# Patient Record
Sex: Male | Born: 1955
Health system: Southern US, Community
[De-identification: ages and names within clinical notes are randomized; demographics above are authoritative.]

## PROBLEM LIST (undated history)

## (undated) DIAGNOSIS — F419 Anxiety disorder, unspecified: Secondary | ICD-10-CM

## (undated) DIAGNOSIS — K635 Polyp of colon: Secondary | ICD-10-CM

## (undated) DIAGNOSIS — G8929 Other chronic pain: Secondary | ICD-10-CM

## (undated) DIAGNOSIS — G9332 Myalgic encephalomyelitis/chronic fatigue syndrome: Secondary | ICD-10-CM

## (undated) DIAGNOSIS — K8020 Calculus of gallbladder without cholecystitis without obstruction: Secondary | ICD-10-CM

## (undated) DIAGNOSIS — J302 Other seasonal allergic rhinitis: Secondary | ICD-10-CM

## (undated) DIAGNOSIS — K219 Gastro-esophageal reflux disease without esophagitis: Secondary | ICD-10-CM

## (undated) DIAGNOSIS — F329 Major depressive disorder, single episode, unspecified: Secondary | ICD-10-CM

## (undated) DIAGNOSIS — E291 Testicular hypofunction: Secondary | ICD-10-CM

## (undated) DIAGNOSIS — K5790 Diverticulosis of intestine, part unspecified, without perforation or abscess without bleeding: Secondary | ICD-10-CM

## (undated) DIAGNOSIS — F32A Depression, unspecified: Secondary | ICD-10-CM

## (undated) DIAGNOSIS — F988 Other specified behavioral and emotional disorders with onset usually occurring in childhood and adolescence: Secondary | ICD-10-CM

## (undated) DIAGNOSIS — K589 Irritable bowel syndrome without diarrhea: Secondary | ICD-10-CM

## (undated) DIAGNOSIS — J449 Chronic obstructive pulmonary disease, unspecified: Secondary | ICD-10-CM

## (undated) DIAGNOSIS — R079 Chest pain, unspecified: Secondary | ICD-10-CM

## (undated) DIAGNOSIS — E785 Hyperlipidemia, unspecified: Secondary | ICD-10-CM

## (undated) DIAGNOSIS — I2510 Atherosclerotic heart disease of native coronary artery without angina pectoris: Secondary | ICD-10-CM

## (undated) DIAGNOSIS — K2980 Duodenitis without bleeding: Secondary | ICD-10-CM

## (undated) DIAGNOSIS — E039 Hypothyroidism, unspecified: Secondary | ICD-10-CM

## (undated) DIAGNOSIS — K648 Other hemorrhoids: Secondary | ICD-10-CM

## (undated) DIAGNOSIS — R5382 Chronic fatigue, unspecified: Secondary | ICD-10-CM

## (undated) DIAGNOSIS — S129XXA Fracture of neck, unspecified, initial encounter: Secondary | ICD-10-CM

## (undated) HISTORY — PX: FOOT SURGERY: SHX648

## (undated) HISTORY — DX: Duodenitis without bleeding: K29.80

## (undated) HISTORY — DX: Other specified behavioral and emotional disorders with onset usually occurring in childhood and adolescence: F98.8

## (undated) HISTORY — DX: Atherosclerotic heart disease of native coronary artery without angina pectoris: I25.10

## (undated) HISTORY — DX: Polyp of colon: K63.5

## (undated) HISTORY — PX: CARPAL TUNNEL RELEASE: SHX101

## (undated) HISTORY — DX: Fracture of neck, unspecified, initial encounter: S12.9XXA

## (undated) HISTORY — PX: OTHER SURGICAL HISTORY: SHX169

## (undated) HISTORY — DX: Testicular hypofunction: E29.1

## (undated) HISTORY — DX: Chronic obstructive pulmonary disease, unspecified: J44.9

## (undated) HISTORY — DX: Gastro-esophageal reflux disease without esophagitis: K21.9

## (undated) HISTORY — DX: Diverticulosis of intestine, part unspecified, without perforation or abscess without bleeding: K57.90

## (undated) HISTORY — DX: Chest pain, unspecified: R07.9

## (undated) HISTORY — DX: Other hemorrhoids: K64.8

## (undated) HISTORY — DX: Calculus of gallbladder without cholecystitis without obstruction: K80.20

## (undated) HISTORY — DX: Other seasonal allergic rhinitis: J30.2

## (undated) HISTORY — PX: ANAL FISSURE REPAIR: SHX2312

## (undated) HISTORY — DX: Other chronic pain: G89.29

## (undated) HISTORY — DX: Myalgic encephalomyelitis/chronic fatigue syndrome: G93.32

## (undated) HISTORY — DX: Hypothyroidism, unspecified: E03.9

## (undated) HISTORY — DX: Chronic fatigue, unspecified: R53.82

## (undated) HISTORY — DX: Depression, unspecified: F32.A

## (undated) HISTORY — PX: CERVICAL DISC SURGERY: SHX588

## (undated) HISTORY — DX: Hyperlipidemia, unspecified: E78.5

## (undated) HISTORY — DX: Irritable bowel syndrome, unspecified: K58.9

## (undated) HISTORY — DX: Major depressive disorder, single episode, unspecified: F32.9

---

## 1998-05-02 ENCOUNTER — Ambulatory Visit (HOSPITAL_COMMUNITY): Admission: RE | Admit: 1998-05-02 | Discharge: 1998-05-02 | Payer: Self-pay | Admitting: Neurosurgery

## 1998-05-02 ENCOUNTER — Encounter: Payer: Self-pay | Admitting: Neurosurgery

## 1999-09-02 ENCOUNTER — Emergency Department (HOSPITAL_COMMUNITY): Admission: EM | Admit: 1999-09-02 | Discharge: 1999-09-02 | Payer: Self-pay | Admitting: Emergency Medicine

## 1999-09-02 ENCOUNTER — Encounter: Payer: Self-pay | Admitting: Emergency Medicine

## 1999-11-15 ENCOUNTER — Emergency Department (HOSPITAL_COMMUNITY): Admission: EM | Admit: 1999-11-15 | Discharge: 1999-11-16 | Payer: Self-pay | Admitting: Emergency Medicine

## 1999-11-16 ENCOUNTER — Encounter: Payer: Self-pay | Admitting: *Deleted

## 2000-05-24 ENCOUNTER — Emergency Department (HOSPITAL_COMMUNITY): Admission: EM | Admit: 2000-05-24 | Discharge: 2000-05-24 | Payer: Self-pay | Admitting: Emergency Medicine

## 2000-05-24 ENCOUNTER — Encounter: Payer: Self-pay | Admitting: Emergency Medicine

## 2002-04-23 ENCOUNTER — Inpatient Hospital Stay (HOSPITAL_COMMUNITY): Admission: EM | Admit: 2002-04-23 | Discharge: 2002-04-23 | Payer: Self-pay

## 2002-05-30 ENCOUNTER — Encounter: Payer: Self-pay | Admitting: Endocrinology

## 2002-05-30 ENCOUNTER — Encounter: Admission: RE | Admit: 2002-05-30 | Discharge: 2002-05-30 | Payer: Self-pay | Admitting: Endocrinology

## 2004-02-03 ENCOUNTER — Ambulatory Visit (HOSPITAL_COMMUNITY): Admission: RE | Admit: 2004-02-03 | Discharge: 2004-02-03 | Payer: Self-pay | Admitting: Neurosurgery

## 2004-03-25 ENCOUNTER — Ambulatory Visit (HOSPITAL_COMMUNITY): Admission: RE | Admit: 2004-03-25 | Discharge: 2004-03-25 | Payer: Self-pay | Admitting: Surgery

## 2004-03-25 ENCOUNTER — Encounter (INDEPENDENT_AMBULATORY_CARE_PROVIDER_SITE_OTHER): Payer: Self-pay | Admitting: Specialist

## 2004-03-25 ENCOUNTER — Ambulatory Visit (HOSPITAL_BASED_OUTPATIENT_CLINIC_OR_DEPARTMENT_OTHER): Admission: RE | Admit: 2004-03-25 | Discharge: 2004-03-25 | Payer: Self-pay | Admitting: Surgery

## 2004-05-11 ENCOUNTER — Encounter (INDEPENDENT_AMBULATORY_CARE_PROVIDER_SITE_OTHER): Payer: Self-pay | Admitting: *Deleted

## 2004-05-11 ENCOUNTER — Ambulatory Visit (HOSPITAL_COMMUNITY): Admission: RE | Admit: 2004-05-11 | Discharge: 2004-05-11 | Payer: Self-pay | Admitting: *Deleted

## 2005-03-06 ENCOUNTER — Ambulatory Visit: Payer: Self-pay | Admitting: Cardiology

## 2005-03-06 ENCOUNTER — Inpatient Hospital Stay (HOSPITAL_COMMUNITY): Admission: EM | Admit: 2005-03-06 | Discharge: 2005-03-08 | Payer: Self-pay | Admitting: Obstetrics & Gynecology

## 2005-03-07 ENCOUNTER — Encounter: Payer: Self-pay | Admitting: Cardiology

## 2005-03-11 ENCOUNTER — Encounter: Admission: RE | Admit: 2005-03-11 | Discharge: 2005-03-11 | Payer: Self-pay | Admitting: Endocrinology

## 2005-07-12 ENCOUNTER — Emergency Department (HOSPITAL_COMMUNITY): Admission: EM | Admit: 2005-07-12 | Discharge: 2005-07-12 | Payer: Self-pay | Admitting: Family Medicine

## 2005-09-12 ENCOUNTER — Ambulatory Visit (HOSPITAL_COMMUNITY): Admission: RE | Admit: 2005-09-12 | Discharge: 2005-09-12 | Payer: Self-pay | Admitting: Anesthesiology

## 2006-02-21 HISTORY — PX: LUMBAR DISC SURGERY: SHX700

## 2006-08-01 ENCOUNTER — Emergency Department (HOSPITAL_COMMUNITY): Admission: EM | Admit: 2006-08-01 | Discharge: 2006-08-01 | Payer: Self-pay | Admitting: Emergency Medicine

## 2006-09-02 ENCOUNTER — Encounter: Admission: RE | Admit: 2006-09-02 | Discharge: 2006-09-02 | Payer: Self-pay | Admitting: Neurosurgery

## 2006-11-17 ENCOUNTER — Inpatient Hospital Stay (HOSPITAL_COMMUNITY): Admission: RE | Admit: 2006-11-17 | Discharge: 2006-11-18 | Payer: Self-pay | Admitting: Neurosurgery

## 2008-02-22 LAB — HM COLONOSCOPY

## 2008-12-10 ENCOUNTER — Encounter: Admission: RE | Admit: 2008-12-10 | Discharge: 2008-12-10 | Payer: Self-pay | Admitting: Endocrinology

## 2008-12-18 ENCOUNTER — Encounter: Admission: RE | Admit: 2008-12-18 | Discharge: 2008-12-18 | Payer: Self-pay | Admitting: Endocrinology

## 2009-10-22 ENCOUNTER — Encounter: Admission: RE | Admit: 2009-10-22 | Discharge: 2009-10-22 | Payer: Self-pay | Admitting: Neurosurgery

## 2010-06-03 ENCOUNTER — Encounter: Payer: Self-pay | Admitting: Internal Medicine

## 2010-06-03 ENCOUNTER — Other Ambulatory Visit (INDEPENDENT_AMBULATORY_CARE_PROVIDER_SITE_OTHER): Payer: Medicare Other | Admitting: Internal Medicine

## 2010-06-03 ENCOUNTER — Ambulatory Visit (INDEPENDENT_AMBULATORY_CARE_PROVIDER_SITE_OTHER): Payer: Medicare Other | Admitting: Internal Medicine

## 2010-06-03 ENCOUNTER — Other Ambulatory Visit (INDEPENDENT_AMBULATORY_CARE_PROVIDER_SITE_OTHER): Payer: Medicare Other

## 2010-06-03 VITALS — BP 126/68 | HR 98 | Temp 98.3°F | Ht 67.0 in | Wt 224.2 lb

## 2010-06-03 DIAGNOSIS — R339 Retention of urine, unspecified: Secondary | ICD-10-CM

## 2010-06-03 DIAGNOSIS — Z0000 Encounter for general adult medical examination without abnormal findings: Secondary | ICD-10-CM

## 2010-06-03 DIAGNOSIS — M109 Gout, unspecified: Secondary | ICD-10-CM | POA: Insufficient documentation

## 2010-06-03 DIAGNOSIS — E119 Type 2 diabetes mellitus without complications: Secondary | ICD-10-CM

## 2010-06-03 DIAGNOSIS — R5382 Chronic fatigue, unspecified: Secondary | ICD-10-CM

## 2010-06-03 DIAGNOSIS — E291 Testicular hypofunction: Secondary | ICD-10-CM

## 2010-06-03 DIAGNOSIS — G8929 Other chronic pain: Secondary | ICD-10-CM | POA: Insufficient documentation

## 2010-06-03 DIAGNOSIS — F988 Other specified behavioral and emotional disorders with onset usually occurring in childhood and adolescence: Secondary | ICD-10-CM | POA: Insufficient documentation

## 2010-06-03 DIAGNOSIS — I2510 Atherosclerotic heart disease of native coronary artery without angina pectoris: Secondary | ICD-10-CM

## 2010-06-03 DIAGNOSIS — E039 Hypothyroidism, unspecified: Secondary | ICD-10-CM

## 2010-06-03 DIAGNOSIS — E785 Hyperlipidemia, unspecified: Secondary | ICD-10-CM

## 2010-06-03 DIAGNOSIS — J449 Chronic obstructive pulmonary disease, unspecified: Secondary | ICD-10-CM

## 2010-06-03 DIAGNOSIS — K589 Irritable bowel syndrome without diarrhea: Secondary | ICD-10-CM

## 2010-06-03 DIAGNOSIS — G4710 Hypersomnia, unspecified: Secondary | ICD-10-CM | POA: Insufficient documentation

## 2010-06-03 DIAGNOSIS — J302 Other seasonal allergic rhinitis: Secondary | ICD-10-CM | POA: Insufficient documentation

## 2010-06-03 DIAGNOSIS — Z125 Encounter for screening for malignant neoplasm of prostate: Secondary | ICD-10-CM

## 2010-06-03 DIAGNOSIS — Z86010 Personal history of colonic polyps: Secondary | ICD-10-CM | POA: Insufficient documentation

## 2010-06-03 DIAGNOSIS — R079 Chest pain, unspecified: Secondary | ICD-10-CM | POA: Insufficient documentation

## 2010-06-03 LAB — URINALYSIS, ROUTINE W REFLEX MICROSCOPIC
Ketones, ur: NEGATIVE
Leukocytes, UA: NEGATIVE
Nitrite: NEGATIVE
Specific Gravity, Urine: 1.025 (ref 1.000–1.030)
pH: 5.5 (ref 5.0–8.0)

## 2010-06-03 LAB — MICROALBUMIN / CREATININE URINE RATIO
Creatinine,U: 173.8 mg/dL
Microalb Creat Ratio: 1.7 mg/g (ref 0.0–30.0)
Microalb, Ur: 3 mg/dL — ABNORMAL HIGH (ref 0.0–1.9)

## 2010-06-03 LAB — LIPID PANEL
HDL: 40.8 mg/dL (ref >39.00)
Total CHOL/HDL Ratio: 6
Triglycerides: 418 mg/dL — ABNORMAL HIGH (ref 0.0–149.0)

## 2010-06-03 LAB — CBC WITH DIFFERENTIAL/PLATELET
Eosinophils Relative: 2.7 % (ref 0.0–5.0)
HCT: 40.3 % (ref 39.0–52.0)
Hemoglobin: 14.2 g/dL (ref 13.0–17.0)
Lymphocytes Relative: 33.5 % (ref 12.0–46.0)
Lymphs Abs: 2.3 10*3/uL (ref 0.7–4.0)
Monocytes Relative: 7.6 % (ref 3.0–12.0)
Platelets: 242 10*3/uL (ref 150.0–400.0)
WBC: 7 10*3/uL (ref 4.5–10.5)

## 2010-06-03 LAB — HEPATIC FUNCTION PANEL
AST: 91 U/L — ABNORMAL HIGH (ref 0–37)
Albumin: 4 g/dL (ref 3.5–5.2)

## 2010-06-03 LAB — HEMOGLOBIN A1C: Hgb A1c MFr Bld: 8.4 % — ABNORMAL HIGH (ref 4.6–6.5)

## 2010-06-03 LAB — BASIC METABOLIC PANEL
CO2: 25 mEq/L (ref 19–32)
Potassium: 4 mEq/L (ref 3.5–5.1)
Sodium: 136 mEq/L (ref 135–145)

## 2010-06-03 LAB — TSH: TSH: 2.53 u[IU]/mL (ref 0.35–5.50)

## 2010-06-03 LAB — PSA: PSA: 0.33 ng/mL (ref 0.10–4.00)

## 2010-06-03 MED ORDER — TAMSULOSIN HCL 0.4 MG PO CAPS: 0.4000 mg | ORAL_CAPSULE | Freq: Every day | ORAL | Status: DC

## 2010-06-03 MED ORDER — TETANUS-DIPHTH-ACELL PERTUSSIS 5-2.5-18.5 LF-MCG/0.5 IM SUSP
0.5000 mL | Freq: Once | INTRAMUSCULAR | Status: AC
  Administered 2010-06-03: 0.5 mL via INTRAMUSCULAR
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
## 2010-06-03 NOTE — Assessment & Plan Note (Signed)
?   OSA - for pulm eval

## 2010-06-03 NOTE — Assessment & Plan Note (Signed)
Last testosterone level low per pt - for re-check today

## 2010-06-03 NOTE — Patient Instructions (Addendum)
Please call for referral for GI evaluation prior to colonoscopy if you are not contacted by Dr Loletta Specter of [facility] Take all new medications as prescribed - the generic flomax for the prostate  Continue all other medications as before You had the tetanus shot today Please go to LAB in the Basement for the blood and/or urine tests to be done today Please call the number on the [facility] (the [employer] [facility]) for results of testing in 2-3 days You will be contacted regarding the referral for: pulmonary for possible sleep apnea Please return in 6 mo with Lab testing done 3-5 days before

## 2010-06-03 NOTE — Assessment & Plan Note (Signed)
Last a1c per endo borderline elev - stable overall by hx and exam, most recent lab reviewed with pt, and pt to continue medical treatment as before  , for lab f/u today

## 2010-06-03 NOTE — Assessment & Plan Note (Signed)
Overall doing well, age appropriate education and counseling updated, referrals for preventative services and immunizations addressed, dietary and smoking counseling addressed, most recent labs and ECG reviewed.  I have personally reviewed and have noted: 1) the patient's medical and social history 2) The pt's use of alcohol, tobacco, and illicit drugs 3) The patient's current medications and supplements 4) Functional ability including ADL's, fall risk, home safety risk, hearing and visual impairment 5) Diet and physical activities 6) Evidence for depression or mood disorder 7) The patient's height, weight, and BMI have been recorded in the chart I have made referrals, and provided counseling and education based on review of the above For tetanus update today

## 2010-06-03 NOTE — Assessment & Plan Note (Signed)
prob bph most likely, for ua and psa above, also for trial flomax

## 2010-06-04 ENCOUNTER — Other Ambulatory Visit: Payer: Self-pay

## 2010-06-04 LAB — TESTOSTERONE, FREE, TOTAL, SHBG: Testosterone: 112.5 ng/dL — ABNORMAL LOW (ref 250–890)

## 2010-06-04 MED ORDER — PRAVASTATIN SODIUM 40 MG PO TABS: 40.0000 mg | ORAL_TABLET | Freq: Every evening | ORAL | Status: DC

## 2010-06-04 MED ORDER — PIOGLITAZONE HCL 30 MG PO TABS: 30.0000 mg | ORAL_TABLET | Freq: Every day | ORAL | Status: DC

## 2010-06-22 ENCOUNTER — Encounter: Payer: Self-pay | Admitting: Internal Medicine

## 2010-06-24 ENCOUNTER — Encounter: Payer: Self-pay | Admitting: Pulmonary Disease

## 2010-06-24 ENCOUNTER — Ambulatory Visit (INDEPENDENT_AMBULATORY_CARE_PROVIDER_SITE_OTHER): Payer: Medicare Other | Admitting: Pulmonary Disease

## 2010-06-24 VITALS — BP 118/66 | HR 78 | Temp 98.4°F | Ht 67.0 in | Wt 226.0 lb

## 2010-06-24 DIAGNOSIS — G4710 Hypersomnia, unspecified: Secondary | ICD-10-CM

## 2010-06-24 NOTE — Patient Instructions (Signed)
Will set up for a sleep study, and arrange followup once results available

## 2010-06-24 NOTE — Progress Notes (Signed)
  Subjective:    Patient ID: Stephen Herrera, male    DOB: 1955/04/15, 55 y.o.   MRN: [id number]  HPI The pt is a 55y/o male who I have been asked to see for possible osa.  His history is significant for: -snoring, but no one has mentioned an abnormal breathing pattern during sleep. Denies gasping arousals -very fragmented and restless sleep, nonrestorative sleep. -sleep pressure noted during day with periods of inactivity -takes catnap in am's of 1-2 hours -dozes with tv and inactivity in the evening hours.  -weight up 10 pounds past 2 yrs, epworth 15  Sleep Questionnaire:   What time do you typically go to bed?( Between what hours)  11 pm to 2 am         How long does it take you to fall asleep?  10 mins         How many times during the night do you wake up?           What time do you get out of bed to start your day?  0800         Do you drive or operate heavy machinery in your occupation?  No         How much has your weight changed (up or down) over the past two years? (In pounds)  10 lb (4.536 kg)         Have you ever had a sleep study before?   No         Do you currently use CPAP?  No         Do you wear oxygen at any time?  No              Review of Systems  Constitutional: Negative for fever and unexpected weight change.  HENT: Negative for ear pain, nosebleeds, congestion, sore throat, rhinorrhea, sneezing, trouble swallowing, dental problem, postnasal drip and sinus pressure.   Eyes: Negative for redness and itching.  Respiratory: Positive for shortness of breath. Negative for cough, chest tightness and wheezing.   Cardiovascular: Negative for palpitations and leg swelling.  Gastrointestinal: Negative for nausea and vomiting.  Genitourinary: Negative for dysuria.  Musculoskeletal: Positive for joint swelling.  Skin: Negative for rash.  Neurological: Negative for headaches.  Hematological: Does not bruise/bleed easily.  Psychiatric/Behavioral: Negative for dysphoric  mood. The patient is not nervous/anxious.        Objective:   Physical Exam Constitutional:  Well developed, no acute distress  HENT:  Nares with deviated septum to left with near obstruction.  Right narrowed also  Oropharynx without exudate, palate and uvula are elongated  Eyes:  Perrla, eomi, no scleral icterus  Neck:  No JVD, no TMG  Cardiovascular:  Normal rate, regular rhythm, no rubs or gallops.  No murmurs        Intact distal pulses  Pulmonary :  Normal breath sounds, no stridor or respiratory distress   No rales, rhonchi, or wheezing  Abdominal:  Soft, nondistended, bowel sounds present.  No tenderness noted.   Musculoskeletal:  No lower extremity edema noted.  Lymph Nodes:  No cervical lymphadenopathy noted  Skin:  No cyanosis noted  Neurologic:  Alert, appropriate, moves all 4 extremities without obvious deficit.        Assessment & Plan:

## 2010-06-28 ENCOUNTER — Encounter: Payer: Self-pay | Admitting: Pulmonary Disease

## 2010-06-28 NOTE — Assessment & Plan Note (Signed)
The pt has significant daytime hypersomnia by his history, and describes very restless and nonrestorative sleep.  He does snore, is overweight, and has an abnormal upper airway anatomy.  This is most suggestive of sleep disordered breathing, although there are many other things which could do the same thing.  At this point, the pt feels it interferes with his QOL, and will need a sleep study for work-up.
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
## 2010-07-06 NOTE — Op Note (Signed)
NAMEANOOP, HEMMER                ACCOUNT NO.:  [id number]   MEDICAL RECORD NO.:  [id number]          PATIENT TYPE:  INP   LOCATION:  3020                         FACILITY:  [facility]   PHYSICIAN:  Payton Doughty, M.D.      DATE OF BIRTH:  1955-12-12   DATE OF PROCEDURE:  11/17/2006  DATE OF DISCHARGE:                               OPERATIVE REPORT   PREOPERATIVE DIAGNOSIS:  Herniated disk, L4-5, on the left.   POSTOPERATIVE DIAGNOSIS:  Herniated disk, L4-5, on the left.   OPERATIVE PROCEDURE:  Left L4-5 laminectomy and diskectomy.   SURGEON:  Payton Doughty, M.D.   ANESTHESIA:  General endotracheal.   PREP:  Sterile Betadine prep and scrub with alcohol wipe.   COMPLICATIONS:  None.   NURSE ASSISTANCE:  [place].   DOCTOR ASSISTANCE:  Hilda Lias, M.D.   BODY OF TEXT:  A 55 year old gentleman with a left L5 radiculopathy  related to disk.  He was taken to operative room and smoothly  anesthetized and intubated, placed prone on the operating table.  Following shave, prep, and drape in usual sterile fashion, skin was  infiltrated with 1% lidocaine with 1:400,000 epinephrine.  Incision was  made over the L4 lamina.  It was dissected in subperiosteal plane.  Intraoperative x-rays showed marker under L3.  Laminectomy was carried  out at the next level below that.  McCullough retractor was placed, and  the high-speed drill was used to create a hemi-semi-laminectomy up the  top of ligamentum flavum.  It was removed in retrograde fashion.  The L5  root was identified and retracted medially, exposing the disk space.  The annular fibers were divided and the disk removed.  This resulted in  decompression of 5 root as it traversed this area.  All graspable  fragments were removed and the end plates gently curetted to remove any  marginally clinging fragments.  Following complete decompression and  exploration of the neural foramina, wound was irrigated and hemostasis  assured.  The  laminectomy defect was filled with Depo-Medrol soaked fat.  Successive layers of 0 Vicryl, 2-0 Vicryl, and 4-0 Vicryl were used to  close.  Benzoin and Steri-Strips were placed and made occlusive with  Telfa and OpSite.  The patient returned to the recovery room in good  condition.    .           ______________________________  Payton Doughty, M.D.     MWR/MEDQ  D:  11/17/2006  T:  11/17/2006  Job:  [id number]

## 2010-07-09 NOTE — Op Note (Signed)
Stephen Herrera, Stephen Herrera                ACCOUNT NO.:  [id number]   MEDICAL RECORD NO.:  [id number]          PATIENT TYPE:  AMB   LOCATION:  DAY                          [facility]   PHYSICIAN:  Vikki Ports, MDDATE OF BIRTH:  1955/08/22   DATE OF PROCEDURE:  05/11/2004  DATE OF DISCHARGE:                                 OPERATIVE REPORT   PREOPERATIVE DIAGNOSES:  1.  Fistula in ano.  2.  Internal hemorrhoids with prolapse.   POSTOPERATIVE DIAGNOSES:  1.  Fistula in ano.  2.  Internal hemorrhoids with prolapse.   PROCEDURE:  Fistulotomy and procedure for prolapsing hemorrhoids, rectopexy.   SURGEON:  Vikki Ports, M.D.   ANESTHESIA:  General anesthesia.   DESCRIPTION OF PROCEDURE:  Patient was taken to the operating room and  placed in the supine position.  After adequate general anesthesia was  induced using endotracheal tube, patient was placed in the prone jackknife  position.  Rectal and perianal prep were undertaken.  The fistula tract  which was in the anterior midline was identified, was very superficial and  not under the sphincter muscle.  It was probed and sphincterotomy was  performed using Bovie electrocautery.  Adequate hemostasis was insured.  Area of the chronic inflammation was incised of the anoderm.   I then turned my attention to the internal rectal mucosa and a 2-0 Prolene  pursestring stitch was placed approximately 5 cm proximal to the dentate  line.  PPH stapler was placed in the rectum.  Pursestring was tied down. The  stapler was closed, held in place for one minute and fired.  I had a good  rim of 2 cm of hemorrhoidal tissue.  No evidence of muscularis.  Staple line  was inspected.  There was no bleeding.  Gelfoam packing was placed.  Internal and external sphincters were injected with 0.5 Marcaine as was the  fistulotomy site.  The patient tolerated the procedure well and went to PACU  in good condition.      KRH/MEDQ  D:   05/11/2004  T:  05/11/2004  Job:  [id number]

## 2010-07-09 NOTE — H&P (Signed)
NAME:  Stephen Herrera, Stephen Herrera NO.:  [id number]   MEDICAL RECORD NO.:  [id number]          PATIENT TYPE:  INP   LOCATION:  1404                         FACILITY:  [facility]   PHYSICIAN:  Lorain Childes, M.D. LHCDATE OF BIRTH:  06-25-1955   DATE OF ADMISSION:  03/06/2005  DATE OF DISCHARGE:                                HISTORY & PHYSICAL   PRIMARY CARE PHYSICIAN:  __________   CARDIOLOGIST:  Rollene Rotunda, M.D.   CHIEF COMPLAINT:  Chest pain.   HISTORY OF PRESENT ILLNESS:  The patient is a 55 year old gentleman with a  history of nonobstructing coronary disease who presents to the ER for  evaluation of chest pain. The patient reports the onset of chest pain at 6  p.m. today.  He describes it as sharp and __________ on the right side of  his chest radiating __________ his chest. He reports associated shortness of  breath; denies any nausea, vomiting or diaphoresis, palpitations, or  syncope. He reports that the pain was 5/10 and persistent so he came to the  emergency room for further evaluation. He received three sublingual  nitroglycerins with no relief of his pain and was started on a nitroglycerin  drip. His pain is now a 2/10 to 3/10 currently. He denies any shortness of  breath. He denies any recent chest discomfort prior to today. His last  episode of chest pain was a couple of years ago for which he underwent a  cardiac catheterization and was found to have nonobstructing coronary  disease. He denies any recent upper respiratory symptoms, no coughing. No  recent travel and no lower extremity edema. No worsening of his  gastroesophageal reflux symptoms. He reports that his pain is similar to  prior episodes for which he underwent a cardiac catheterization, however,  this time it did not radiate to his arm.   PAST MEDICAL HISTORY:  1.  Chest pain, status post cardiac catheterization on April 23, 2002. He was      found to have left main was normal, LAD had 25%  stenosis, his circumflex      was normal, his RCA had minor irregularities proximally. His EF was      noted to be 65%.  2.  Dyslipidemia with hypertriglyceridemia.  3.  Diabetes.  4.  Tobacco abuse, ongoing.  5.  Spinal stenosis with chronic back pain.  6.  External hemorrhoids, status post surgery.  7.  Status post right carpal tunnel release surgery.  8.  Status post anal fistulotomy and repair of prolapsed hemorrhoids.   MEDICATIONS:  1.  Vicodin 10/650 mg 1 tablet p.o. q.8 h. p.r.n.  2.  Nexium.  3.  TriCor unknown dose.  4.  Crestor 5 mg daily.  5.  Actos unknown dose.  6.  Aspirin 81 mg daily.   ALLERGIES:  HE HAS NO KNOWN DRUG ALLERGIES.   SOCIAL HISTORY:  He lives in [place]. He is divorced. He has a 30-pack-  year history of smoking which is ongoing. He denies any alcohol. No drugs.  No herbal medications. He follows a very low-cholesterol diet.  He does do  some exercise with walking.   FAMILY HISTORY:  His mother had a brain tumor which was found to be cancer.  Father died from complications related to diabetes, he had a CABG in his  52s. His siblings are healthy without any coronary disease.   REVIEW OF SYSTEMS:  He denies any fevers, chills. No sweats, no weight  changes. He has occasional headaches which are relieved with Tylenol. No  visual changes. No hearing loss. No focal weakness. No numbness. No urinary  symptoms. No nausea, vomiting, diarrhea. No bright-red blood per rectum, no  melena. No change in his bowel habits. All other systems are negative.   PHYSICAL EXAMINATION:  VITAL SIGNS:  Temperature 97.5, pulse 69, respiration  20, blood pressure 115/61, he is saturating 96% on room air.  GENERAL:  He is a pleasant man, comfortable, in no acute distress.  HEENT:  Normocephalic, atraumatic. Oropharynx is clear.  NECK:  His JVP is approximately 6 cm. He has 2+ carotid upstroke. There are  no bruits.  CARDIOVASCULAR:  He has a normal S1, split S2, regular  rate and rhythm. He  has no murmurs appreciated. His PMI is non-displaced. His pulses are 2+  throughout and equal. There are no bruits.  LUNGS:  Clear to auscultation bilaterally; no wheezing, rales or rhonchi.  ABDOMEN:  Soft, positive bowel sounds, nontender, no organomegaly.  EXTREMITIES:  Have no edema. He has 2+ distal pulses.  NEUROLOGIC:  Nonfocal.   Chest x-ray shows no acute airspace disease. EKG shows a rate of 83, sinus  rhythm, normal axis, normal intervals, he has a Q wave noted in lead III and  a very slight one noted in aVF. He has no ST changes, no ischemic changes,  no hypertrophy. He has no change from prior EKG dated February 02, 2004.   LABORATORY:  His white count is 7.5, hematocrit 42.7, platelets 242.  Potassium 3.9, creatinine 1.0, glucose 93. His CK-MB is 1.4, troponin less  than 0.05, myoglobin is 51.9.   ASSESSMENT AND PLAN:  The patient is a 55 year old gentleman with  nonobstructive coronary disease, who presents for evaluation of chest pain.   Problem 1.  Chest pain. It is atypical in nature for cardiac etiology,  however, he does have risk factors and could have had progression of his  disease since his last catheterization about 3 years ago. We will monitor  him on telemetry, cycle his cardiac enzymes and follow his EKG. Will plan  for a stress test tomorrow. For medical management we will treat him with  Lovenox, continue his nitroglycerin drip for now, will start a beta-blocker  and treat his lipids. I have strongly encouraged smoking cessation and will  place a consult for this. Lastly, we will check a D-dimer to assess for  possible pulmonary embolus as a cause of his atypical chest pain.   Problem 2.  Gastroesophageal reflux disease. We will continue him on a  proton pump inhibitor. We will start Protonix during the hospitalization.  Problem 3.  Diabetes. We will continue him on his Actos and cover him with  sliding scale insulin.   Problem 4.   Dyslipidemia. I have written him for a statin and TriCor. His  dose does need to be clarified as he does not know these currently.           ______________________________  Lorain Childes, M.D. [facility]     CGF/MEDQ  D:  03/06/2005  T:  03/07/2005  Job:  [id number]

## 2010-07-09 NOTE — Discharge Summary (Signed)
NAME:  Stephen Herrera, Stephen Herrera                ACCOUNT NO.:  [id number]   MEDICAL RECORD NO.:  [id number]          PATIENT TYPE:  INP   LOCATION:  1404                         FACILITY:  [facility]   PHYSICIAN:  Thomas C. Wall, M.D.   DATE OF BIRTH:  1955/10/10   DATE OF ADMISSION:  03/06/2005  DATE OF DISCHARGE:  03/08/2005                                 DISCHARGE SUMMARY   PRIMARY CARDIOLOGIST:  Rollene Rotunda, M.D.   PRINCIPAL DIAGNOSIS:  1.  Noncardiac chest pain.      1.  Normal exercise stress Myoview, ejection fraction 58%.   SECONDARY DIAGNOSES:  1.  Nonobstructive coronary artery disease.      1.  Cardiac catheterization, March, 2004.      2.  Normal left ventricular function.  2.  Mixed dyslipidemia.  3.  Type 2 diabetes mellitus.  4.  Ongoing tobacco.  5.  Spinal stenosis/chronic back pain.   PROCEDURES:  Exercise stress Myoview, March 15th.   REASON FOR ADMISSION:  Patient is a 54 year old male with a history of  nonobstructive CAD who presented to the emergency room with a complaint of  sharp right-sided chest pain.   HOSPITAL COURSE:  The patient ruled out for myocardial infarction with all  serial cardiac markers within normal limits.  Additionally, D-dimer was  normal.  The patient was referred for an exercise stress Myoview, which  revealed no exercise-induced ischemia, EF 58%.   The patient was treated with analgesics during his brief stay and discharged  home on Advil.  He was to otherwise resume all previous home medications.   Of note, at the time of discharge, Dr. Daleen Squibb suggested consideration of a CT  scan of the chest if the patient were to have persistent chest pain.   LABORATORY DATA:  Normal serial cardiac markers, D-dimer less than 0.22.  BNP less than 30.  Lipid profile:  Total cholesterol 226, triglycerides 438,  HDL 31, LDL not calculated, TSH 2.41.  CBC normal.  Electrolytes, renal  function, and liver enzymes normal.  Serum glucose 101, hemoglobin A1C  5.9.   ADMISSION CHEST X-RAY:  NAD.   DISCHARGE MEDICATIONS:  1.  Aspirin 81 daily.  2.  Crestor 5 daily.  3.  Actos as previously directed.  4.  Tricor as previously directed.  5.  Nexium 1 tablet daily.  6.  Vicodin 10/650 p.r.n.  7.  Advil 400-600 mg q.i.d. p.r.n.   FOLLOW UP:  Patient is instructed to arrange followup with Dr. Corrin Parker.   DISCHARGE DURATION TIME:  Forty minutes.      Gene Serpe, P.A. [facility]      Thomas C. Wall, M.D.  Electronically Signed    GS/MEDQ  D:  05/12/2005  T:  05/13/2005  Job:  [id number]   cc:   Alfonse Alpers. Dagoberto Ligas, M.D.  Fax: [phone]

## 2010-07-09 NOTE — Op Note (Signed)
NAME:  Stephen Herrera, Stephen Herrera                ACCOUNT NO.:  [id number]   MEDICAL RECORD NO.:  [id number]          PATIENT TYPE:  AMB   LOCATION:  NESC                         FACILITY:  [facility]   PHYSICIAN:  Currie Paris, M.D.DATE OF BIRTH:  1955-07-14   DATE OF PROCEDURE:  DATE OF DISCHARGE:                                 OPERATIVE REPORT   OFFICE MEDICAL RECORD NUMBER:  [phone]   PREOPERATIVE DIAGNOSES:  1.  Residual thrombosed external hemorrhoid and skin tag.  2.  Hemorrhoids.   POSTOPERATIVE DIAGNOSES:  1.  Residual thrombosed external hemorrhoid and skin tag.  2.  Hemorrhoids.   OPERATION:  Examination under anesthesia with excision of residual  thrombosed hemorrhoids/skin tag.   SURGEON:  Currie Paris, M.D.   ASSISTANT:  Kizzie Furnish, P.A. student   ANESTHESIA:  General.   CLINICAL HISTORY:  This patient had a thrombosed hemorrhoid drained in the  office several weeks ago and has a residual area that has continued to  drain.  On exam, there was still what appeared to be a residual portion of  old clot and skin tag.  The patient was having significant problems keeping  this area clean, and we decided to excise that.   DESCRIPTION OF PROCEDURE:  The patient was taken to the holding area.  He  had no further questions.  He was taken to the operating room.  After  satisfactory general anesthesia was obtained, he was placed in the lithotomy  position.  The perineal area was prepped and draped.  A timeout was done.   The anoscope was introduced, and he did have fairly significant internal  hemorrhoid disease.  However, this so far has not been symptomatic.  He had  an external skin tag with, I think, a little bit of necrotic tissue within  it from where the thrombosed hemorrhoid was anteriorly.  I injected about 10  mL of 0.25% plain Marcaine in the area where the skin tag was and into the  sphincter muscle.  We waited about 10 minutes, and then I did an  elliptical  incision to excise the skin tag and a little bit of the anal mucosa.  Bleeding was controlled with cautery, and this  incision was closed with running 4-0 Vicryl.  Everything appeared to be dry.  A little packing was placed in the rectum with Gelfoam, and sterile dressing  was applied.  The patient tolerated the procedure well.  There were no  operative complications.  All counts were correct.      CJS/MEDQ  D:  03/25/2004  T:  03/25/2004  Job:  [id number]   cc:   Alfonse Alpers. Dagoberto Ligas, M.D.  [address]  [place]  [place] [address]  Fax: [phone]

## 2010-07-09 NOTE — Cardiovascular Report (Signed)
   NAME:  Stephen Herrera, Stephen Herrera                          ACCOUNT NO.:  [id number]   MEDICAL RECORD NO.:  [id number]                   PATIENT TYPE:  INP   LOCATION:  6533                                 FACILITY:  [facility]   PHYSICIAN:  Rollene Rotunda, M.D. [facility]            DATE OF BIRTH:  1955/03/24   DATE OF PROCEDURE:  04/23/2002  DATE OF DISCHARGE:                              CARDIAC CATHETERIZATION   PRIMARY CARE PHYSICIAN:  Alfonse Alpers. Dagoberto Ligas, M.D.   PROCEDURE:  Left heart catheterization/coronary arteriography.   INDICATIONS:  Evaluate patient with chest pain consistent with unstable  angina and significant cardiovascular risk factors.   DESCRIPTION OF PROCEDURE:  Left heart catheterization was performed via the  right femoral artery. The artery was cannulated using the anterior wall  puncture. A #6 French arterial sheath was inserted via the modified  Seldinger technique. Preformed Judkins and a pigtail catheter were utilized.  The patient tolerated the procedure well and left the lab in stable  condition.   HEMODYNAMICS:  LV 118/14, AO 116/85.   CORONARY ARTERIES:  1. The left main was normal.  2. The LAD had mid 25% stenosis.  3. The circumflex included a very large mid obtuse marginal.  It was normal.  4. The right coronary artery had proximal luminal irregularities.  The right     coronary was dominant.   LEFT VENTRICULOGRAM:  A left ventriculogram was obtained in the RAO  projection.  The EF was 65%.   CONCLUSIONS:  Minimal coronary artery plaque.  Normal left ventricular  function.   PLAN:  No further cardiac work-up is suggested.  The patient will follow  with Dr. Dagoberto Ligas for primary risk reduction.   Of note, the patient had vascular hemostasis achieved via the Matrix  protocol.  Polymer gel was used for hemostasis and was done without  complications after the patient was signed appropriately informed consent.         Rollene Rotunda, M.D. [facility]    JH/MEDQ  D:  04/23/2002  T:  04/24/2002  Job:  [id number]   cc:   Alfonse Alpers. Dagoberto Ligas, M.D.  [address], [address]  [place]  [place] [address]  Fax: [phone]

## 2010-07-09 NOTE — H&P (Signed)
NAME:  [name], [name]                          ACCOUNT NO.:  [id number]   MEDICAL RECORD NO.:  [id number]                   PATIENT TYPE:  INP   LOCATION:  6533                                 FACILITY:  [facility]   PHYSICIAN:  Jesse Sans. Wall, M.D. [facility]            DATE OF BIRTH:  1956/01/25   DATE OF ADMISSION:  04/22/2002  DATE OF DISCHARGE:                                HISTORY & PHYSICAL   CHIEF COMPLAINT:  Aching in my shoulder and arm while watching television  with pressure and tightness in my chest and breaking out in a sweat.   HISTORY OF PRESENT ILLNESS:  Mr. Stephen Herrera is a 55 year old white male who  presents with the above complaints.  When he got to the emergency room,  after about an hour and a half from the onset of pain, nitroglycerin  relieved it.  He has had some reflux symptoms in the past.  He takes p.r.n.  Zantac.  He said this was not his reflux.   His cardiac risk factors include male sex, age, hyperlipidemia with  triglyceride counts in the 700s, question of borderline diabetes and tobacco  use for 22 years, which he quit New Year's Day.  He does not exercise.  He  is overweight.   PAST MEDICAL HISTORY:  He has history of spinal stenosis and takes p.r.n.  Vicodin for this.   ALLERGIES:  No known drug allergies.   PAST SURGICAL HISTORY:  He has had no previous surgeries.   FAMILY HISTORY:  Remarkable for his father having coronary bypass surgery in  his 8s.   REVIEW OF SYSTEMS:  Other than the HPI, negative.  He has no GI complaints,  no melena, hematochezia, and hematemesis.   PHYSICAL EXAMINATION:  VITAL SIGNS:  His exam this morning shows a blood  pressure of 110/65, pulse 82 and regular, respirations 20 and unlabored, O2  saturation is 96% on room air, he is afebrile.  GENERAL:  He is a very pleasant gentleman currently in no acute distress.  He is on IV nitroglycerin and heparin.  HEENT:  Unremarkable.  NECK:  Carotid upstrokes are equal bilaterally  without bruits.  No JVD.  Thyroid is not enlarged.  Trachea midline.  HEART:  Nondisplaced PMI.  Normal S1 and S2 without murmur, rub, or gallop.  LUNGS:  Clear to auscultation and percussion.  ABDOMEN:  Soft, good bowel sounds.  No midline bruit.  No tenderness.  EXTREMITIES:  Good distal pulses, 3+/4+.  There is no cyanosis, clubbing or  edema.  NEUROLOGIC:  Intact.   LABORATORY DATA:  His baseline laboratory data show glucose 140 which is  nonfasting, BUN 25, creatinine 1.2, sodium 136, potassium 3.8.  CBC is  normal.  His first troponin and CPK-MB are negative.  I do not see a protime  or PTT which were ordered.   EKG is essentially normal.   Chest x-ray was  done but no report in the chart.  He is now up on 6500 and I  will have the report generated.   ASSESSMENT:  1. Chest discomfort consistent with unstable angina or coronary ischemia.     He has multiple risk factors.  Cardiac catheterization has been     recommended and explained.  He agrees to proceed today with left     __________ possible.  2. Check lipid panel in the morning.  3. Check PT/PTT before catheterization.  4. Obtain chest x-ray.  5. Fasting blood sugar, hemoglobin A1c in the morning.  6. Notify Dr. Dagoberto Ligas.                                               Thomas C. Daleen Squibb, M.D. [facility]    TCW/MEDQ  D:  04/23/2002  T:  04/23/2002  Job:  [id number]   cc:   Alfonse Alpers. Dagoberto Ligas, M.D.  [address], [address]  [place]  [place] [address]  Fax: [phone]

## 2010-07-09 NOTE — Discharge Summary (Signed)
NAME:  DREDEN, RIVERE                          ACCOUNT NO.:  [id number]   MEDICAL RECORD NO.:  [id number]                   PATIENT TYPE:  INP   LOCATION:  2854                                 FACILITY:  [facility]   PHYSICIAN:  Rollene Rotunda, M.D. [facility]            DATE OF BIRTH:  1955-06-06   DATE OF ADMISSION:  04/22/2002  DATE OF DISCHARGE:  04/23/2002                           DISCHARGE SUMMARY - REFERRING   BRIEF HISTORY:  The patient is a 55 year old male who presented with aching  in his shoulder and arm, developing chest pressure and tightness, associated  with diaphoresis while watching TV.  He decided to come to the emergency  room after about an hour and one-half, and he received nitroglycerin which  relieved the discomfort.  He also describes some reflux symptoms he has had  in the past and feels that these symptoms are not similar to his reflux.  His history is notable for hyperlipidemia with triglycerides count in the  700s, questionable borderline diabetes, obesity, and tobacco use which he  quit New Year's day.   LABORATORY DATA:  In the ER, hemoglobin 15, hematocrit 45.  Sodium 136,  potassium 3.8, BUN 25, creatinine 1.2.  CK 103, MB 1.7, troponin 0.02.   EKG shows normal sinus rhythm, nonspecific ST-T wave changes.   HOSPITAL COURSE:  The patient was admitted to 5500 by Dr. Daleen Squibb.  It was felt  the patient should undergo cardiac catheterization.  On the morning of  04/23/2002, Dr. Antoine Poche performed cardiac catheterization which revealed  normal left main, 25% LAD, large OM, proximal luminal irregularities, and  RCA which was dominant.  His EF was 65%.  It was felt he had minimal  coronary artery plaque with normal LV function and did not need any further  cardiac workup.  Dr. Antoine Poche suggested he follow up with Dr. Dagoberto Ligas in  regard to the lipids and hemoglobin A1C that are pending at the time of this  dictation.   DISCHARGE DIAGNOSES:  1. Noncardiac chest  discomfort.  2. Multiple cardiac risk factors as previous described.   DISPOSITION:  The patient is discharged home.  He will follow up on  Thursday, April 1, at 2 p.m. at [facility] because he  is entered into the [employer] study.  He as asked to call to make one  to two-week followup appointment with Dr. Dagoberto Ligas in regard to his lipids and  hemoglobin A1C that are still pending at the time of discharge.  He was  advised no lifting, driving, sexual activity, or heavy exertion for two  days.  Maintain low-fat, low-salt, low-cholesterol diet.  If he had any  problems with his catheterization site, he was asked to call us immediately.    ADDENDUM:  Prior to discharge, fasting lipids were completed.  They were  noted to be total cholesterol 179, triglycerides 457, HDL 38, LDL not  calculated.  Hemoglobin A1C  was also 5.8.     Joellyn Rued, P.A. [facility]                    Rollene Rotunda, M.D. [facility]    EW/MEDQ  D:  04/23/2002  T:  04/23/2002  Job:  [id number]   cc:   Alfonse Alpers. Dagoberto Ligas, M.D.  [address], [address]  [place]  [place] [address]  Fax: [phone]

## 2010-07-09 NOTE — Op Note (Signed)
NAMEEUSTACE, HUR NO.:  [id number]   MEDICAL RECORD NO.:  [id number]          PATIENT TYPE:  OIB   LOCATION:  2899                         FACILITY:  [facility]   PHYSICIAN:  Payton Doughty, M.D.      DATE OF BIRTH:  19-Feb-1956   DATE OF PROCEDURE:  02/03/2004  DATE OF DISCHARGE:                                 OPERATIVE REPORT   PREOPERATIVE DIAGNOSIS:  Right carpal tunnel syndrome.   POSTOPERATIVE DIAGNOSIS:  Right carpal tunnel syndrome.   OPERATION PERFORMED:  Right carpal tunnel release.   SURGEON:  Payton Doughty, M.D.   ANESTHESIA:  1% local lidocaine.   COMPLICATIONS:  None.   ASSISTANT:  None.   INDICATIONS FOR PROCEDURE:  The patient is a 55 year old gentleman with  right carpal tunnel syndrome.   DESCRIPTION OF PROCEDURE:  The patient was taken to the operating room.  After shave, prep and drape in the usual sterile fashion had 1% local  lidocaine injection paralleling between the second and third rays of the  right hand 1 cm distal to the distal wrist crease.  Incision was made over  this area.  Palmar fat was dissected and the transverse carpal ligament  identified.  It was divided sharply down to the level of the nerve.  Working  just proximally along the wrist and distally on the hand, the transverse  carpal ligament was divided to allow complete decompression of the right  medial nerve.  The tunnel was quite tight, thickened, particularly right at  the wrist crease.  Following complete release the wound was irrigated and  hemostasis assured and it was closed in a single layer with 3-0 nylon in  interrupted vertical mattress fashion.  Patient was placed in standard hand  wrap and returned to recovery room.       MWR/MEDQ  D:  02/03/2004  T:  02/03/2004  Job:  [id number]

## 2010-07-15 ENCOUNTER — Other Ambulatory Visit: Payer: Self-pay | Admitting: Internal Medicine

## 2010-07-15 DIAGNOSIS — E291 Testicular hypofunction: Secondary | ICD-10-CM

## 2010-07-16 ENCOUNTER — Ambulatory Visit
Admission: RE | Admit: 2010-07-16 | Discharge: 2010-07-16 | Disposition: A | Discharge status: Home / Self Care | Payer: Medicare Other | Source: Ambulatory Visit | Attending: Internal Medicine | Admitting: Internal Medicine

## 2010-07-16 ENCOUNTER — Other Ambulatory Visit (INDEPENDENT_AMBULATORY_CARE_PROVIDER_SITE_OTHER): Payer: Medicare Other

## 2010-07-16 ENCOUNTER — Other Ambulatory Visit (INDEPENDENT_AMBULATORY_CARE_PROVIDER_SITE_OTHER): Payer: Medicare Other | Admitting: Internal Medicine

## 2010-07-16 ENCOUNTER — Other Ambulatory Visit: Payer: Self-pay | Admitting: Internal Medicine

## 2010-07-16 DIAGNOSIS — Z79899 Other long term (current) drug therapy: Secondary | ICD-10-CM

## 2010-07-16 DIAGNOSIS — E119 Type 2 diabetes mellitus without complications: Secondary | ICD-10-CM

## 2010-07-16 DIAGNOSIS — E7800 Pure hypercholesterolemia, unspecified: Secondary | ICD-10-CM

## 2010-07-16 DIAGNOSIS — E291 Testicular hypofunction: Secondary | ICD-10-CM

## 2010-07-16 DIAGNOSIS — IMO0001 Reserved for inherently not codable concepts without codable children: Secondary | ICD-10-CM

## 2010-07-16 DIAGNOSIS — Z13220 Encounter for screening for lipoid disorders: Secondary | ICD-10-CM

## 2010-07-16 LAB — LIPID PANEL
HDL: 45.7 mg/dL (ref >39.00)
Total CHOL/HDL Ratio: 5
Triglycerides: 278 mg/dL — ABNORMAL HIGH (ref 0.0–149.0)
VLDL: 55.6 mg/dL — ABNORMAL HIGH (ref 0.0–40.0)

## 2010-07-16 LAB — HEPATIC FUNCTION PANEL
Albumin: 4 g/dL (ref 3.5–5.2)
Alkaline Phosphatase: 57 U/L (ref 39–117)
Total Protein: 7.4 g/dL (ref 6.0–8.3)

## 2010-07-16 LAB — BASIC METABOLIC PANEL
Calcium: 9 mg/dL (ref 8.4–10.5)
GFR: 93.85 mL/min (ref >60.00)
Glucose, Bld: 147 mg/dL — ABNORMAL HIGH (ref 70–99)
Sodium: 136 mEq/L (ref 135–145)

## 2010-07-16 MED ORDER — GADOBENATE DIMEGLUMINE 529 MG/ML IV SOLN
10.0000 mL | Freq: Once | INTRAVENOUS | Status: AC | PRN
  Administered 2010-07-16: 10 mL via INTRAVENOUS

## 2010-07-20 ENCOUNTER — Other Ambulatory Visit: Payer: Self-pay | Admitting: Internal Medicine

## 2010-07-20 LAB — TESTOSTERONE, FREE, TOTAL, SHBG
Sex Hormone Binding: 22 nmol/L (ref 13–71)
Testosterone, Free: 53 pg/mL (ref 47.0–244.0)
Testosterone: 222.83 ng/dL — ABNORMAL LOW (ref 250–890)

## 2010-07-20 MED ORDER — PIOGLITAZONE HCL 45 MG PO TABS: 45.0000 mg | ORAL_TABLET | Freq: Every day | ORAL | Status: DC

## 2010-07-20 MED ORDER — ATORVASTATIN CALCIUM 20 MG PO TABS: 20.0000 mg | ORAL_TABLET | Freq: Every day | ORAL | Status: DC

## 2010-07-21 ENCOUNTER — Ambulatory Visit (HOSPITAL_BASED_OUTPATIENT_CLINIC_OR_DEPARTMENT_OTHER): Payer: Medicare Other | Attending: Pulmonary Disease

## 2010-07-21 DIAGNOSIS — G4733 Obstructive sleep apnea (adult) (pediatric): Secondary | ICD-10-CM | POA: Insufficient documentation

## 2010-07-23 ENCOUNTER — Other Ambulatory Visit: Payer: Self-pay | Admitting: Internal Medicine

## 2010-07-23 MED ORDER — PIOGLITAZONE HCL 45 MG PO TABS: 45.0000 mg | ORAL_TABLET | Freq: Every day | ORAL | Status: DC

## 2010-07-28 DIAGNOSIS — G4733 Obstructive sleep apnea (adult) (pediatric): Secondary | ICD-10-CM

## 2010-07-29 NOTE — Procedures (Signed)
NAME:  Stephen Herrera, Stephen Herrera                ACCOUNT NO.:  [id number]  MEDICAL RECORD NO.:  [id number]          PATIENT TYPE:  OUT  LOCATION:  SLEEP CENTER                 FACILITY:  [facility]  PHYSICIAN:  Barbaraann Share, MD,FCCPDATE OF BIRTH:  1955/09/02  DATE OF STUDY:  07/21/2010                           NOCTURNAL POLYSOMNOGRAM  REFERRING PHYSICIAN:  Adreona Brand M Aristea Posada  INDICATION FOR STUDY:  Hypersomnia with sleep apnea.  EPWORTH SLEEPINESS SCORE:  9.  MEDICATIONS:  SLEEP ARCHITECTURE:  The patient had a total sleep time of 323 minutes with no slow wave sleep and only 52 minutes of REM.  Sleep onset latency was normal at 24 minutes, and REM onset was normal at 62 minutes.  Sleep efficiency was moderately reduced at 83%.  RESPIRATORY DATA:  The patient was found to have 7 apneas and 17 obstructive hypopneas, giving him an apnea-hypopnea index of only 5 events per hour.  The events occurred primarily in the supine position, and there was mild to loud snoring noted throughout.  OXYGEN DATA:  The patient had O2 desaturation as low as 90% with his obstructive events.  CARDIAC DATA:  Rare PAC noted, but no clinically significant arrhythmias were seen.  MOVEMENT-PARASOMNIA:  The patient had very small numbers of leg jerks with no significant arousals or awakening.  There were no abnormal behaviors seen.  IMPRESSIONS-RECOMMENDATIONS: 1. Very mild obstructive sleep apnea/hypopnea syndrome with an AHI of     5 events per hour and O2 desaturation as low as 90%.  Treatment for     this degree of sleep apnea should focus primarily on weight loss     and possibly positional therapy, however, other modalities such as     surgery, dental appliance, and CPAP to be considered if     it is significantly impacting his quality of life.  Clinical     correlation is suggested. 2. Rare PAC noted, but no clinically significant arrhythmias were     seen.     Barbaraann Share, MD,FCCP Diplomate, American  Board of Sleep Medicine Electronically Signed    KMC/MEDQ  D:  07/28/2010 21:06:10  T:  07/29/2010 11:33:29  Job:  [id number]

## 2010-07-30 ENCOUNTER — Telehealth: Payer: Self-pay | Admitting: Pulmonary Disease

## 2010-07-30 NOTE — Telephone Encounter (Signed)
Pt returning call can be reached at [phone].Stephen Herrera

## 2010-07-30 NOTE — Telephone Encounter (Signed)
LMOMTCB.  Pt just needs an appt with KC to discuss sleep study results.  I have held some slots on Thursday 6/21 in the AM.  Otherwise, ok to book pt the week of 6/18 to 6/22 in the 4:30 slot.

## 2010-07-30 NOTE — Telephone Encounter (Signed)
Pt scheduled to see KC on Monday 6/18 at 4:30 pm

## 2010-08-09 ENCOUNTER — Encounter: Payer: Self-pay | Admitting: Pulmonary Disease

## 2010-08-09 ENCOUNTER — Ambulatory Visit (INDEPENDENT_AMBULATORY_CARE_PROVIDER_SITE_OTHER): Payer: Medicare Other | Admitting: Pulmonary Disease

## 2010-08-09 VITALS — BP 114/62 | HR 94 | Temp 98.0°F | Ht 67.0 in | Wt 222.6 lb

## 2010-08-09 DIAGNOSIS — G4733 Obstructive sleep apnea (adult) (pediatric): Secondary | ICD-10-CM

## 2010-08-09 NOTE — Progress Notes (Signed)
  Subjective:    Patient ID: Stephen Herrera, male    DOB: 1955/07/18, 55 y.o.   MRN: [id number]  HPI The pt comes in today for f/u of his recent sleep study.  He was found to have very mild osa, with AHI 5/hr and mild desaturation.  I have reviewed the study with him in detail, and answered all of his questions.    Review of Systems  Constitutional: Negative for fever and unexpected weight change.  HENT: Negative for ear pain, nosebleeds, congestion, sore throat, rhinorrhea, sneezing, trouble swallowing, dental problem, postnasal drip and sinus pressure.   Eyes: Negative for redness and itching.  Respiratory: Negative for cough, chest tightness, shortness of breath and wheezing.   Cardiovascular: Negative for palpitations and leg swelling.  Gastrointestinal: Negative for nausea and vomiting.  Genitourinary: Negative for dysuria.  Musculoskeletal: Negative for joint swelling.  Skin: Negative for rash.  Neurological: Negative for headaches.  Hematological: Does not bruise/bleed easily.  Psychiatric/Behavioral: Negative for dysphoric mood. The patient is not nervous/anxious.        Objective:   Physical Exam Obese male in nad Deviated septum LE without edema, no cyanosis  Alert, oriented, moves all 4        Assessment & Plan:

## 2010-08-09 NOTE — Assessment & Plan Note (Signed)
The pt has very mild osa by his recent sleep study, but is symptomatic at night and during the day.  I have discussed with him conservative treatments (weight loss and positional therapy) as well as more aggressive treatments (surgery, dental appliance, cpap).  He has nasal obstruction with daytime congestion, and would like to consider surgical options.  Will refer to ENT for evaluation.

## 2010-08-09 NOTE — Patient Instructions (Signed)
Will refer to ENT for surgical evaluation Work on weight loss

## 2010-08-12 ENCOUNTER — Encounter: Payer: Self-pay | Admitting: Pulmonary Disease

## 2010-08-27 ENCOUNTER — Encounter: Payer: Self-pay | Admitting: Pulmonary Disease

## 2010-10-18 ENCOUNTER — Other Ambulatory Visit: Payer: Self-pay | Admitting: Internal Medicine

## 2010-10-18 MED ORDER — ATORVASTATIN CALCIUM 20 MG PO TABS: 20.0000 mg | ORAL_TABLET | Freq: Every day | ORAL | Status: DC

## 2010-12-02 LAB — CBC
HCT: 45
Hemoglobin: 15.3
MCHC: 34.1
MCV: 91.5
Platelets: 284
RBC: 4.91
RDW: 13.2
WBC: 8.1

## 2010-12-02 LAB — COMPREHENSIVE METABOLIC PANEL
ALT: 32
AST: 24
CO2: 27
Chloride: 109
Creatinine, Ser: 1.02
GFR calc Af Amer: >60
GFR calc non Af Amer: >60
Glucose, Bld: 131 — ABNORMAL HIGH
Total Bilirubin: 0.6

## 2010-12-02 LAB — URINALYSIS, ROUTINE W REFLEX MICROSCOPIC
Glucose, UA: NEGATIVE
Hgb urine dipstick: NEGATIVE
Ketones, ur: NEGATIVE
Protein, ur: NEGATIVE

## 2010-12-02 LAB — DIFFERENTIAL
Basophils Absolute: 0
Eosinophils Absolute: 0.1
Eosinophils Relative: 2
Lymphocytes Relative: 27
Neutrophils Relative %: 65

## 2010-12-02 LAB — PROTIME-INR: Prothrombin Time: 12.9

## 2010-12-03 ENCOUNTER — Encounter: Payer: Self-pay | Admitting: Internal Medicine

## 2010-12-03 ENCOUNTER — Ambulatory Visit (INDEPENDENT_AMBULATORY_CARE_PROVIDER_SITE_OTHER): Payer: Medicare Other | Admitting: Internal Medicine

## 2010-12-03 VITALS — BP 112/70 | HR 88 | Temp 98.4°F | Ht 67.0 in | Wt 224.0 lb

## 2010-12-03 DIAGNOSIS — E785 Hyperlipidemia, unspecified: Secondary | ICD-10-CM

## 2010-12-03 DIAGNOSIS — E119 Type 2 diabetes mellitus without complications: Secondary | ICD-10-CM

## 2010-12-03 DIAGNOSIS — E039 Hypothyroidism, unspecified: Secondary | ICD-10-CM

## 2010-12-03 DIAGNOSIS — Z0000 Encounter for general adult medical examination without abnormal findings: Secondary | ICD-10-CM

## 2010-12-03 MED ORDER — OXYCODONE-ACETAMINOPHEN 10-325 MG PO TABS: 1.0000 | ORAL_TABLET | ORAL | Status: DC | PRN

## 2010-12-03 NOTE — Patient Instructions (Signed)
Continue all other medications as before Please return in 6 mo with Lab testing done 3-5 days before  

## 2010-12-03 NOTE — Assessment & Plan Note (Signed)
stable overall by hx and exam, most recent data reviewed with pt, and pt to continue medical treatment as before  Lab Results  Component Value Date   TSH 2.53 06/03/2010
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
## 2010-12-03 NOTE — Assessment & Plan Note (Signed)
stable overall by hx and exam, most recent data reviewed with pt, and pt to continue medical treatment as before, to cont to f.u with endo, aug 2012 results reviewed with pt

## 2010-12-03 NOTE — Assessment & Plan Note (Signed)
stable overall by hx and exam, most recent data reviewed with pt, and pt to continue medical treatment as before  Lab Results  Component Value Date   HGBA1C 7.7* 07/16/2010

## 2010-12-22 ENCOUNTER — Other Ambulatory Visit: Payer: Self-pay

## 2010-12-22 NOTE — Telephone Encounter (Signed)
Patient called to request to request to reorder his Lipitor through [facility]. I called [phone] and ordered his Lipitor. Patient number is [id number] and order #[id number], ordered number #[id number] and will take 7-10 working days to come into office. Called the patient informed have ordered medication requested.

## 2011-01-26 ENCOUNTER — Encounter: Payer: Self-pay | Admitting: Internal Medicine

## 2011-01-26 ENCOUNTER — Ambulatory Visit (INDEPENDENT_AMBULATORY_CARE_PROVIDER_SITE_OTHER): Payer: Medicare Other | Admitting: Internal Medicine

## 2011-01-26 DIAGNOSIS — R197 Diarrhea, unspecified: Secondary | ICD-10-CM

## 2011-01-26 DIAGNOSIS — F411 Generalized anxiety disorder: Secondary | ICD-10-CM

## 2011-01-26 DIAGNOSIS — F419 Anxiety disorder, unspecified: Secondary | ICD-10-CM | POA: Insufficient documentation

## 2011-01-26 DIAGNOSIS — R079 Chest pain, unspecified: Secondary | ICD-10-CM | POA: Insufficient documentation

## 2011-01-26 MED ORDER — ESOMEPRAZOLE MAGNESIUM 40 MG PO CPDR: 40.0000 mg | DELAYED_RELEASE_CAPSULE | Freq: Every day | ORAL | Status: DC

## 2011-01-26 MED ORDER — METFORMIN HCL 500 MG PO TABS: 1000.0000 mg | ORAL_TABLET | Freq: Two times a day (BID) | ORAL | Status: DC
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
## 2011-01-26 NOTE — Assessment & Plan Note (Addendum)
I suspect related to his IBS, but with hx of sweats/flushing will check for carcinoid ,  to f/u any worsening symptoms or concerns

## 2011-01-26 NOTE — Assessment & Plan Note (Addendum)
I agree with pt most likely GI related such as reflux, to re-start the nexium asd,refer GI per pt request, declines cxr, stress test or card referral  Noted:  Total time for pt history, examination, review of labs presented per pt per endo with pt in room, determination of diagnosis and plan for further eval and tx is > 40 min, total with documentation > 50 min

## 2011-01-26 NOTE — Assessment & Plan Note (Signed)
Rather severe, tense today, frustrated over symptoms, declines need for cousneling or other medication at this time

## 2011-01-26 NOTE — Patient Instructions (Signed)
Take all new medications as prescribed - the nexium Please go to LAB in the Basement for the urine tests to be done today You will be contacted regarding the referral for: GI Continue all other medications as before Please keep your appointments with your specialists as you have planned - Dr Sharl Ma Please call if you feel you need further evaluation per Urology for the bladder issue

## 2011-01-28 ENCOUNTER — Other Ambulatory Visit: Payer: Medicare Other

## 2011-01-28 DIAGNOSIS — R197 Diarrhea, unspecified: Secondary | ICD-10-CM

## 2011-01-30 ENCOUNTER — Encounter: Payer: Self-pay | Admitting: Internal Medicine

## 2011-02-08 ENCOUNTER — Encounter: Payer: Self-pay | Admitting: Internal Medicine

## 2011-02-17 ENCOUNTER — Telehealth: Payer: Self-pay | Admitting: Internal Medicine

## 2011-02-17 ENCOUNTER — Ambulatory Visit (INDEPENDENT_AMBULATORY_CARE_PROVIDER_SITE_OTHER): Payer: Medicare Other | Admitting: Internal Medicine

## 2011-02-17 ENCOUNTER — Encounter: Payer: Self-pay | Admitting: Internal Medicine

## 2011-02-17 ENCOUNTER — Other Ambulatory Visit: Payer: Medicare Other

## 2011-02-17 VITALS — BP 110/60 | HR 80 | Ht 67.0 in | Wt 231.0 lb

## 2011-02-17 DIAGNOSIS — R197 Diarrhea, unspecified: Secondary | ICD-10-CM

## 2011-02-17 DIAGNOSIS — K219 Gastro-esophageal reflux disease without esophagitis: Secondary | ICD-10-CM

## 2011-02-17 DIAGNOSIS — R1013 Epigastric pain: Secondary | ICD-10-CM

## 2011-02-17 MED ORDER — PEG-KCL-NACL-NASULF-NA ASC-C 100 G PO SOLR: 1.0000 | Freq: Once | ORAL | Status: DC

## 2011-02-17 NOTE — Progress Notes (Addendum)
Subjective:    Patient ID: Stephen Herrera, male    DOB: [birth year], 55 y.o.   MRN: [id number]  HPI Stephen Herrera is a 55 yo male with PMH of CAD, diabetes, OSA, hypothyroidism, low testosterone, hyperlipidemia who is seen in consultation at the request of Dr. Jonny Ruiz for evaluation of epigastric burning as well as diarrhea.  The patient reports developing diarrhea about 2 and half months ago. This is a new problem for him. He reports loose and watery stools, reporting occasionally he sees some dark almost black stool. He denies red blood in his stool. He is having 3-6 bowel movements per day. He denies nocturnal stooling. He reports feeling an "irritation" or discomfort in his epigastrium as well as lower abdomen. This is not frankly of pain but bothers him. He denies recent antibiotic use. He is reporting some nausea without vomiting. He reports overall a decreased appetite but an increasing weight. He also reports a tightness and burning in his chest which he associates with acid reflux. He reports this does not feel like cardiac pain to him. He was restarted on Nexium, which she is still taking. He reports this has helped some but not fully. He is still having the chest tightness as well as acid reflux symptoms. He did use Nexium several years ago with excellent relief of acid/heartburn, but this medicine was discontinued and this issue seemed to self resolve at that time. Again, regarding his diarrhea he feels that this started around the time that he started liraglutide for his diabetes.  He reports excellent control of his diabetes in the past, but over the last 3 months or so he's had erratic and higher sugars.  He denies fevers and chills. He had previously reported night sweats to his primary care provider, but this is not a major issue now.  He reports previously having colonoscopy in [place] and [place]. He feels his last colonoscopy was in 2010 and he reports a history of  adenomatous colon polyps. He feels that he is due a repeat in 2013  Review of Systems Constitutional: See history of present illness HEENT: Negative for sore throat, mouth sores and trouble swallowing. Eyes: Positive for vision changes Respiratory: Negative for cough, chest tightness and shortness of breath Cardiovascular: Negative for chest pain, palpitations and positive for lower extremity swelling Gastrointestinal: See history of present illness Genitourinary: Negative for dysuria and hematuria. Musculoskeletal: Positive for back pain, negative arthralgias and myalgias Skin: Negative for rash or color change Neurological: Negative for headaches, weakness, numbness Hematological: Negative for adenopathy, negative for easy bruising/bleeding Psychiatric/behavioral: Negative for depressed mood, negative for anxiety   Patient Active Problem List  Diagnoses  . COPD (chronic obstructive pulmonary disease)  . IBS (irritable bowel syndrome)  . Chronic fatigue syndrome  . Chronic pain  . ADD (attention deficit disorder)  . Gout  . Hyperlipidemia  . CAD (coronary atherosclerotic disease)  . Hypogonadism male  . Hypothyroidism  . Chest pain syndrome  . Allergic rhinitis, seasonal  . History of colonoscopy with polypectomy  . DM (diabetes mellitus)  . Preventative health care  . Urinary retention with incomplete bladder emptying  . OSA (obstructive sleep apnea)  . Anxiety  . Chest pain  . Diarrhea   Past Surgical History  Procedure Date  . Anal fissure repair   . Carpal tunnel release     Right  . External hemorroid   . Lumbar disc surgery 2008  . Cervical disc surgery 1995 and  2011   Current Outpatient Prescriptions  Medication Sig Dispense Refill  . aspirin 81 MG EC tablet Take 81 mg by mouth daily.        [name] atorvastatin (LIPITOR) 20 MG tablet Take 1 tablet (20 mg total) by mouth daily.  90 tablet  3  . esomeprazole (NEXIUM) 40 MG capsule Take 1 capsule (40 mg total) by  mouth daily.  90 capsule  3  . Liraglutide (VICTOZA) 18 MG/3ML SOLN Inject 0.6 mLs into the skin daily.        . metFORMIN (GLUCOPHAGE) 500 MG tablet Take 2 tablets (1,000 mg total) by mouth 2 (two) times daily with a meal.  120 tablet  11  . oxyCODONE-acetaminophen (PERCOCET) 10-325 MG per tablet Take 1 tablet by mouth as needed.  30 tablet  0  . pioglitazone (ACTOS) 45 MG tablet Take 45 mg by mouth daily.        [name] testosterone cypionate (DEPOTESTOTERONE CYPIONATE) 100 MG/ML injection Inject 200 mg into the muscle every 14 (fourteen) days. For IM use only       . peg 3350 powder (MOVIPREP) 100 G SOLR Take 1 kit (100 g total) by mouth once.  1 kit  0   No Known Allergies  Family History  Problem Relation Age of Onset  . Other Mother     brain tumor  . Hyperlipidemia Father   . Diabetes Father   . Prostate cancer Maternal Uncle   . Colon polyps Father   . Colon polyps Brother     x 2   Social History  . Marital Status: Divorced    Number of Children: 3   Occupational History  . disabled     neck injury  . retired     Psychiatrist   Social History Main Topics  . Smoking status: Former Smoker -- 2.0 packs/day for 25 years    Quit date: 02/22/2004  . Smokeless tobacco: Never Used  . Alcohol Use: Yes     socially  . Drug Use: No   Social History Narrative   Lives with Johnn Hai      Objective:   Physical Exam BP 110/60  Pulse 80  Ht 5\' 7"  (1.702 m)  Wt 231 lb (104.781 kg)  BMI 36.18 kg/m2 Constitutional: Well-developed and well-nourished. No distress. HEENT: Normocephalic and atraumatic. Oropharynx is clear and moist. No oropharyngeal exudate. Conjunctivae are normal. Pupils are equal round and reactive to light. No scleral icterus. Neck: Neck supple. Trachea midline. Cardiovascular: Normal rate, regular rhythm and intact distal pulses. No M/R/G Pulmonary/chest: Effort normal and breath sounds normal. No wheezing, rales or rhonchi. Abdominal: Soft, mild  tenderness in the left lower quadrant without rebound or guarding, obese, nondistended. Bowel sounds active throughout. There are no masses palpable. No hepatosplenomegaly. Extremities: no clubbing, cyanosis, or edema Lymphadenopathy: No cervical adenopathy noted. Neurological: Alert and oriented to person place and time. Skin: Skin is warm and dry. No rashes noted. Psychiatric: Normal mood and affect. Behavior is normal.  Urine 5-HIAA - within normal limits    Assessment & Plan:   55 yo male with PMH of CAD, diabetes, OSA, hypothyroidism, low testosterone, hyperlipidemia who is seen in consultation at the request of Dr. Jonny Ruiz for evaluation of epigastric burning as well as diarrhea.  1. Diarrhea -- the patient's diarrhea is approaching 3 months duration. I would like to send stool studies for completeness and to rule out possible infection. Stool be sent for culture, C. difficile, O.  and P., and fecal leukocytes. If negative, then we will consider loperamide in an attempt to control his diarrhea. We'll also proceed with repeat colonoscopy for further evaluation, assuming his stool studies are negative. If endoscopically normal, we'll plan for random biopsies to rule out microscopic colitis.  It is possible that his change in bowel habits are related to the initiation of liraglutide (diarrhea is a known side effect).  If nothing is found on testing endoscopy, then I will ask his endocrinologist if this medicine could be changed.  2. Dyspepsia/GERD/epigastric pain -- given his incomplete response to PPI, we will proceed with upper endoscopy. This will be done on the same day as his colonoscopy. He will continue Nexium 40 mg daily. Epigastric pain and nausea are also listed side effects of liraglutide, and again this could be contributing.  Return to be determined after procedures   Addendum: --Labs obtained from [facility] endocrinology and have been reviewed. Jan 20, 2011 --SPEP normal, hepatitis B  surface antigen and antibody, core antibody negative, celiac panel normal, A1c 8.4% Hepatic panel: Total protein 7.1, albumin 4.2, total bili 0.5, alkaline phosphatase 68, AST 49, ALT 81 TSH 3.43 Hepatitis C antibody negative 30 BC 6.3, hemoglobin 14.0, hematocrit 41.6, MCV 91.3, platelet 209   We briefly discussed mild elevation in AST/ALT. This may in fact be related to nonalcoholic fatty liver disease. Apparently this is a persistent elevation, and we will address this at followup  Addendum: Colonoscopy record received from [facility] endoscopy department Date of procedure 12/25/2007 Excellent prep exam to the cecum 4 polyps removed from the right and left colon. Mild sigmoid diverticulosis Pathology: Fragment of tubular adenoma x1, fragments of hyperplastic polyp x4

## 2011-02-17 NOTE — Patient Instructions (Signed)
Go directly to the basement to do your stool studies.  You have been scheduled for a Upper Endoscopy/ Colonoscopy with propofol. See separate instructions. Pick up your prep kit from your pharmacy.  cc: Oliver Barre, MD

## 2011-02-17 NOTE — Telephone Encounter (Signed)
Received copies from Dr. Vincent Peyer at [facility],on 02/17/11. Forwarded 3pages to Dr. Margaretmary Lombard review.

## 2011-02-18 ENCOUNTER — Other Ambulatory Visit: Payer: Medicare Other

## 2011-02-18 DIAGNOSIS — R197 Diarrhea, unspecified: Secondary | ICD-10-CM

## 2011-02-19 LAB — FECAL LACTOFERRIN, QUANT: Lactoferrin: NEGATIVE

## 2011-02-21 LAB — CLOSTRIDIUM DIFFICILE BY PCR: Toxigenic C. Difficile by PCR: NOT DETECTED

## 2011-02-22 HISTORY — PX: COLONOSCOPY: SHX174

## 2011-02-23 ENCOUNTER — Telehealth: Payer: Self-pay | Admitting: *Deleted

## 2011-02-23 NOTE — Telephone Encounter (Signed)
Message copied by Daphine Deutscher on Wed Feb 23, 2011  4:32 PM ------      Message from: Beverley Fiedler      Created: Wed Feb 23, 2011  1:33 PM       Neg stool studies

## 2011-02-23 NOTE — Telephone Encounter (Signed)
Left a message with family for patient to call me. 

## 2011-02-24 NOTE — Telephone Encounter (Signed)
Spoke with patient and gave him the results. 

## 2011-03-02 ENCOUNTER — Other Ambulatory Visit: Payer: Self-pay | Admitting: Internal Medicine

## 2011-03-02 MED ORDER — ATORVASTATIN CALCIUM 20 MG PO TABS: 20.0000 mg | ORAL_TABLET | Freq: Every day | ORAL | Status: DC

## 2011-03-03 ENCOUNTER — Other Ambulatory Visit: Payer: Self-pay

## 2011-03-03 ENCOUNTER — Encounter: Payer: Self-pay | Admitting: Internal Medicine

## 2011-03-03 ENCOUNTER — Ambulatory Visit (AMBULATORY_SURGERY_CENTER): Payer: Medicare Other | Admitting: Internal Medicine

## 2011-03-03 VITALS — BP 123/82 | HR 79 | Temp 95.5°F | Resp 12 | Ht 67.0 in | Wt 231.0 lb

## 2011-03-03 DIAGNOSIS — R197 Diarrhea, unspecified: Secondary | ICD-10-CM

## 2011-03-03 DIAGNOSIS — D126 Benign neoplasm of colon, unspecified: Secondary | ICD-10-CM

## 2011-03-03 DIAGNOSIS — Z1211 Encounter for screening for malignant neoplasm of colon: Secondary | ICD-10-CM

## 2011-03-03 DIAGNOSIS — R1013 Epigastric pain: Secondary | ICD-10-CM

## 2011-03-03 DIAGNOSIS — K2900 Acute gastritis without bleeding: Secondary | ICD-10-CM

## 2011-03-03 DIAGNOSIS — K2980 Duodenitis without bleeding: Secondary | ICD-10-CM

## 2011-03-03 LAB — GLUCOSE, CAPILLARY
Glucose-Capillary: 173 mg/dL — ABNORMAL HIGH (ref 70–99)
Glucose-Capillary: 158 mg/dL — ABNORMAL HIGH (ref 70–99)

## 2011-03-03 MED ORDER — SODIUM CHLORIDE 0.9 % IV SOLN: 500.0000 mL | INTRAVENOUS | Status: DC

## 2011-03-03 NOTE — Progress Notes (Signed)
Patient did not experience any of the following events: a burn prior to discharge; a fall within the facility; wrong site/side/patient/procedure/implant event; or a hospital transfer or hospital admission upon discharge from the facility. (G8907) Patient did not have preoperative order for IV antibiotic SSI prophylaxis. (G8918)  

## 2011-03-03 NOTE — Patient Instructions (Signed)
Handouts given on high fiber diet, hemorrhoids, diverticulosis, gastritis  We will be mailing you a letter in 1-2 weeks with the pathology results from both procedures and dr pyrtles recommendations and when he will have you do your next procedure.  Discharge instructions per blue and green sheets  Avoid all NSAIDS like motrin,aleve,advil,aspirin or any products containing these until further notice. Use tylenol only for pain  Continue youe reflux medicine daily as directed. Please take this medicine 20-30 minutes before your first meal of the day

## 2011-03-03 NOTE — Op Note (Signed)
[facility] [address] [place], [place]  [address]  COLONOSCOPY PROCEDURE REPORT  PATIENT:  Stephen, Herrera  MR#:  [id number] BIRTHDATE:  Apr 05, 1955, 56 yrs. old  GENDER:  male ENDOSCOPIST:  Carie Caddy. Pyrtle, MD REF. BY:  Oliver Barre, M.D. PROCEDURE DATE:  03/03/2011 PROCEDURE:  Colon with cold biopsy polypectomy, Colonoscopy with biopsy ASA CLASS:  Class II INDICATIONS:  unexplained diarrhea, surveillance and high-risk screening MEDICATIONS:   propofol (Diprivan) 350 mg IV, MAC sedation, administered by CRNA  DESCRIPTION OF PROCEDURE:   After the risks benefits and alternatives of the procedure were thoroughly explained, informed consent was obtained.  Digital rectal exam was performed and revealed no rectal masses.   The LB CF-[id number] endoscope was introduced through the anus and advanced to the terminal ileum which was intubated for a short distance, without limitations. The quality of the prep was good, using MoviPrep.  The instrument was then slowly withdrawn as the colon was fully examined. <<PROCEDUREIMAGES>>  FINDINGS:  The terminal ileum appeared normal.  Mild diverticulosis was found ascending colon and sigmoid colon.  Two < 5 mm polyps were found in the sigmoid colon. The polyps were removed using cold biopsy forceps.  This was otherwise a normal examination of the colon. Random biopsies were obtained and sent to pathology.  Small Internal Hemorrhoids were found. Retroflexed views in the rectum revealed no other findings other than those already described.  The scope was then withdrawn from the cecum and the procedure completed.  COMPLICATIONS:  None ENDOSCOPIC IMPRESSION: 1) Normal terminal ileum 2) Mild diverticulosis ascending colon and sigmoid colon 3) Two polyps in the sigmoid colon. Removed and sent to pathology. 4) Otherwise normal examination, random biopsies performed given history of diarrhea. 5) Internal  hemorrhoids  RECOMMENDATIONS: 1) Await pathology results 2) High fiber diet. 3) If the polyps removed today are proven to be adenomatous (pre-cancerous) polyps, you will need a repeat colonoscopy in 5 years. Otherwise you should continue to follow colorectal cancer screening guidelines for "routine risk" patients with colonoscopy in 10 years. 4) You will receive a letter within 1-2 weeks with the results of your biopsy as well as final recommendations. Please call my office if you have not received a letter after 3 weeks.  Carie Caddy. Rhea Belton, MD  CC:  The Patient Corwin Levins, MD  n. Rosalie DoctorCarie Caddy. Pyrtle at 03/03/2011 10:17 AM  Vincent Peyer, [phone]

## 2011-03-03 NOTE — Op Note (Signed)
[facility] [address] [place], [place]  [address]  ENDOSCOPY PROCEDURE REPORT  PATIENT:  Ragnar, Waas  MR#:  [id number] BIRTHDATE:  January 22, 1956, 56 yrs. old  GENDER:  male ENDOSCOPIST:  Carie Caddy. Maison Kestenbaum, MD Referred by:  Oliver Barre, M.D. PROCEDURE DATE:  03/03/2011 PROCEDURE:  EGD with biopsy, [address] ASA CLASS:  Class II INDICATIONS:  epigastric pain, GERD MEDICATIONS:   MAC sedation, administered by CRNA, propofol (Diprivan) 200 mg IV TOPICAL ANESTHETIC:  none  DESCRIPTION OF PROCEDURE:   After the risks benefits and alternatives of the procedure were thoroughly explained, informed consent was obtained.  The [facility] GIF-[id number] [id number] endoscope was introduced through the mouth and advanced to the second portion of the duodenum, without limitations.  The instrument was slowly withdrawn as the mucosa was fully examined. <<PROCEDUREIMAGES>>  The esophagus and gastroesophageal junction were completely normal in appearance.  Moderate gastritis was found in the body and the antrum of the stomach. Multiple biopsies were obtained and sent to pathology.  Mild duodenitis was found in the bulb and duodenal sweep. Multiple biopsies were obtained and sent to pathology. Retroflexed views revealed no abnormalities.    The scope was then withdrawn from the patient and the procedure completed.  COMPLICATIONS:  None  ENDOSCOPIC IMPRESSION: 1) Normal esophagus 2) Moderate gastritis in the body and the antrum of the stomach. Biopsies obtained and sent to pathology. 3) Mild duodenitis.  Biopsies obtained and sent to pathology.  RECOMMENDATIONS: 1) Await pathology results 2) avoid NSAIDS 3) follow-up of helicobacter pylori status, treat if indicated 4) Continue taking your PPI (antiacid medicine) once daily. It is best to be taken 20-30 minutes prior to breakfast meal.  Carie Caddy. Rhea Belton, MD  CC:  The Patient Corwin Levins, MD  n. Rosalie DoctorCarie Caddy. Cayley Pester at 03/03/2011 10:23  AM  Vincent Peyer, [phone]

## 2011-03-04 ENCOUNTER — Telehealth: Payer: Self-pay | Admitting: *Deleted

## 2011-03-04 NOTE — Telephone Encounter (Signed)

## 2011-03-08 ENCOUNTER — Encounter: Payer: Self-pay | Admitting: Internal Medicine

## 2011-04-18 ENCOUNTER — Ambulatory Visit (INDEPENDENT_AMBULATORY_CARE_PROVIDER_SITE_OTHER): Payer: Medicare Other | Admitting: Internal Medicine

## 2011-04-18 ENCOUNTER — Encounter: Payer: Self-pay | Admitting: Internal Medicine

## 2011-04-18 VITALS — BP 118/60 | HR 101 | Temp 98.2°F | Ht 65.5 in | Wt 230.8 lb

## 2011-04-18 DIAGNOSIS — H109 Unspecified conjunctivitis: Secondary | ICD-10-CM

## 2011-04-18 DIAGNOSIS — J320 Chronic maxillary sinusitis: Secondary | ICD-10-CM

## 2011-04-18 DIAGNOSIS — E119 Type 2 diabetes mellitus without complications: Secondary | ICD-10-CM

## 2011-04-18 MED ORDER — AMOXICILLIN-POT CLAVULANATE 875-125 MG PO TABS: 1.0000 | ORAL_TABLET | Freq: Two times a day (BID) | ORAL | Status: AC

## 2011-04-18 MED ORDER — POLYMYXIN B-TRIMETHOPRIM 10000-0.1 UNIT/ML-% OP SOLN: 1.0000 [drp] | OPHTHALMIC | Status: AC
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
## 2011-04-18 NOTE — Patient Instructions (Signed)
It was good to see you today. Augmentin antibiotics twice daily x10 days and antibiotic eyedrops - Your prescription(s) have been submitted to your pharmacy. Please take as directed and contact our office if you believe you are having problem(s) with the medication(s).  Sinusitis Sinuses are air pockets within the bones of your face. The growth of bacteria within a sinus leads to infection. The infection prevents the sinuses from draining. This infection is called sinusitis. SYMPTOMS   There will be different areas of pain depending on which sinuses have become infected.  The maxillary sinuses often produce pain beneath the eyes.     Frontal sinusitis may cause pain in the middle of the forehead and above the eyes.  Other problems (symptoms) include:  Toothaches.     Colored, pus-like (purulent) drainage from the nose.     Swelling, warmth, and tenderness over the sinus areas may be signs of infection.  TREATMENT   Sinusitis is most often determined by an exam.X-rays may be taken. If x-rays have been taken, make sure you obtain your results or find out how you are to obtain them. Your caregiver may give you medications (antibiotics). These are medications that will help kill the bacteria causing the infection. You may also be given a medication (decongestant) that helps to reduce sinus swelling.   HOME CARE INSTRUCTIONS    Only take over-the-counter or prescription medicines for pain, discomfort, or fever as directed by your caregiver.     Drink extra fluids. Fluids help thin the mucus so your sinuses can drain more easily.     Applying either moist heat or ice packs to the sinus areas may help relieve discomfort.     Use saline nasal sprays to help moisten your sinuses. The sprays can be found at your local drugstore.  SEEK IMMEDIATE MEDICAL CARE IF:  You have a fever.     You have increasing pain, severe headaches, or toothache.     You have nausea, vomiting, or drowsiness.      You develop unusual swelling around the face or trouble seeing.  MAKE SURE YOU:    Understand these instructions.     Will watch your condition.     Will get help right away if you are not doing well or get worse.  Document Released: 02/07/2005 Document Revised: 10/20/2010 Document Reviewed: 09/06/2006 [facility] Patient Information 2012 [place], [place].  Conjunctivitis Conjunctivitis is commonly called "pink eye." Conjunctivitis can be caused by bacterial or viral infection, allergies, or injuries. There is usually redness of the lining of the eye, itching, discomfort, and sometimes discharge. There may be deposits of matter along the eyelids. A viral infection usually causes a watery discharge, while a bacterial infection causes a yellowish, thick discharge. Pink eye is very contagious and spreads by direct contact. You may be given antibiotic eyedrops as part of your treatment. Before using your eye medicine, remove all drainage from the eye by washing gently with warm water and cotton balls. Continue to use the medication until you have awakened 2 mornings in a row without discharge from the eye. Do not rub your eye. This increases the irritation and helps spread infection. Use separate towels from other household members. Wash your hands with soap and water before and after touching your eyes. Use cold compresses to reduce pain and sunglasses to relieve irritation from light. Do not wear contact lenses or wear eye makeup until the infection is gone. SEEK MEDICAL CARE IF:    Your symptoms  are not better after 3 days of treatment.     You have increased pain or trouble seeing.     The outer eyelids become very red or swollen.  Document Released: 03/17/2004 Document Revised: 10/20/2010 Document Reviewed: 02/07/2005 [facility] Patient Information 2012 [place], [place].

## 2011-06-03 ENCOUNTER — Ambulatory Visit (INDEPENDENT_AMBULATORY_CARE_PROVIDER_SITE_OTHER): Payer: Medicare Other | Admitting: Internal Medicine

## 2011-06-03 ENCOUNTER — Encounter: Payer: Self-pay | Admitting: Internal Medicine

## 2011-06-03 ENCOUNTER — Other Ambulatory Visit (INDEPENDENT_AMBULATORY_CARE_PROVIDER_SITE_OTHER): Payer: Medicare Other

## 2011-06-03 VITALS — BP 104/60 | HR 76 | Temp 98.4°F | Ht 67.0 in | Wt 224.2 lb

## 2011-06-03 DIAGNOSIS — R61 Generalized hyperhidrosis: Secondary | ICD-10-CM

## 2011-06-03 DIAGNOSIS — Z0000 Encounter for general adult medical examination without abnormal findings: Secondary | ICD-10-CM

## 2011-06-03 LAB — SEDIMENTATION RATE: Sed Rate: 26 mm/hr — ABNORMAL HIGH (ref 0–22)

## 2011-06-03 NOTE — Patient Instructions (Signed)
Continue all other medications as before Please keep your appointments with your specialists as you have planned - Dr Fayne Norrie Please go to LAB in the Basement for the blood and/or urine tests to be done today You will be contacted by phone if any changes need to be made immediately.  Otherwise, you will receive a letter about your results with an explanation. Please return in 1 year for your yearly visit, or sooner if needed, with Lab testing done 3-5 days before

## 2011-06-03 NOTE — Assessment & Plan Note (Addendum)
Unclear eitology - for esr, and spep, but o/w exam benign, suspect stress/anxiety related, declines ssri trial
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
## 2011-06-03 NOTE — Assessment & Plan Note (Signed)

## 2011-06-07 ENCOUNTER — Encounter: Payer: Self-pay | Admitting: Internal Medicine

## 2011-06-07 LAB — PROTEIN ELECTROPHORESIS, SERUM
Alpha-1-Globulin: 4.3 % (ref 2.9–4.9)
Beta 2: 5.9 % (ref 3.2–6.5)
Gamma Globulin: 17.4 % (ref 11.1–18.8)

## 2011-07-26 ENCOUNTER — Telehealth: Payer: Self-pay

## 2011-07-26 NOTE — Telephone Encounter (Signed)
Pt called requesting reorder of Lipitor through patient assistance program. Pt did not leave the name of program or number only his patient ID - [id number]

## 2011-07-26 NOTE — Telephone Encounter (Signed)
To carolyn for help

## 2011-10-04 ENCOUNTER — Ambulatory Visit (INDEPENDENT_AMBULATORY_CARE_PROVIDER_SITE_OTHER): Payer: Medicare Other | Admitting: Internal Medicine

## 2011-10-04 ENCOUNTER — Encounter: Payer: Self-pay | Admitting: Internal Medicine

## 2011-10-04 VITALS — BP 118/70 | HR 84 | Temp 98.9°F | Wt 224.4 lb

## 2011-10-04 DIAGNOSIS — R21 Rash and other nonspecific skin eruption: Secondary | ICD-10-CM | POA: Insufficient documentation

## 2011-10-04 DIAGNOSIS — E119 Type 2 diabetes mellitus without complications: Secondary | ICD-10-CM

## 2011-10-04 DIAGNOSIS — F411 Generalized anxiety disorder: Secondary | ICD-10-CM

## 2011-10-04 DIAGNOSIS — F988 Other specified behavioral and emotional disorders with onset usually occurring in childhood and adolescence: Secondary | ICD-10-CM

## 2011-10-04 DIAGNOSIS — F419 Anxiety disorder, unspecified: Secondary | ICD-10-CM

## 2011-10-04 MED ORDER — METHYLPREDNISOLONE ACETATE 80 MG/ML IJ SUSP
120.0000 mg | Freq: Once | INTRAMUSCULAR | Status: AC
  Administered 2011-10-04: 120 mg via INTRAMUSCULAR

## 2011-10-04 MED ORDER — PREDNISONE 10 MG PO TABS: ORAL_TABLET | ORAL | Status: DC

## 2011-10-04 MED ORDER — HYDROXYZINE HCL 25 MG PO TABS: ORAL_TABLET | ORAL | Status: DC
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
## 2011-10-04 NOTE — Assessment & Plan Note (Signed)
stable overall by hx and exam, most recent data reviewed with pt, and pt to continue medical treatment as before Lab Results  Component Value Date   WBC 7.0 06/03/2010   HGB 14.2 06/03/2010   HCT 40.3 06/03/2010   PLT 242.0 06/03/2010   GLUCOSE 147* 07/16/2010   CHOL 206* 07/16/2010   TRIG 278.0* 07/16/2010   HDL 45.70 07/16/2010   LDLDIRECT 129.1 07/16/2010   ALT 101* 07/16/2010   AST 77* 07/16/2010   NA 136 07/16/2010   K 4.2 07/16/2010   CL 103 07/16/2010   CREATININE 0.9 07/16/2010   BUN 17 07/16/2010   CO2 25 07/16/2010   TSH 2.53 06/03/2010   PSA 0.33 06/03/2010   INR 1.0 11/15/2006   HGBA1C 7.7* 07/16/2010   MICROALBUR 3.0* 06/03/2010

## 2011-10-04 NOTE — Assessment & Plan Note (Signed)
prob allergic in nature without obvious cause,  Suspect environmental fall related sensitivity, for depomedrol IM, predpack  Asd, atarax prn, and consider allergy referral if not responding or recurs

## 2011-10-04 NOTE — Patient Instructions (Addendum)
You had the steroid shot today Take all new medications as prescribed Continue all other medications as before Please call if you would like to be referred to allergy if the rash comes back Please return in April 2104 with Lab testing done 3-5 days before

## 2011-10-04 NOTE — Assessment & Plan Note (Signed)
stable overall by hx and exam, most recent data reviewed with pt, and pt to continue medical treatment as before Lab Results  Component Value Date   HGBA1C 7.7* 07/16/2010   Pt warned of increase in sugar with predpack use, to call for onset polys or cbg > 200

## 2011-10-04 NOTE — Assessment & Plan Note (Signed)
stable overall by hx and exam, and pt to continue medical treatment as before 

## 2011-10-25 ENCOUNTER — Telehealth: Payer: Self-pay | Admitting: Internal Medicine

## 2011-10-25 NOTE — Telephone Encounter (Signed)
Caller: [name]/Patient; Patient Name: Stephen Herrera; PCP: Oliver Barre (Adults only); Best Callback Phone Number: [phone]; Reason for call: reports he has Vertigo.  Onset "just under a week".  Looking up or turning head quickly, coughing or yawning precipitates symptoms.  Emergent sx ruled out.  Home care for the interim and parameters for callback given. See provider in 24 hours per Dizziness or Vertigo protocol.  Patient agreed.

## 2011-10-26 ENCOUNTER — Ambulatory Visit (INDEPENDENT_AMBULATORY_CARE_PROVIDER_SITE_OTHER): Payer: Medicare Other | Admitting: Internal Medicine

## 2011-10-26 ENCOUNTER — Encounter: Payer: Self-pay | Admitting: Internal Medicine

## 2011-10-26 VITALS — BP 104/72 | HR 79 | Temp 99.0°F | Ht 67.0 in | Wt 218.5 lb

## 2011-10-26 DIAGNOSIS — H8110 Benign paroxysmal vertigo, unspecified ear: Secondary | ICD-10-CM

## 2011-10-26 DIAGNOSIS — F419 Anxiety disorder, unspecified: Secondary | ICD-10-CM

## 2011-10-26 DIAGNOSIS — F411 Generalized anxiety disorder: Secondary | ICD-10-CM

## 2011-10-26 DIAGNOSIS — R42 Dizziness and giddiness: Secondary | ICD-10-CM

## 2011-10-26 DIAGNOSIS — E119 Type 2 diabetes mellitus without complications: Secondary | ICD-10-CM

## 2011-10-26 MED ORDER — MECLIZINE HCL 12.5 MG PO TABS: 12.5000 mg | ORAL_TABLET | Freq: Three times a day (TID) | ORAL | Status: AC | PRN

## 2011-10-26 NOTE — Assessment & Plan Note (Signed)
stable overall by hx and exam, most recent data reviewed with pt, and pt to continue medical treatment as before Lab Results  Component Value Date   WBC 7.0 06/03/2010   HGB 14.2 06/03/2010   HCT 40.3 06/03/2010   PLT 242.0 06/03/2010   GLUCOSE 147* 07/16/2010   CHOL 206* 07/16/2010   TRIG 278.0* 07/16/2010   HDL 45.70 07/16/2010   LDLDIRECT 129.1 07/16/2010   ALT 101* 07/16/2010   AST 77* 07/16/2010   NA 136 07/16/2010   K 4.2 07/16/2010   CL 103 07/16/2010   CREATININE 0.9 07/16/2010   BUN 17 07/16/2010   CO2 25 07/16/2010   TSH 2.53 06/03/2010   PSA 0.33 06/03/2010   INR 1.0 11/15/2006   HGBA1C 7.7* 07/16/2010   MICROALBUR 3.0* 06/03/2010    

## 2011-10-26 NOTE — Assessment & Plan Note (Addendum)
ECG reviewed as per emr, c/w BPV, for meclizine prn, and gave information on Eply maneuver to perform at home

## 2011-10-26 NOTE — Patient Instructions (Addendum)
Take all new medications as prescribed Continue all other medications as before Please try the Eply Maneuvers at home with the information provided There are also You Tube videos to demonstrate the excercises as well
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
## 2011-10-26 NOTE — Assessment & Plan Note (Signed)
Lab Results  Component Value Date   HGBA1C 7.7* 07/16/2010   stable overall by hx and exam, most recent data reviewed with pt, and pt to continue medical treatment as before

## 2011-11-07 ENCOUNTER — Telehealth: Payer: Self-pay

## 2011-11-07 NOTE — Telephone Encounter (Signed)
Called [employer] to order the patients lipitor.  Order number is [id number].  Order will take 7 to 10 business days to arrive at our office. Ordered Lipitor #90.  Called the patient to inform order has been completed.

## 2011-11-10 ENCOUNTER — Telehealth: Payer: Self-pay | Admitting: Internal Medicine

## 2011-11-10 NOTE — Telephone Encounter (Signed)
Received Lipitor from patient assistance, lm on vm informing patient the medication is ready to be picked up, left upfront

## 2012-01-16 ENCOUNTER — Ambulatory Visit (INDEPENDENT_AMBULATORY_CARE_PROVIDER_SITE_OTHER): Payer: Medicare Other | Admitting: Internal Medicine

## 2012-01-16 ENCOUNTER — Encounter: Payer: Self-pay | Admitting: Internal Medicine

## 2012-01-16 ENCOUNTER — Other Ambulatory Visit (INDEPENDENT_AMBULATORY_CARE_PROVIDER_SITE_OTHER): Payer: Medicare Other

## 2012-01-16 VITALS — BP 104/62 | HR 87 | Temp 97.6°F | Ht 67.0 in | Wt 228.5 lb

## 2012-01-16 DIAGNOSIS — E119 Type 2 diabetes mellitus without complications: Secondary | ICD-10-CM

## 2012-01-16 DIAGNOSIS — Z0000 Encounter for general adult medical examination without abnormal findings: Secondary | ICD-10-CM

## 2012-01-16 DIAGNOSIS — Z79899 Other long term (current) drug therapy: Secondary | ICD-10-CM

## 2012-01-16 DIAGNOSIS — Z125 Encounter for screening for malignant neoplasm of prostate: Secondary | ICD-10-CM

## 2012-01-16 LAB — LIPID PANEL
Cholesterol: 165 mg/dL (ref 0–200)
Total CHOL/HDL Ratio: 4

## 2012-01-16 MED ORDER — ATORVASTATIN CALCIUM 20 MG PO TABS: 20.0000 mg | ORAL_TABLET | Freq: Every day | ORAL | Status: DC
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
## 2012-01-16 NOTE — Assessment & Plan Note (Signed)
Overall doing well, age appropriate education and counseling updated, referrals for preventative services and immunizations addressed, dietary and smoking counseling addressed, most recent labs and ECG reviewed.  I have personally reviewed and have noted: 1) the patient's medical and social history 2) The pt's use of alcohol, tobacco, and illicit drugs 3) The patient's current medications and supplements 4) Functional ability including ADL's, fall risk, home safety risk, hearing and visual impairment 5) Diet and physical activities 6) Evidence for depression or mood disorder 7) The patient's height, weight, and BMI have been recorded in the chart I have made referrals, and provided counseling and education based on review of the above, for labs today

## 2012-01-16 NOTE — Patient Instructions (Addendum)
Please see Stephen Herrera at the scheduling desk about the Patient Assistance program continuation for the Lipitor Please go to LAB in the Basement for the blood and/or urine tests to be done today - just the PSA and Lipids You will be contacted by phone if any changes need to be made immediately.  Otherwise, you will receive a letter about your results with an explanation, but please check with MyChart first. Thank you for enrolling in MyChart. Please follow the instructions below to securely access your online medical record. MyChart allows you to send messages to your doctor, view your test results, renew your prescriptions, schedule appointments, and more. To Log into MyChart, please go to [email or web address], and your Username is: [email or web address] Please keep your appointments with your specialists as you have planned - endocrinology Please return in 1 year for your yearly visit, or sooner if needed, with Lab testing done 3-5 days before

## 2012-04-07 ENCOUNTER — Other Ambulatory Visit: Payer: Self-pay

## 2012-04-09 ENCOUNTER — Other Ambulatory Visit: Payer: Self-pay | Admitting: Internal Medicine

## 2012-04-16 ENCOUNTER — Encounter: Payer: Self-pay | Admitting: Internal Medicine

## 2012-04-17 ENCOUNTER — Encounter: Payer: Self-pay | Admitting: Internal Medicine

## 2012-04-17 ENCOUNTER — Other Ambulatory Visit (INDEPENDENT_AMBULATORY_CARE_PROVIDER_SITE_OTHER): Payer: Medicare Other

## 2012-04-17 ENCOUNTER — Other Ambulatory Visit: Payer: Self-pay | Admitting: Gastroenterology

## 2012-04-17 ENCOUNTER — Ambulatory Visit (INDEPENDENT_AMBULATORY_CARE_PROVIDER_SITE_OTHER): Payer: Medicare Other | Admitting: Internal Medicine

## 2012-04-17 ENCOUNTER — Telehealth: Payer: Self-pay | Admitting: Internal Medicine

## 2012-04-17 VITALS — BP 110/70 | HR 82 | Ht 67.75 in | Wt 235.4 lb

## 2012-04-17 DIAGNOSIS — K219 Gastro-esophageal reflux disease without esophagitis: Secondary | ICD-10-CM

## 2012-04-17 DIAGNOSIS — R197 Diarrhea, unspecified: Secondary | ICD-10-CM

## 2012-04-17 DIAGNOSIS — R109 Unspecified abdominal pain: Secondary | ICD-10-CM

## 2012-04-17 DIAGNOSIS — K649 Unspecified hemorrhoids: Secondary | ICD-10-CM

## 2012-04-17 LAB — COMPREHENSIVE METABOLIC PANEL
ALT: 44 U/L (ref 0–53)
AST: 31 U/L (ref 0–37)
Albumin: 4.1 g/dL (ref 3.5–5.2)
BUN: 15 mg/dL (ref 6–23)
CO2: 26 mEq/L (ref 19–32)
Calcium: 8.9 mg/dL (ref 8.4–10.5)
Chloride: 103 mEq/L (ref 96–112)
GFR: 110.23 mL/min (ref >60.00)
Potassium: 4 mEq/L (ref 3.5–5.1)

## 2012-04-17 LAB — CBC
MCHC: 33.4 g/dL (ref 30.0–36.0)
Platelets: 243 10*3/uL (ref 150.0–400.0)
RDW: 13.5 % (ref 11.5–14.6)
WBC: 7.5 10*3/uL (ref 4.5–10.5)

## 2012-04-17 LAB — TSH: TSH: 1.43 u[IU]/mL (ref 0.35–5.50)

## 2012-04-17 MED ORDER — DIPHENOXYLATE-ATROPINE 2.5-0.025 MG PO TABS: 1.0000 | ORAL_TABLET | Freq: Four times a day (QID) | ORAL | Status: DC | PRN

## 2012-04-17 MED ORDER — CHOLESTYRAMINE LIGHT 4 G PO PACK: 4.0000 g | PACK | Freq: Two times a day (BID) | ORAL | Status: DC

## 2012-04-17 MED ORDER — CHOLESTYRAMINE 4 G PO PACK: 1.0000 | PACK | Freq: Three times a day (TID) | ORAL | Status: DC

## 2012-04-17 NOTE — Patient Instructions (Addendum)
You have been scheduled for a CT scan of the abdomen and pelvis at [facility] ([address]---this is in the same building as [facility]).   You are scheduled on 04/19/2012 at 9:00am. You should arrive 15 minutes prior to your appointment time for registration. Please follow the written instructions below on the day of your exam:  WARNING: IF YOU ARE ALLERGIC TO IODINE/X-RAY DYE, PLEASE NOTIFY RADIOLOGY IMMEDIATELY AT [phone]! YOU WILL BE GIVEN A 13 HOUR PREMEDICATION PREP.  1) Do not eat or drink anything after 5:00am (4 hours prior to your test) 2) You have been given 2 bottles of oral contrast to drink. The solution may taste  better if refrigerated, but do NOT add ice or any other liquid to this solution. Shake well before drinking.    Drink 1 bottle of contrast @ 7:00 (2 hours prior to your exam)  Drink 1 bottle of contrast @ 8:00 (1 hour prior to your exam)  You may take any medications as prescribed with a small amount of water except for the following: Metformin, Glucophage, Glucovance, Avandamet, Riomet, Fortamet, Actoplus Met, Janumet, Glumetza or Metaglip. The above medications must be held the day of the exam AND 48 hours after the exam.  The purpose of you drinking the oral contrast is to aid in the visualization of your intestinal tract. The contrast solution may cause some diarrhea. Before your exam is started, you will be given a small amount of fluid to drink. Depending on your individual set of symptoms, you may also receive an intravenous injection of x-ray contrast/dye. Plan on being at [facility] for 30 minutes or long, depending on the type of exam you are having performed.  If you have any questions regarding your exam or if you need to reschedule, you may call the CT department at [phone] between the hours of 8:00 am and 5:00 pm, Monday-Friday.   Your physician has requested that you go to the basement for lab work before leaving  today.  We have sent the following medications to your pharmacy for you to pick up at your convenience: Lomotil, Cholestyramine; please take as directed   ________________________________________________________________________
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
## 2012-04-17 NOTE — Telephone Encounter (Signed)
Pharmacist from [facility] called to stay that patient was there but they did not have the rx for Lomotil he said he had been prescribed.  I double checked the office note and saw that it had been sent, did not make it to pharmacy electronically for some reason.  Gave the pharmacist the rx for Lomotil over the phone.  Pharmacist agreed to fill

## 2012-04-19 ENCOUNTER — Ambulatory Visit (INDEPENDENT_AMBULATORY_CARE_PROVIDER_SITE_OTHER)
Admission: RE | Admit: 2012-04-19 | Discharge: 2012-04-19 | Disposition: A | Discharge status: Home / Self Care | Payer: Medicare Other | Source: Ambulatory Visit | Attending: Internal Medicine | Admitting: Internal Medicine

## 2012-04-19 DIAGNOSIS — R197 Diarrhea, unspecified: Secondary | ICD-10-CM

## 2012-04-19 DIAGNOSIS — R109 Unspecified abdominal pain: Secondary | ICD-10-CM

## 2012-04-19 DIAGNOSIS — K649 Unspecified hemorrhoids: Secondary | ICD-10-CM

## 2012-04-19 DIAGNOSIS — K219 Gastro-esophageal reflux disease without esophagitis: Secondary | ICD-10-CM

## 2012-04-19 MED ORDER — IOHEXOL 300 MG/ML  SOLN
100.0000 mL | Freq: Once | INTRAMUSCULAR | Status: AC | PRN
  Administered 2012-04-19: 100 mL via INTRAVENOUS

## 2012-04-20 ENCOUNTER — Other Ambulatory Visit: Payer: Self-pay | Admitting: Gastroenterology

## 2012-04-20 ENCOUNTER — Telehealth: Payer: Self-pay | Admitting: *Deleted

## 2012-04-20 DIAGNOSIS — K529 Noninfective gastroenteritis and colitis, unspecified: Secondary | ICD-10-CM

## 2012-04-20 LAB — VISCOSITY, SERUM: Viscosity, Serum: 1.8 rel to H2O (ref 1.5–1.9)

## 2012-04-20 NOTE — Telephone Encounter (Signed)
Pt called in for results of CT; I had left a message for him to call me yesterday. I was reading Dr Lauro Franklin notes/recommendations and when I got to the sentence that we are waiting for the stool studies, he gave me a report of his experience that day, 04/17/12. He also reports he came for the same problem in 01/2011 and no follow up was never mentioned. Pt states he has no problem with Dr Rhea Belton, he likes him and stated the rest of the staff is nice, but mistakes were made. Pt had a 9am appt on 04/17/12. He was given his instruction sheets and instructed to go to the lab. He also had a CT scheduled and his AVS sheets listed the exam as 05/17/12; he called and the appt was for 04/19/12. He had to wait in the lab and his orders were wrong in the computer so our office had to be called. When he was given the containers and "hat" to collect stool specimens, he was told he could not use the lab bathroom, he was instructed to go to the Public Restroom. He wasn't given anything to pick up the specimen to place in the container, so he had to got back and ask for something. He had no way to clean up the hat and he had to leave everything as is. Now pt reports the lab states they have lost his stool studies and his blood tubes.  Pt went to the Pharmacy to pick up his meds and that order was wrong also and our office had to be called. By the time he got his meds, it was close to 1pm and he had a 9am appt here. He did state the CT office was very professional and he had no complaints with that experience. Explained to pt that Dr Rhea Belton is not here d/t a conference, but I will get with him on 04/23/12 about the problem. I have spoken with Austin Miles, Site manager, who will investigate the situation. Pt states his # of stools have not slowed down, but the Cholestyramine has firmed up the stools, but his anal area is very irritated d/t wiping/cleaning. Instructed pt to use baby wipes instead of toilet tissue to decrease  irritation. Again, I apologized to pt and informed him someone will contact him. He states he has already called Kriste Basque at [facility].

## 2012-04-20 NOTE — Telephone Encounter (Signed)
Message copied by Florene Glen on Fri Apr 20, 2012 11:13 AM ------      Message from: Beverley Fiedler      Created: Thu Apr 19, 2012  2:11 PM       CT scan did not reveal a cause for diarrhea.  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 These are almost always benign and it appears stable when compared to imaging from 2008      He does have gallstones but no evidence for gallbladder inflammation/cholecystitis      Fatty liver, but recent LFTs normal, making this finding less worrisome      CT imaging thickening of the gastric wall, but this is likely due to underdistention making this lining difficult to see for the radiologist. He's had a previous EGD which is very reassuring      CT shows necrosis/bony degeneration in the left hip. If he is having significant hip pain he can be referred to orthopedics      We are awaiting the rest of his lab tests/stool tests      Hopefully the recently started medication is helping. He has followup with me ------

## 2012-04-23 ENCOUNTER — Telehealth: Payer: Self-pay | Admitting: *Deleted

## 2012-04-23 DIAGNOSIS — D171 Benign lipomatous neoplasm of skin and subcutaneous tissue of trunk: Secondary | ICD-10-CM

## 2012-04-23 DIAGNOSIS — R300 Dysuria: Secondary | ICD-10-CM

## 2012-04-23 NOTE — Telephone Encounter (Signed)
Apparently the stool studies were not lost, ova and parasite results have returned and this is negative, fecal elastase is also normal I spoke to him at length by phone He is taking the cholestyramine twice daily and has not yet used Lomotil. He also discontinued his metformin. His blood sugars have remained in the 115 to 127 range In clinic last week he describes 24 bowel movements daily. This has improved and he reports the cholestyramine "cleared up the diarrhea". It did decrease the frequency of bowel movements, but he is still having 13-14 stools daily. He continues to feel abdominal bloating, borborygmi which is uncomfortable at times, but no abdominal pain We reviewed his CT scan, which showed normal small and large bowel. 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 He does report ongoing trouble voiding, and he wonders if this could relate to this lesion I have recommended urology referral to Dr. Mena Goes for further evaluation of urinary/bladder complaints/abnl GI imaging in left hemipelvis I have recommended that he come later this week for VIP and serum gastrin test I would like to see him back in the office next week I recommended that he at the third, middle of the day dose, of cholestyramine 4 g in an attempt to further decrease the frequency of bowel movements. For now he will not use the Lomotil Other consideration includes empiric rifaximin to try to help with abdominal bloating and diarrhea; we can discuss this further at followup He thanked me for the call, and I asked that he call us for any further questions or concerns  (urology referral, OV with me middle next week)

## 2012-04-23 NOTE — Telephone Encounter (Signed)
Pt called asking me what Dr Rhea Belton decided; appt? Advised pt that Dr Rhea Belton is aware of the problems he's had, but he has not advised me this am. I will speak with Dr Rhea Belton and call him back. [phone] Spoke with Dr Rhea Belton and informed pt Dr Rhea Belton is in procedures all morning and will call him personally around lunchtime; pt stated understanding.

## 2012-04-23 NOTE — Addendum Note (Signed)
Addended by: Florene Glen on: 04/23/2012 04:39 PM   Modules accepted: Orders

## 2012-04-23 NOTE — Telephone Encounter (Signed)
Scheduled pt to f/u with Dr Rhea Belton on 05/02/12. Informed him that [facility] is closed this PM d/t the weather, I will call as soon as I have an appt with Dr Mena Goes. Pt is aware he must fast x 4 hours or after midnight for the last 2 labs. Since our office is opening late in am, he may want to wait until he can come in one morning at 0730am; pt stated understanding.

## 2012-04-24 ENCOUNTER — Other Ambulatory Visit: Payer: Medicare Other

## 2012-04-24 DIAGNOSIS — R197 Diarrhea, unspecified: Secondary | ICD-10-CM

## 2012-04-24 DIAGNOSIS — R109 Unspecified abdominal pain: Secondary | ICD-10-CM

## 2012-04-24 DIAGNOSIS — K529 Noninfective gastroenteritis and colitis, unspecified: Secondary | ICD-10-CM

## 2012-04-24 NOTE — Telephone Encounter (Addendum)
Informed pt that he has been scheduled to see Dr Mena Goes on 05/10/12, be there at 3pm. He was also instructed to fast for 12 hours per [facility], before his Gastrin test; also needs VIP test. Pt stated understanding. Faxed ofc notes to [phone]

## 2012-04-24 NOTE — Telephone Encounter (Signed)
See next encounter note 

## 2012-04-26 ENCOUNTER — Encounter: Payer: Self-pay | Admitting: Internal Medicine

## 2012-04-26 LAB — GASTRIN: Gastrin: <15 pg/mL (ref <=100)

## 2012-05-02 ENCOUNTER — Ambulatory Visit (INDEPENDENT_AMBULATORY_CARE_PROVIDER_SITE_OTHER): Payer: Medicare Other | Admitting: Internal Medicine

## 2012-05-02 ENCOUNTER — Ambulatory Visit (INDEPENDENT_AMBULATORY_CARE_PROVIDER_SITE_OTHER)
Admission: RE | Admit: 2012-05-02 | Discharge: 2012-05-02 | Disposition: A | Discharge status: Home / Self Care | Payer: Medicare Other | Source: Ambulatory Visit | Attending: Internal Medicine | Admitting: Internal Medicine

## 2012-05-02 ENCOUNTER — Encounter: Payer: Self-pay | Admitting: Internal Medicine

## 2012-05-02 VITALS — BP 120/60 | HR 104 | Ht 66.75 in | Wt 232.5 lb

## 2012-05-02 DIAGNOSIS — R143 Flatulence: Secondary | ICD-10-CM

## 2012-05-02 DIAGNOSIS — R140 Abdominal distension (gaseous): Secondary | ICD-10-CM

## 2012-05-02 DIAGNOSIS — R142 Eructation: Secondary | ICD-10-CM

## 2012-05-02 DIAGNOSIS — R933 Abnormal findings on diagnostic imaging of other parts of digestive tract: Secondary | ICD-10-CM

## 2012-05-02 DIAGNOSIS — K529 Noninfective gastroenteritis and colitis, unspecified: Secondary | ICD-10-CM

## 2012-05-02 DIAGNOSIS — R197 Diarrhea, unspecified: Secondary | ICD-10-CM

## 2012-05-02 DIAGNOSIS — R141 Gas pain: Secondary | ICD-10-CM

## 2012-05-02 MED ORDER — CHOLESTYRAMINE LIGHT 4 G PO PACK: 4.0000 g | PACK | Freq: Two times a day (BID) | ORAL | Status: DC

## 2012-05-02 NOTE — Patient Instructions (Addendum)
Please go down to the radiology located in the basement.    Reminder: been scheduled to see Dr Mena Goes on 05/10/12, be there at 3pm.

## 2012-05-02 NOTE — Progress Notes (Signed)
Subjective:    Patient ID: Stephen Herrera, male    DOB: 05/11/1955, 57 y.o.   MRN: [id number]  HPI Stephen Herrera is a 57 yo male with PMH of GERD, IBS, hyperlipidemia, CAD, adenomatous colon polyps who seen in followup.  Since last being seen he underwent a CT scan of the abdomen and pelvis which did not reveal any findings to explain explosive diarrhea (he did have cholelithiasis, hepatic steatosis, gastric wall thickening felt secondary to underdistention, likely pelvic sidewall lipoma displacing the urinary bladder and left ureter, and left femoral avascular necrosis at head of femur).  He also had a negative stool for ova and parasite, negative VIP and serum gastrin test.  His other labs including metabolic panel and CBC were reassuring.  He was started on cholestyramine after his last appointment. Today he reports his diarrhea has significantly improved. He was having 24 explosive bowel movements daily and he is now having 4 soft but formed stools daily. Occasionally he will feel constipated and other times have loose stools, but on the whole things are much improved. He did stop his metformin entirely as well. He is using Nexium on a more as-needed basis than daily basis, but GERD has been controlled. He does continue to have significant postprandial abdominal bloating and distention. Occasionally this is so severe that he feels as if it pushes on his lungs and makes it more difficult to breathe. He has a good appetite, but often feels full after eating which he relates to the bloating.  No significant nausea or vomiting.  No fevers or chills. No rectal bleeding or melena.  He has an upcoming appointment with Dr. Sharl Ma with [facility] to further discuss his diabetes. He has noticed that his sugars have become more elevated since stopping metformin and he has tried to increase the dose of his NPH insulin to accommodate for this increase. He has noted that over the past year to year and a half his  diabetes has required more and more effort to control and this is the same time frame for his GI complaints as described above and in previous notes.  He denies left hip pain, asked because of the avascular necrosis seen by recent CT  Review of Systems As per history of present illness, otherwise negative  Current Medications, Allergies, Past Medical History, Past Surgical History, Family History and Social History were reviewed in [employer] record.     Objective:   Physical Exam BP 120/60  Pulse 104  Ht 5' 6.75" (1.695 m)  Wt 232 lb 8 oz (105.461 kg)  BMI 36.71 kg/m2 Constitutional: Well-developed and well-nourished. No distress. HEENT: Normocephalic and atraumatic. No scleral icterus. Cardiovascular: Normal rate, regular rhythm and intact distal pulses. No M/R/G Pulmonary/chest: Effort normal and breath sounds normal. No wheezing, rales or rhonchi. Abdominal: Soft, moderate distention the day with increased tympany, no overt tenderness. Bowel sounds active throughout. Extremities: no clubbing, cyanosis, or edema Skin: Skin is warm and dry. No rashes noted. Psychiatric: Normal mood and affect. Behavior is normal.  CBC    Component Value Date/Time   WBC 7.5 04/17/2012 1037   RBC 4.94 04/17/2012 1037   HGB 14.4 04/17/2012 1037   HCT 43.0 04/17/2012 1037   PLT 243.0 04/17/2012 1037   MCV 86.9 04/17/2012 1037   MCHC 33.4 04/17/2012 1037   RDW 13.5 04/17/2012 1037   LYMPHSABS 2.3 06/03/2010 1444   MONOABS 0.5 06/03/2010 1444   EOSABS 0.2 06/03/2010 1444  BASOSABS 0.0 06/03/2010 1444    CMP     Component Value Date/Time   NA 138 04/17/2012 1037   K 4.0 04/17/2012 1037   CL 103 04/17/2012 1037   CO2 26 04/17/2012 1037   GLUCOSE 124* 04/17/2012 1037   BUN 15 04/17/2012 1037   CREATININE 0.8 04/17/2012 1037   CALCIUM 8.9 04/17/2012 1037   PROT 7.9 04/17/2012 1037   ALBUMIN 4.1 04/17/2012 1037   AST 31 04/17/2012 1037   ALT 44 04/17/2012 1037   ALKPHOS 60 04/17/2012  1037   BILITOT 0.5 04/17/2012 1037   GFRNONAA >60 11/15/2006 1115   GFRAA  Value: >60        The eGFR has been calculated using the MDRD equation. This calculation has not been validated in all clinical 11/15/2006 1115    VIP - normal Gastrin (fasting) - normal Prior urine 5-HIAA - normal  Erythrocyte Sedimentation Rate     Component Value Date/Time   ESRSEDRATE 26* 06/03/2011 0923   Stool O/P - negative  CT ABDOMEN AND PELVIS WITH CONTRAST   Technique:  Multidetector CT imaging of the abdomen and pelvis was performed following the standard protocol during bolus administration of intravenous contrast.   Contrast: OMNIPAQUE IOHEXOL 300 MG/ML  SOLN   Comparison: Lumbar spine MR of 09/02/2006.   Findings: Lung bases:  Clear lung bases.  Borderline cardiomegaly, without pericardial or pleural effusion.   Abdomen/pelvis:  Moderate hepatic steatosis with hepatomegaly, the liver measuring 21.7 cm cranial caudal.  No focal liver lesion. Normal spleen.  Apparent gastric wall thickening is favored to be due to underdistension.  Normal pancreas.  Cholelithiasis without acute cholecystitis or biliary ductal dilatation.   Normal adrenal glands and kidneys, without hydronephrosis.   No retroperitoneal or retrocrural adenopathy.   Normal colon, appendix, and terminal ileum.  Normal small bowel without abdominal ascites.   Fat containing left inguinal hernia. No pelvic adenopathy.   There is mass effect from the left pelvic sidewall secondary to an area of fat density.  This measures on the order of 4.1 x 5.9 cm on image 77/series 2.  This displaces the urinary bladder to the right and the left ureter medially.  No soft tissue components are seen. No secondary hydroureter.  When comparing today's study to the coronal scout view of the 09/02/2006 lumbar spine MRI, the appearance is felt to be grossly similar (image 6/series one of that exam).   Normal prostate, without  significant free pelvic fluid.   Bones/Musculoskeletal:  No avascular necrosis of the left femoral heads on image 78/series 2.  Degenerative partial fusion of the bilateral sacroiliac joints.  Prior left-sided lumbar laminectomy.   IMPRESSION:   1.  Cholelithiasis without acute cholecystitis. 2.  Hepatic steatosis and hepatomegaly. 3.  No other explanation for abdominal pain or chronic diarrhea. 4.  Apparent gastric wall thickening, favored to be due to underdistension.  Mild gastritis cannot be excluded.  5.  Atypical appearance of the left hemi pelvis, favored to be due to a pelvic side wall lipoma.  This causes rightward displacement of the urinary bladder and distal left ureter.  Felt to be grossly similar back to the scout view of the 09/02/2006 lumbar spine MR. 6.  Left femoral head avascular necrosis.      Assessment & Plan:  57 yo male with PMH of GERD, IBS, hyperlipidemia, CAD, adenomatous colon polyps who seen in followup.  1.  Explosive diarrhea -- workup to this point has been unrevealing  but he's had an excellent response to cholestyramine and discontinuation of metformin. Other workup as described in the history of present illness has been negative for a cause of his diarrhea. We did discuss how autonomic neuropathy associated with his diabetes could also explain some of his gut motility issues.  For now he will continue with cholestyramine twice daily and can use Lomotil if needed to curb acute diarrhea.  2.  Abdominal bloating -- this continues to be a significant issue for him, particularly postprandially. We have discussed possible small intestinal bacterial overgrowth or even gastroparesis in the setting of diabetes. It does seem that his abdominal bloating is worse as his sugars are more elevated. For now we will await Dr. Daune Perch input from a diabetes standpoint. We may consider empiric treatment for intestinal overgrowth with rifaximin in the future. We also discussed  possible gastric emptying study to evaluate for gastroparesis, but again we'll wait until after endocrinology evaluation. I will do a two-view abdomen films today given his significant distention, though my suspicion for obstruction is very low.  Finally, the cholestyramine could be contributing to some of his additional bloating, but for now it is helping so much with diarrhea that we will continue it as outlined in #1  3.  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 We appreciate their input.  He will contact us after his appointment with Dr. Sharl Ma, and he is reminded to contact us if symptoms worsen or change in any way.

## 2012-05-15 ENCOUNTER — Encounter: Payer: Self-pay | Admitting: Internal Medicine

## 2012-05-15 ENCOUNTER — Ambulatory Visit (INDEPENDENT_AMBULATORY_CARE_PROVIDER_SITE_OTHER): Payer: Medicare Other | Admitting: Internal Medicine

## 2012-05-15 VITALS — BP 110/72 | HR 115 | Temp 98.3°F | Ht 66.75 in | Wt 232.0 lb

## 2012-05-15 DIAGNOSIS — R21 Rash and other nonspecific skin eruption: Secondary | ICD-10-CM

## 2012-05-15 MED ORDER — TRIAMCINOLONE ACETONIDE 0.1 % EX CREA: TOPICAL_CREAM | Freq: Two times a day (BID) | CUTANEOUS | Status: DC

## 2012-05-15 MED ORDER — METHYLPREDNISOLONE ACETATE 80 MG/ML IJ SUSP
80.0000 mg | Freq: Once | INTRAMUSCULAR | Status: AC
  Administered 2012-05-15: 80 mg via INTRAMUSCULAR
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
## 2012-05-15 NOTE — Patient Instructions (Signed)
Rash  A rash is a change in the color or texture of your skin. There are many different types of rashes. You may have other problems that accompany your rash.  CAUSES     Infections.   Allergic reactions. This can include allergies to pets or foods.   Certain medicines.   Exposure to certain chemicals, soaps, or cosmetics.   Heat.   Exposure to poisonous plants.   Tumors, both cancerous and noncancerous.  SYMPTOMS     Redness.   Scaly skin.   Itchy skin.   Dry or cracked skin.   Bumps.   Blisters.   Pain.  DIAGNOSIS   Your caregiver may do a physical exam to determine what type of rash you have. A skin sample (biopsy) may be taken and examined under a microscope.  TREATMENT    Treatment depends on the type of rash you have. Your caregiver may prescribe certain medicines. For serious conditions, you may need to see a skin doctor (dermatologist).  HOME CARE INSTRUCTIONS     Avoid the substance that caused your rash.   Do not scratch your rash. This can cause infection.   You may take cool baths to help stop itching.   Only take over-the-counter or prescription medicines as directed by your caregiver.   Keep all follow-up appointments as directed by your caregiver.  SEEK IMMEDIATE MEDICAL CARE IF:   You have increasing pain, swelling, or redness.   You have a fever.   You have new or severe symptoms.   You have body aches, diarrhea, or vomiting.   Your rash is not better after 3 days.  MAKE SURE YOU:   Understand these instructions.   Will watch your condition.   Will get help right away if you are not doing well or get worse.  Document Released: 01/28/2002 Document Revised: 05/02/2011 Document Reviewed: 11/22/2010  [employer] Patient Information 2013 [employer].

## 2012-10-12 ENCOUNTER — Telehealth: Payer: Self-pay | Admitting: Internal Medicine

## 2012-10-12 NOTE — Telephone Encounter (Signed)
Pt wants his Lipitor ordered through patient assistance.

## 2012-10-17 NOTE — Telephone Encounter (Signed)
Per [employer] Lipitor is no longer offered through patient assistance, contacted Patient and he is going to [employer] to verify

## 2012-12-27 ENCOUNTER — Other Ambulatory Visit: Payer: Self-pay

## 2013-01-15 ENCOUNTER — Other Ambulatory Visit: Payer: Self-pay | Admitting: Neurosurgery

## 2013-01-15 DIAGNOSIS — M47812 Spondylosis without myelopathy or radiculopathy, cervical region: Secondary | ICD-10-CM

## 2013-01-18 ENCOUNTER — Ambulatory Visit
Admission: RE | Admit: 2013-01-18 | Discharge: 2013-01-18 | Disposition: A | Discharge status: Home / Self Care | Payer: Medicare Other | Source: Ambulatory Visit | Attending: Neurosurgery | Admitting: Neurosurgery

## 2013-01-18 DIAGNOSIS — M47812 Spondylosis without myelopathy or radiculopathy, cervical region: Secondary | ICD-10-CM

## 2013-01-28 ENCOUNTER — Other Ambulatory Visit: Payer: Self-pay | Admitting: Neurosurgery

## 2013-01-28 DIAGNOSIS — M47817 Spondylosis without myelopathy or radiculopathy, lumbosacral region: Secondary | ICD-10-CM

## 2013-02-03 ENCOUNTER — Ambulatory Visit
Admission: RE | Admit: 2013-02-03 | Discharge: 2013-02-03 | Disposition: A | Discharge status: Home / Self Care | Payer: Medicare Other | Source: Ambulatory Visit | Attending: Neurosurgery | Admitting: Neurosurgery

## 2013-02-03 DIAGNOSIS — M47817 Spondylosis without myelopathy or radiculopathy, lumbosacral region: Secondary | ICD-10-CM

## 2013-09-19 ENCOUNTER — Other Ambulatory Visit: Payer: Self-pay | Admitting: Neurosurgery

## 2013-09-19 ENCOUNTER — Ambulatory Visit
Admission: RE | Admit: 2013-09-19 | Discharge: 2013-09-19 | Disposition: A | Discharge status: Home / Self Care | Payer: No Typology Code available for payment source | Source: Ambulatory Visit | Attending: Neurosurgery | Admitting: Neurosurgery

## 2013-09-19 DIAGNOSIS — M47812 Spondylosis without myelopathy or radiculopathy, cervical region: Secondary | ICD-10-CM

## 2013-10-24 ENCOUNTER — Other Ambulatory Visit: Payer: Self-pay | Admitting: Neurosurgery

## 2013-10-24 DIAGNOSIS — R1310 Dysphagia, unspecified: Secondary | ICD-10-CM

## 2013-10-31 ENCOUNTER — Other Ambulatory Visit: Payer: No Typology Code available for payment source

## 2013-12-06 ENCOUNTER — Other Ambulatory Visit: Payer: Self-pay

## 2014-01-15 ENCOUNTER — Telehealth: Payer: Self-pay | Admitting: Internal Medicine

## 2014-01-15 NOTE — Telephone Encounter (Signed)
Received call per Dr Lindwood Qua  Pt with day and night sweats of unclear etiology - has had signifcant recent evaluation for mult conditions that have been ruled out, does not think any w/d symtpoms; also for CT abd per Dr Roy/NS soon as part of eval for dysphagia  I suggested cxr to r/o occult neoplasm and esr/crp, and have pt make ROV, last seen here Nov 2013; Dr Buddy Duty will have pt make appt by calling

## 2014-04-30 ENCOUNTER — Encounter: Payer: Self-pay | Admitting: Internal Medicine

## 2014-04-30 ENCOUNTER — Other Ambulatory Visit: Payer: Self-pay | Admitting: Internal Medicine

## 2014-04-30 ENCOUNTER — Ambulatory Visit (INDEPENDENT_AMBULATORY_CARE_PROVIDER_SITE_OTHER): Payer: Medicare HMO | Admitting: Internal Medicine

## 2014-04-30 ENCOUNTER — Other Ambulatory Visit (INDEPENDENT_AMBULATORY_CARE_PROVIDER_SITE_OTHER): Payer: Medicare HMO

## 2014-04-30 ENCOUNTER — Ambulatory Visit (INDEPENDENT_AMBULATORY_CARE_PROVIDER_SITE_OTHER)
Admission: RE | Admit: 2014-04-30 | Discharge: 2014-04-30 | Disposition: A | Discharge status: Home / Self Care | Payer: Medicare HMO | Source: Ambulatory Visit | Attending: Internal Medicine | Admitting: Internal Medicine

## 2014-04-30 VITALS — BP 122/70 | HR 108 | Temp 99.0°F | Resp 20 | Ht 67.0 in | Wt 231.2 lb

## 2014-04-30 DIAGNOSIS — Z125 Encounter for screening for malignant neoplasm of prostate: Secondary | ICD-10-CM

## 2014-04-30 DIAGNOSIS — Z0000 Encounter for general adult medical examination without abnormal findings: Secondary | ICD-10-CM | POA: Diagnosis not present

## 2014-04-30 DIAGNOSIS — L74519 Primary focal hyperhidrosis, unspecified: Secondary | ICD-10-CM

## 2014-04-30 DIAGNOSIS — R972 Elevated prostate specific antigen [PSA]: Secondary | ICD-10-CM

## 2014-04-30 DIAGNOSIS — R5383 Other fatigue: Secondary | ICD-10-CM

## 2014-04-30 DIAGNOSIS — R61 Generalized hyperhidrosis: Secondary | ICD-10-CM

## 2014-04-30 LAB — PSA: PSA: 1.91 ng/mL (ref 0.10–4.00)

## 2014-04-30 NOTE — Progress Notes (Signed)
Pre visit review using our clinic review tool, if applicable. No additional management support is needed unless otherwise documented below in the visit note. 

## 2014-04-30 NOTE — Patient Instructions (Addendum)
Please continue all other medications as before, and refills have been done if requested.  Please have the pharmacy call with any other refills you may need.  Please continue your efforts at being more active, low cholesterol diet, and weight control.  You are otherwise up to date with prevention measures today.  Please keep your appointments with your specialists as you may have planned  Please go to the XRAY Department in the Basement (go straight as you get off the elevator) for the x-ray testing  Please go to the LAB in the Basement (turn left off the elevator) for the tests to be done today  You will be contacted by phone if any changes need to be made immediately.  Otherwise, you will receive a letter about your results with an explanation, but please check with MyChart first.  Please remember to sign up for MyChart if you have not done so, as this will be important to you in the future with finding out test results, communicating by private email, and scheduling acute appointments online when needed.  You will be contacted regarding the referral for: [facility] endocrinology  Please return in 1 year for your yearly visit, or sooner if needed
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
## 2014-04-30 NOTE — Assessment & Plan Note (Signed)

## 2014-08-12 ENCOUNTER — Telehealth: Payer: Self-pay

## 2014-08-12 NOTE — Telephone Encounter (Signed)
Pt stated that she would come into the walk in lab and get her HgA1c rechecked

## 2014-08-18 ENCOUNTER — Other Ambulatory Visit: Payer: Self-pay

## 2014-09-12 ENCOUNTER — Emergency Department (HOSPITAL_COMMUNITY): Payer: Medicare HMO

## 2014-09-12 ENCOUNTER — Observation Stay (HOSPITAL_COMMUNITY)
Admission: EM | Admit: 2014-09-12 | Discharge: 2014-09-14 | Disposition: A | Discharge status: Home / Self Care | Payer: Medicare HMO | Attending: Internal Medicine | Admitting: Internal Medicine

## 2014-09-12 ENCOUNTER — Encounter (HOSPITAL_COMMUNITY): Payer: Self-pay | Admitting: Emergency Medicine

## 2014-09-12 DIAGNOSIS — R0789 Other chest pain: Secondary | ICD-10-CM | POA: Diagnosis not present

## 2014-09-12 DIAGNOSIS — E785 Hyperlipidemia, unspecified: Secondary | ICD-10-CM | POA: Insufficient documentation

## 2014-09-12 DIAGNOSIS — E291 Testicular hypofunction: Secondary | ICD-10-CM | POA: Diagnosis present

## 2014-09-12 DIAGNOSIS — F419 Anxiety disorder, unspecified: Secondary | ICD-10-CM | POA: Diagnosis not present

## 2014-09-12 DIAGNOSIS — J449 Chronic obstructive pulmonary disease, unspecified: Secondary | ICD-10-CM | POA: Insufficient documentation

## 2014-09-12 DIAGNOSIS — Z7982 Long term (current) use of aspirin: Secondary | ICD-10-CM | POA: Diagnosis not present

## 2014-09-12 DIAGNOSIS — I2510 Atherosclerotic heart disease of native coronary artery without angina pectoris: Secondary | ICD-10-CM | POA: Diagnosis not present

## 2014-09-12 DIAGNOSIS — Z794 Long term (current) use of insulin: Secondary | ICD-10-CM | POA: Insufficient documentation

## 2014-09-12 DIAGNOSIS — G4733 Obstructive sleep apnea (adult) (pediatric): Secondary | ICD-10-CM | POA: Diagnosis not present

## 2014-09-12 DIAGNOSIS — R079 Chest pain, unspecified: Secondary | ICD-10-CM | POA: Diagnosis present

## 2014-09-12 DIAGNOSIS — G9332 Myalgic encephalomyelitis/chronic fatigue syndrome: Secondary | ICD-10-CM | POA: Diagnosis present

## 2014-09-12 DIAGNOSIS — Z87891 Personal history of nicotine dependence: Secondary | ICD-10-CM | POA: Insufficient documentation

## 2014-09-12 DIAGNOSIS — R5382 Chronic fatigue, unspecified: Secondary | ICD-10-CM | POA: Diagnosis present

## 2014-09-12 DIAGNOSIS — E039 Hypothyroidism, unspecified: Secondary | ICD-10-CM | POA: Diagnosis present

## 2014-09-12 DIAGNOSIS — R55 Syncope and collapse: Secondary | ICD-10-CM | POA: Diagnosis present

## 2014-09-12 DIAGNOSIS — Z8249 Family history of ischemic heart disease and other diseases of the circulatory system: Secondary | ICD-10-CM | POA: Insufficient documentation

## 2014-09-12 DIAGNOSIS — G8929 Other chronic pain: Secondary | ICD-10-CM | POA: Diagnosis not present

## 2014-09-12 DIAGNOSIS — R5383 Other fatigue: Secondary | ICD-10-CM | POA: Insufficient documentation

## 2014-09-12 DIAGNOSIS — D751 Secondary polycythemia: Secondary | ICD-10-CM | POA: Diagnosis not present

## 2014-09-12 DIAGNOSIS — E119 Type 2 diabetes mellitus without complications: Secondary | ICD-10-CM | POA: Diagnosis not present

## 2014-09-12 LAB — BASIC METABOLIC PANEL
Anion gap: 16 — ABNORMAL HIGH (ref 5–15)
BUN: 18 mg/dL (ref 6–20)
CO2: 20 mmol/L — ABNORMAL LOW (ref 22–32)
Calcium: 9.4 mg/dL (ref 8.9–10.3)
Chloride: 102 mmol/L (ref 101–111)
Creatinine, Ser: 1.15 mg/dL (ref 0.61–1.24)
GFR calc Af Amer: >60 mL/min (ref >60)
GFR calc non Af Amer: >60 mL/min (ref >60)
GLUCOSE: 144 mg/dL — AB (ref 65–99)
Potassium: 3.7 mmol/L (ref 3.5–5.1)
Sodium: 138 mmol/L (ref 135–145)

## 2014-09-12 LAB — CBC
HEMATOCRIT: 54.9 % — AB (ref 39.0–52.0)
Hemoglobin: 19.2 g/dL — ABNORMAL HIGH (ref 13.0–17.0)
MCH: 30.8 pg (ref 26.0–34.0)
MCHC: 35 g/dL (ref 30.0–36.0)
MCV: 88 fL (ref 78.0–100.0)
PLATELETS: 259 10*3/uL (ref 150–400)
RBC: 6.24 MIL/uL — ABNORMAL HIGH (ref 4.22–5.81)
RDW: 13.3 % (ref 11.5–15.5)
WBC: 10.1 10*3/uL (ref 4.0–10.5)

## 2014-09-12 LAB — I-STAT TROPONIN, ED: Troponin i, poc: 0 ng/mL (ref 0.00–0.08)

## 2014-09-12 LAB — CBG MONITORING, ED
GLUCOSE-CAPILLARY: 111 mg/dL — AB (ref 65–99)
Glucose-Capillary: 98 mg/dL (ref 65–99)

## 2014-09-12 MED ORDER — IOHEXOL 350 MG/ML SOLN
100.0000 mL | Freq: Once | INTRAVENOUS | Status: AC | PRN
  Administered 2014-09-12: 100 mL via INTRAVENOUS

## 2014-09-12 MED ORDER — IOHEXOL 300 MG/ML  SOLN: 100.0000 mL | Freq: Once | INTRAMUSCULAR | Status: DC | PRN

## 2014-09-12 NOTE — ED Notes (Signed)
MD at bedside. 

## 2014-09-12 NOTE — ED Notes (Signed)
C/o sob, fatigue, weight loss, and intermittent episodes of diaphoresis since January.  States he has been going to doctor every 2 weeks for same.  Reports tightness in center of chest with nausea since noon today.  Vomited x 1.  Pt hyperventilating on arrival.  Pt had syncopal episode prior to arrival while carrying a pitcher of tea inside.  Family member denies LOC but states pt was just "really upset."  Denies injury from fall.

## 2014-09-12 NOTE — ED Notes (Addendum)
Checked CBG at pt request 111
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
## 2014-09-13 ENCOUNTER — Encounter (HOSPITAL_COMMUNITY): Payer: Self-pay | Admitting: Internal Medicine

## 2014-09-13 ENCOUNTER — Observation Stay (HOSPITAL_BASED_OUTPATIENT_CLINIC_OR_DEPARTMENT_OTHER): Payer: Medicare HMO

## 2014-09-13 DIAGNOSIS — D751 Secondary polycythemia: Secondary | ICD-10-CM

## 2014-09-13 DIAGNOSIS — R5382 Chronic fatigue, unspecified: Secondary | ICD-10-CM | POA: Diagnosis not present

## 2014-09-13 DIAGNOSIS — G4733 Obstructive sleep apnea (adult) (pediatric): Secondary | ICD-10-CM

## 2014-09-13 DIAGNOSIS — R55 Syncope and collapse: Secondary | ICD-10-CM | POA: Diagnosis present

## 2014-09-13 DIAGNOSIS — E118 Type 2 diabetes mellitus with unspecified complications: Secondary | ICD-10-CM | POA: Diagnosis not present

## 2014-09-13 DIAGNOSIS — E039 Hypothyroidism, unspecified: Secondary | ICD-10-CM

## 2014-09-13 DIAGNOSIS — F419 Anxiety disorder, unspecified: Secondary | ICD-10-CM | POA: Diagnosis not present

## 2014-09-13 DIAGNOSIS — R079 Chest pain, unspecified: Secondary | ICD-10-CM

## 2014-09-13 DIAGNOSIS — R072 Precordial pain: Secondary | ICD-10-CM

## 2014-09-13 DIAGNOSIS — E291 Testicular hypofunction: Secondary | ICD-10-CM

## 2014-09-13 LAB — COMPREHENSIVE METABOLIC PANEL
ALBUMIN: 4.2 g/dL (ref 3.5–5.0)
ALK PHOS: 75 U/L (ref 38–126)
ALT: 25 U/L (ref 17–63)
ANION GAP: 13 (ref 5–15)
AST: 25 U/L (ref 15–41)
BUN: 17 mg/dL (ref 6–20)
CHLORIDE: 102 mmol/L (ref 101–111)
CO2: 21 mmol/L — ABNORMAL LOW (ref 22–32)
CREATININE: 1.05 mg/dL (ref 0.61–1.24)
Calcium: 9.1 mg/dL (ref 8.9–10.3)
GLUCOSE: 126 mg/dL — AB (ref 65–99)
Potassium: 3.5 mmol/L (ref 3.5–5.1)
Sodium: 136 mmol/L (ref 135–145)
Total Bilirubin: 1.6 mg/dL — ABNORMAL HIGH (ref 0.3–1.2)
Total Protein: 7.9 g/dL (ref 6.5–8.1)

## 2014-09-13 LAB — CBC WITH DIFFERENTIAL/PLATELET
BASOS ABS: 0 10*3/uL (ref 0.0–0.1)
Basophils Relative: 0 % (ref 0–1)
EOS PCT: 1 % (ref 0–5)
Eosinophils Absolute: 0.1 10*3/uL (ref 0.0–0.7)
HEMATOCRIT: 54.8 % — AB (ref 39.0–52.0)
Hemoglobin: 18.8 g/dL — ABNORMAL HIGH (ref 13.0–17.0)
Lymphocytes Relative: 20 % (ref 12–46)
Lymphs Abs: 2 10*3/uL (ref 0.7–4.0)
MCH: 30.4 pg (ref 26.0–34.0)
MCHC: 34.3 g/dL (ref 30.0–36.0)
MCV: 88.7 fL (ref 78.0–100.0)
MONO ABS: 0.7 10*3/uL (ref 0.1–1.0)
Monocytes Relative: 7 % (ref 3–12)
Neutro Abs: 7 10*3/uL (ref 1.7–7.7)
Neutrophils Relative %: 72 % (ref 43–77)
Platelets: 215 10*3/uL (ref 150–400)
RBC: 6.18 MIL/uL — ABNORMAL HIGH (ref 4.22–5.81)
RDW: 13.5 % (ref 11.5–15.5)
WBC: 9.8 10*3/uL (ref 4.0–10.5)

## 2014-09-13 LAB — GLUCOSE, CAPILLARY
GLUCOSE-CAPILLARY: 49 mg/dL — AB (ref 65–99)
GLUCOSE-CAPILLARY: 66 mg/dL (ref 65–99)
GLUCOSE-CAPILLARY: 93 mg/dL (ref 65–99)
GLUCOSE-CAPILLARY: 168 mg/dL — AB (ref 65–99)
Glucose-Capillary: 117 mg/dL — ABNORMAL HIGH (ref 65–99)
Glucose-Capillary: 130 mg/dL — ABNORMAL HIGH (ref 65–99)
Glucose-Capillary: 221 mg/dL — ABNORMAL HIGH (ref 65–99)
Glucose-Capillary: 167 mg/dL — ABNORMAL HIGH (ref 65–99)
Glucose-Capillary: 126 mg/dL — ABNORMAL HIGH (ref 65–99)

## 2014-09-13 LAB — TSH: TSH: 1.48 u[IU]/mL (ref 0.350–4.500)

## 2014-09-13 LAB — BRAIN NATRIURETIC PEPTIDE: B NATRIURETIC PEPTIDE 5: 4.6 pg/mL (ref 0.0–100.0)

## 2014-09-13 LAB — TROPONIN I
Troponin I: <0.03 ng/mL (ref <0.031)
Troponin I: <0.03 ng/mL (ref <0.031)
Troponin I: <0.03 ng/mL (ref <0.031)

## 2014-09-13 MED ORDER — ONDANSETRON HCL 4 MG/2ML IJ SOLN
4.0000 mg | Freq: Three times a day (TID) | INTRAMUSCULAR | Status: DC | PRN
Start: 2014-09-13 — End: 2014-09-13

## 2014-09-13 MED ORDER — INSULIN ASPART 100 UNIT/ML ~~LOC~~ SOLN: 0.0000 [IU] | Freq: Three times a day (TID) | SUBCUTANEOUS | Status: DC

## 2014-09-13 MED ORDER — DEXTROSE 50 % IV SOLN
INTRAVENOUS | Status: AC
  Administered 2014-09-13 (×2): 25 mL
  Filled 2014-09-13: qty 50

## 2014-09-13 MED ORDER — INSULIN ASPART 100 UNIT/ML ~~LOC~~ SOLN: 0.0000 [IU] | Freq: Every day | SUBCUTANEOUS | Status: DC

## 2014-09-13 MED ORDER — MORPHINE SULFATE 2 MG/ML IJ SOLN: 2.0000 mg | INTRAMUSCULAR | Status: DC | PRN

## 2014-09-13 MED ORDER — ENOXAPARIN SODIUM 40 MG/0.4ML ~~LOC~~ SOLN
40.0000 mg | SUBCUTANEOUS | Status: DC
  Administered 2014-09-13 – 2014-09-14 (×2): 40 mg via SUBCUTANEOUS
  Filled 2014-09-13 (×2): qty 0.4

## 2014-09-13 MED ORDER — ACETAMINOPHEN 325 MG PO TABS: 650.0000 mg | ORAL_TABLET | ORAL | Status: DC | PRN

## 2014-09-13 MED ORDER — HYDROCODONE-ACETAMINOPHEN 10-325 MG PO TABS: 1.0000 | ORAL_TABLET | Freq: Four times a day (QID) | ORAL | Status: DC | PRN

## 2014-09-13 MED ORDER — INSULIN ASPART PROT & ASPART (70-30 MIX) 100 UNIT/ML ~~LOC~~ SUSP
50.0000 [IU] | Freq: Two times a day (BID) | SUBCUTANEOUS | Status: DC
  Administered 2014-09-13: 50 [IU] via SUBCUTANEOUS
  Filled 2014-09-13: qty 10

## 2014-09-13 MED ORDER — ATORVASTATIN CALCIUM 40 MG PO TABS
40.0000 mg | ORAL_TABLET | Freq: Every day | ORAL | Status: DC
  Administered 2014-09-13: 40 mg via ORAL
  Filled 2014-09-13 (×2): qty 1

## 2014-09-13 MED ORDER — ONDANSETRON HCL 4 MG/2ML IJ SOLN: 4.0000 mg | Freq: Four times a day (QID) | INTRAMUSCULAR | Status: DC | PRN

## 2014-09-13 MED ORDER — ASPIRIN EC 81 MG PO TBEC
81.0000 mg | DELAYED_RELEASE_TABLET | Freq: Every day | ORAL | Status: DC
  Administered 2014-09-13 – 2014-09-14 (×2): 81 mg via ORAL
  Filled 2014-09-13 (×2): qty 1

## 2014-09-13 MED ORDER — ALPRAZOLAM 0.25 MG PO TABS
0.2500 mg | ORAL_TABLET | Freq: Two times a day (BID) | ORAL | Status: DC | PRN
  Administered 2014-09-13 – 2014-09-14 (×3): 0.25 mg via ORAL
  Filled 2014-09-13 (×3): qty 1

## 2014-09-13 MED ORDER — INSULIN ASPART 100 UNIT/ML ~~LOC~~ SOLN: 0.0000 [IU] | Freq: Four times a day (QID) | SUBCUTANEOUS | Status: DC
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
## 2014-09-13 NOTE — Progress Notes (Signed)
[facility] PROGRESS NOTE  AZARI HASLER YTK:[id number] DOB: Apr 29, 1955 DOA: 09/12/2014 PCP: Cathlean Cower, MD  Assessment/Plan: 1. Chest pain. -Patient presenting with complaints of chest pain seemingly having atypical features. -Initial workup was negative, which included troponins, CXR and EKG -He continued to have chest pain this morning.  -He appears quite anxious and wonder the possibility of this representing pakick attacks.  -Having multiple cardiovascular risk factors will undergo Stress Test in AM.   2.   Diabetes mellitus -Patient on insulin 70/30 50 units subcutaneous twice a day with meals -Blood sugars controlled -Will provide sliding scale coverage as needed  3.  Generalized anxiety disorder. -Continue Xanax 0.25 mg by mouth twice a day as needed  4.  Dyslipidemia -Continue atorvastatin 40 mg by mouth daily at bedtime  Code Status: Full code Family Communication:  Disposition Plan:    Consultants:  Cardiology  Procedures:    Antibiotics:    HPI/Subjective: Patient is a pleasant 59 year old gentleman with a past medical history of diabetes mellitus, history of nonobstructive coronary disease, generalized exam started, admitted to medicine service on 09/13/2014 when he presented with complaints of chest pain located in epigastric region characterized as tight, pressure-like that was associated with shortness of breath. Initial workup included a chest x-ray that did not reveal acute cardiopulmonary disease. EKG did not show acute ischemic changes. Troponins were within normal limits. Cardiology consulted  Objective: Filed Vitals:   09/13/14 0630  BP: 115/61  Pulse: 102  Temp: 98.7 F (37.1 C)  Resp: 18    Intake/Output Summary (Last 24 hours) at 09/13/14 1406 Last data filed at 09/13/14 0838  Gross per 24 hour  Intake      0 ml  Output    250 ml  Net   -250 ml   Filed Weights   09/13/14 0249  Weight: 92.579 kg (204 lb 1.6 oz)     Exam:   General:  Anxious appearing, in mild distress. Reported having ongoing retrosternal chest pain  Cardiovascular: Regular rate and rhythm normal S1-S2 no murmurs rubs or gallops, no extremity edema  Respiratory: Normal respiratory effort, lungs are clear to auscultation bilaterally  Abdomen: Soft nontender nondistended positive bowel sounds  Musculoskeletal: No edema  Data Reviewed: Basic Metabolic Panel:  Recent Labs Lab 09/12/14 2013 09/13/14 0620  NA 138 136  K 3.7 3.5  CL 102 102  CO2 20* 21*  GLUCOSE 144* 126*  BUN 18 17  CREATININE 1.15 1.05  CALCIUM 9.4 9.1   Liver Function Tests:  Recent Labs Lab 09/13/14 0620  AST 25  ALT 25  ALKPHOS 75  BILITOT 1.6*  PROT 7.9  ALBUMIN 4.2   No results for input(s): LIPASE, AMYLASE in the last 168 hours. No results for input(s): AMMONIA in the last 168 hours. CBC:  Recent Labs Lab 09/12/14 2013 09/13/14 0620  WBC 10.1 9.8  NEUTROABS  --  7.0  HGB 19.2* 18.8*  HCT 54.9* 54.8*  MCV 88.0 88.7  PLT 259 215   Cardiac Enzymes:  Recent Labs Lab 09/13/14 0620 09/13/14 1140  TROPONINI <0.03 <0.03   BNP (last 3 results)  Recent Labs  09/13/14 0620  BNP 4.6    ProBNP (last 3 results) No results for input(s): PROBNP in the last 8760 hours.  CBG:  Recent Labs Lab 09/12/14 2340 09/12/14 2343 09/13/14 0645 09/13/14 1125  GLUCAP 98 111* 117* 130*    No results found for this or any previous visit (from the past 240 hour(s)).  Studies: Dg Chest 2 View  09/12/2014   CLINICAL DATA:  Two week history of severe mid chest pain and shortness of breath. Current history of COPD and coronary artery disease.  EXAM: CHEST  2 VIEW  COMPARISON:  04/30/2014 and earlier.  FINDINGS: Cardiomediastinal silhouette unremarkable, unchanged. Nodular opacities projected over the lung bases on the PA image correspond to the nipples on the lateral image. Lungs clear. Bronchovascular markings normal. Pulmonary  vascularity normal. No visible pleural effusions. No pneumothorax. Degenerative changes and DISH involving the thoracic spine.  IMPRESSION: No acute cardiopulmonary disease. Nodular opacities projected over the lung bases on the PA image correspond to the nipples on the lateral image.   Electronically Signed   By: Evangeline Dakin M.D.   On: 09/12/2014 20:55   Ct Angio Chest Pe W/cm &/or Wo Cm  09/13/2014   CLINICAL DATA:  59 year old male with chest pain and shortness of breath. Unexpected weight loss.  EXAM: CT ANGIOGRAPHY CHEST  CT ABDOMEN AND PELVIS with contrast  TECHNIQUE: Multidetector CT imaging through the chest, abdomen and pelvis was performed using the standard protocol during bolus administration of intravenous contrast. Multiplanar reconstructed images and MIPs were obtained and reviewed to evaluate the vascular anatomy.  CONTRAST:  153mL OMNIPAQUE IOHEXOL 350 MG/ML SOLN  COMPARISON:  CT dated 04/19/2012  FINDINGS: CTA CHEST FINDINGS  The lungs are clear. No focal consolidation, pleural effusion, or pneumothorax. The central airways are patent.  The visualized thoracic aorta appears unremarkable. No CT evidence of pulmonary embolism. No hilar or mediastinal adenopathy. There is no cardiomegaly or pericardial effusion. There is coronary vascular calcification. The visualized esophagus and thyroid gland appear unremarkable.  The chest wall appears unremarkable. There is degenerative changes of the spine. Lower cervical posterior fixation hardware partially visualized. No acute fracture.  Review of the MIP images confirms the above findings.  CT ABDOMEN AND PELVIS FINDINGS  No intra-abdominal free air or free fluid.  Gallstone. No pericholecystic fluid. The liver, pancreas, spleen, adrenal glands, kidneys, visualized ureters, and urinary bladder appear unremarkable. Stable appearing subcentimeter right renal inferior pole hypodense lesion is not well characterized but likely represents a cyst. Stable  appearing left sidewall lipoma with mild mass effect and compression of the left side of the bladder wall. The prostate and seminal vesicles are grossly unremarkable.  There is no evidence of bowel obstruction or inflammation. Normal appendix.  There is aortoiliac atherosclerotic disease. The abdominal aorta and IVC appear patent. No portal venous gas identified. There is no lymphadenopathy. Small fat containing left inguinal hernia. Degenerative changes of the spine. Left femoral head avascular necrosis. No acute fracture.  Review of the MIP images confirms the above findings.  IMPRESSION: No CT evidence of pulmonary embolism or aortic dissection.  Cholelithiasis.  No evidence of bowel obstruction or inflammation.  Normal appendix.   Electronically Signed   By: Anner Crete M.D.   On: 09/13/2014 01:09   Ct Abdomen Pelvis W Contrast  09/13/2014   CLINICAL DATA:  59 year old male with chest pain and shortness of breath. Unexpected weight loss.  EXAM: CT ANGIOGRAPHY CHEST  CT ABDOMEN AND PELVIS with contrast  TECHNIQUE: Multidetector CT imaging through the chest, abdomen and pelvis was performed using the standard protocol during bolus administration of intravenous contrast. Multiplanar reconstructed images and MIPs were obtained and reviewed to evaluate the vascular anatomy.  CONTRAST:  175mL OMNIPAQUE IOHEXOL 350 MG/ML SOLN  COMPARISON:  CT dated 04/19/2012  FINDINGS: CTA CHEST FINDINGS  The lungs are  clear. No focal consolidation, pleural effusion, or pneumothorax. The central airways are patent.  The visualized thoracic aorta appears unremarkable. No CT evidence of pulmonary embolism. No hilar or mediastinal adenopathy. There is no cardiomegaly or pericardial effusion. There is coronary vascular calcification. The visualized esophagus and thyroid gland appear unremarkable.  The chest wall appears unremarkable. There is degenerative changes of the spine. Lower cervical posterior fixation hardware partially  visualized. No acute fracture.  Review of the MIP images confirms the above findings.  CT ABDOMEN AND PELVIS FINDINGS  No intra-abdominal free air or free fluid.  Gallstone. No pericholecystic fluid. The liver, pancreas, spleen, adrenal glands, kidneys, visualized ureters, and urinary bladder appear unremarkable. Stable appearing subcentimeter right renal inferior pole hypodense lesion is not well characterized but likely represents a cyst. Stable appearing left sidewall lipoma with mild mass effect and compression of the left side of the bladder wall. The prostate and seminal vesicles are grossly unremarkable.  There is no evidence of bowel obstruction or inflammation. Normal appendix.  There is aortoiliac atherosclerotic disease. The abdominal aorta and IVC appear patent. No portal venous gas identified. There is no lymphadenopathy. Small fat containing left inguinal hernia. Degenerative changes of the spine. Left femoral head avascular necrosis. No acute fracture.  Review of the MIP images confirms the above findings.  IMPRESSION: No CT evidence of pulmonary embolism or aortic dissection.  Cholelithiasis.  No evidence of bowel obstruction or inflammation.  Normal appendix.   Electronically Signed   By: Anner Crete M.D.   On: 09/13/2014 01:09    Scheduled Meds: . aspirin EC  81 mg Oral Daily  . atorvastatin  40 mg Oral QHS  . enoxaparin (LOVENOX) injection  40 mg Subcutaneous Q24H  . insulin aspart  0-9 Units Subcutaneous 4 times per day  . insulin aspart protamine- aspart  50 Units Subcutaneous BID WC   Continuous Infusions:   Principal Problem:   Chest pain Active Problems:   Chronic fatigue syndrome   Hypogonadism male   Hypothyroidism   DM (diabetes mellitus)   OSA (obstructive sleep apnea)   Anxiety   Polycythemia   Syncope    Time spent:     [place], [facility] Pager [phone]. If 7PM-7AM, please contact night-coverage at [email or web address], password  [facility] 09/13/2014, 2:06 PM

## 2014-09-13 NOTE — Progress Notes (Signed)
Echocardiogram 2D Echocardiogram has been performed.  Stephen Herrera 09/13/2014, 11:26 AM

## 2014-09-13 NOTE — Progress Notes (Signed)
Pt had hypoglycemia episode CBG was 49, gave Dextrose 50 1/2 ampule went back to 169, pt was symptomatic, MD notified, checked CBG again went to 126, gave pt some graham crackers and PB and a OJ, will continue to monitor, Thanks Arvella Nigh RN

## 2014-09-13 NOTE — Progress Notes (Signed)
The patient arrived to 3E25 from the ED at 230.  He was oriented to the unit and placed on telemetry.  CCMD was notified.  He is A&Ox4 and his VS are stable.  He is a standby assist to the bathroom and is a high falls risk.  His bed alarm was turned on and his call bell was placed within reach.

## 2014-09-14 ENCOUNTER — Observation Stay (HOSPITAL_COMMUNITY): Payer: Medicare HMO

## 2014-09-14 ENCOUNTER — Other Ambulatory Visit: Payer: Self-pay | Admitting: Physician Assistant

## 2014-09-14 DIAGNOSIS — R079 Chest pain, unspecified: Secondary | ICD-10-CM | POA: Diagnosis not present

## 2014-09-14 DIAGNOSIS — F419 Anxiety disorder, unspecified: Secondary | ICD-10-CM | POA: Diagnosis not present

## 2014-09-14 DIAGNOSIS — R55 Syncope and collapse: Secondary | ICD-10-CM

## 2014-09-14 DIAGNOSIS — E118 Type 2 diabetes mellitus with unspecified complications: Secondary | ICD-10-CM

## 2014-09-14 LAB — GLUCOSE, CAPILLARY
GLUCOSE-CAPILLARY: 105 mg/dL — AB (ref 65–99)
GLUCOSE-CAPILLARY: 188 mg/dL — AB (ref 65–99)
Glucose-Capillary: 85 mg/dL (ref 65–99)
Glucose-Capillary: 93 mg/dL (ref 65–99)

## 2014-09-14 MED ORDER — ALPRAZOLAM 0.25 MG PO TABS: 0.2500 mg | ORAL_TABLET | Freq: Two times a day (BID) | ORAL | Status: DC | PRN

## 2014-09-14 MED ORDER — TECHNETIUM TC 99M SESTAMIBI GENERIC - CARDIOLITE
10.0000 | Freq: Once | INTRAVENOUS | Status: AC | PRN
  Administered 2014-09-14: 10 via INTRAVENOUS

## 2014-09-14 MED ORDER — REGADENOSON 0.4 MG/5ML IV SOLN
0.4000 mg | Freq: Once | INTRAVENOUS | Status: AC
  Administered 2014-09-14: 0.4 mg via INTRAVENOUS
  Filled 2014-09-14: qty 5

## 2014-09-14 MED ORDER — REGADENOSON 0.4 MG/5ML IV SOLN
INTRAVENOUS | Status: AC
  Filled 2014-09-14: qty 5

## 2014-09-14 MED ORDER — TECHNETIUM TC 99M SESTAMIBI GENERIC - CARDIOLITE
30.0000 | Freq: Once | INTRAVENOUS | Status: AC | PRN
Start: 2014-09-14 — End: 2014-09-14
  Administered 2014-09-14: 30 via INTRAVENOUS

## 2014-09-14 NOTE — Progress Notes (Signed)
Patient Name: Stephen Herrera Date of Encounter: 09/14/2014  PROBLEM LIST  Principal Problem:   Chest pain Active Problems:   Syncope   Chronic fatigue syndrome   Hypogonadism male   Hypothyroidism   DM (diabetes mellitus)   OSA (obstructive sleep apnea)   Anxiety   Polycythemia     SUBJECTIVE  Still having chest tightness.  This is constant without change.  Myoview planned for today.    CURRENT MEDS . aspirin EC  81 mg Oral Daily  . atorvastatin  40 mg Oral QHS  . enoxaparin (LOVENOX) injection  40 mg Subcutaneous Q24H  . insulin aspart  0-15 Units Subcutaneous TID WC  . insulin aspart  0-5 Units Subcutaneous QHS  . insulin aspart protamine- aspart  50 Units Subcutaneous BID WC  . regadenoson        OBJECTIVE  Filed Vitals:   09/14/14 0918 09/14/14 0939 09/14/14 0941 09/14/14 0943  BP: 117/78 121/62  90/61  Pulse: 88 99 77 103  Temp:      TempSrc:      Resp:      Height:      Weight:      SpO2:        Intake/Output Summary (Last 24 hours) at 09/14/14 0944 Last data filed at 09/14/14 0600  Gross per 24 hour  Intake    720 ml  Output    700 ml  Net     20 ml   Filed Weights   09/13/14 0249  Weight: 204 lb 1.6 oz (92.579 kg)    PHYSICAL EXAM  GEN: Well nourished, well developed, in no acute distress. HEENT: normal. Neck:   no JVD  Cardiac: RRR, no murmurs, rubs, or gallops. No edema.     Respiratory:  Respirations regular and unlabored, clear to auscultation bilaterally. GI: Soft, nontender, nondistended MS: no deformity or atrophy. Skin: warm and dry, no rash. Neuro:  Strength and sensation are intact. Psych: Normal affect.  Accessory Clinical Findings  CBC  Recent Labs  09/12/14 2013 09/13/14 0620  WBC 10.1 9.8  NEUTROABS  --  7.0  HGB 19.2* 18.8*  HCT 54.9* 54.8*  MCV 88.0 88.7  PLT 259 081   Basic Metabolic Panel  Recent Labs  09/12/14 2013 09/13/14 0620  NA 138 136  K 3.7 3.5  CL 102 102  CO2 20* 21*  GLUCOSE 144*  126*  BUN 18 17  CREATININE 1.15 1.05  CALCIUM 9.4 9.1   Liver Function Tests  Recent Labs  09/13/14 0620  AST 25  ALT 25  ALKPHOS 75  BILITOT 1.6*  PROT 7.9  ALBUMIN 4.2   No results for input(s): LIPASE, AMYLASE in the last 72 hours. Cardiac Enzymes  Recent Labs  09/13/14 0620 09/13/14 1140 09/13/14 1620  TROPONINI <0.03 <0.03 <0.03   BNP (last 3 results)  Recent Labs  09/13/14 0620  BNP 4.6   D-Dimer No results for input(s): DDIMER in the last 72 hours. Hemoglobin A1C No results for input(s): HGBA1C in the last 72 hours. Fasting Lipid Panel No results for input(s): CHOL, HDL, LDLCALC, TRIG, CHOLHDL, LDLDIRECT in the last 72 hours. Thyroid Function Tests  Recent Labs  09/13/14 0620  TSH 1.480     RADIOLOGY/STUDIES  Dg Chest 2 View  09/12/2014   CLINICAL DATA:  Two week history of severe mid chest pain and shortness of breath. Current history of COPD and coronary artery disease.  EXAM: CHEST  2 VIEW  COMPARISON:  04/30/2014 and earlier.  FINDINGS: Cardiomediastinal silhouette unremarkable, unchanged. Nodular opacities projected over the lung bases on the PA image correspond to the nipples on the lateral image. Lungs clear. Bronchovascular markings normal. Pulmonary vascularity normal. No visible pleural effusions. No pneumothorax. Degenerative changes and DISH involving the thoracic spine.  IMPRESSION: No acute cardiopulmonary disease. Nodular opacities projected over the lung bases on the PA image correspond to the nipples on the lateral image.   Electronically Signed   By: Evangeline Dakin M.D.   On: 09/12/2014 20:55   Ct Angio Chest Pe W/cm &/or Wo Cm  09/13/2014   CLINICAL DATA:  59 year old male with chest pain and shortness of breath. Unexpected weight loss.  EXAM: CT ANGIOGRAPHY CHEST  CT ABDOMEN AND PELVIS with contrast  TECHNIQUE: Multidetector CT imaging through the chest, abdomen and pelvis was performed using the standard protocol during bolus  administration of intravenous contrast. Multiplanar reconstructed images and MIPs were obtained and reviewed to evaluate the vascular anatomy.  CONTRAST:  182mL OMNIPAQUE IOHEXOL 350 MG/ML SOLN  COMPARISON:  CT dated 04/19/2012  FINDINGS: CTA CHEST FINDINGS  The lungs are clear. No focal consolidation, pleural effusion, or pneumothorax. The central airways are patent.  The visualized thoracic aorta appears unremarkable. No CT evidence of pulmonary embolism. No hilar or mediastinal adenopathy. There is no cardiomegaly or pericardial effusion. There is coronary vascular calcification. The visualized esophagus and thyroid gland appear unremarkable.  The chest wall appears unremarkable. There is degenerative changes of the spine. Lower cervical posterior fixation hardware partially visualized. No acute fracture.  Review of the MIP images confirms the above findings.  CT ABDOMEN AND PELVIS FINDINGS  No intra-abdominal free air or free fluid.  Gallstone. No pericholecystic fluid. The liver, pancreas, spleen, adrenal glands, kidneys, visualized ureters, and urinary bladder appear unremarkable. Stable appearing subcentimeter right renal inferior pole hypodense lesion is not well characterized but likely represents a cyst. Stable appearing left sidewall lipoma with mild mass effect and compression of the left side of the bladder wall. The prostate and seminal vesicles are grossly unremarkable.  There is no evidence of bowel obstruction or inflammation. Normal appendix.  There is aortoiliac atherosclerotic disease. The abdominal aorta and IVC appear patent. No portal venous gas identified. There is no lymphadenopathy. Small fat containing left inguinal hernia. Degenerative changes of the spine. Left femoral head avascular necrosis. No acute fracture.  Review of the MIP images confirms the above findings.  IMPRESSION: No CT evidence of pulmonary embolism or aortic dissection.  Cholelithiasis.  No evidence of bowel obstruction  or inflammation.  Normal appendix.   Electronically Signed   By: Anner Crete M.D.   On: 09/13/2014 01:09   Ct Abdomen Pelvis W Contrast  09/13/2014   CLINICAL DATA:  59 year old male with chest pain and shortness of breath. Unexpected weight loss.  EXAM: CT ANGIOGRAPHY CHEST  CT ABDOMEN AND PELVIS with contrast  TECHNIQUE: Multidetector CT imaging through the chest, abdomen and pelvis was performed using the standard protocol during bolus administration of intravenous contrast. Multiplanar reconstructed images and MIPs were obtained and reviewed to evaluate the vascular anatomy.  CONTRAST:  120mL OMNIPAQUE IOHEXOL 350 MG/ML SOLN  COMPARISON:  CT dated 04/19/2012  FINDINGS: CTA CHEST FINDINGS  The lungs are clear. No focal consolidation, pleural effusion, or pneumothorax. The central airways are patent.  The visualized thoracic aorta appears unremarkable. No CT evidence of pulmonary embolism. No hilar or mediastinal adenopathy. There is no cardiomegaly or pericardial effusion. There is coronary  vascular calcification. The visualized esophagus and thyroid gland appear unremarkable.  The chest wall appears unremarkable. There is degenerative changes of the spine. Lower cervical posterior fixation hardware partially visualized. No acute fracture.  Review of the MIP images confirms the above findings.  CT ABDOMEN AND PELVIS FINDINGS  No intra-abdominal free air or free fluid.  Gallstone. No pericholecystic fluid. The liver, pancreas, spleen, adrenal glands, kidneys, visualized ureters, and urinary bladder appear unremarkable. Stable appearing subcentimeter right renal inferior pole hypodense lesion is not well characterized but likely represents a cyst. Stable appearing left sidewall lipoma with mild mass effect and compression of the left side of the bladder wall. The prostate and seminal vesicles are grossly unremarkable.  There is no evidence of bowel obstruction or inflammation. Normal appendix.  There is  aortoiliac atherosclerotic disease. The abdominal aorta and IVC appear patent. No portal venous gas identified. There is no lymphadenopathy. Small fat containing left inguinal hernia. Degenerative changes of the spine. Left femoral head avascular necrosis. No acute fracture.  Review of the MIP images confirms the above findings.  IMPRESSION: No CT evidence of pulmonary embolism or aortic dissection.  Cholelithiasis.  No evidence of bowel obstruction or inflammation.  Normal appendix.   Electronically Signed   By: Anner Crete M.D.   On: 09/13/2014 01:09    PATIENT SUMMARY  59 yo male with DM2, minimal non-obstructive CAD on cath in 2004 admitted with chest pain and syncope.  CEs remained negative.  CTA neg for dissection or PE.  Lexiscan Myoview arranged to assess for ischemia.  ASSESSMENT AND PLAN  1.  Chest Pain:  Lexiscan Myoview today - Patient injected and observed for 5 minutes.  ECG without changes.  Symptoms included nausea, coughing, diaphoresis, chest pain.  Images are pending at this time. 2.  CAD:  Myoview done today.  Continue ASA, statin.   3.  Diabetes:  Management per IM. 4.  Syncope:  Lying and standing BP with no change this AM.  Consider arranging Event Monitor at DC.  Will likely need to restrict driving x 6 mos given unexplained syncope.    Signed, Richardson Dopp, PA-C  09/14/2014, 9:44 AM     Attending Note:   The patient was seen and examined.  Agree with assessment and plan as noted above.  Changes made to the above note as needed.  myoview is negtive. Agree with 30 day event monitor . He should follow up with his medical doctor  He may see [place] as needed   Thayer Headings, Brooke Bonito., MD, [facility] 09/14/2014, 2:30 PM [address],  [place] Pager [phone]

## 2014-09-14 NOTE — Discharge Summary (Addendum)
Physician Discharge Summary  Stephen Herrera PPI:[id number] DOB: 08-03-1955 DOA: 09/12/2014  PCP: Cathlean Cower, MD  Admit date: 09/12/2014 Discharge date: 09/14/2014  Time spent: 35 minutes  Recommendations for Outpatient Follow-up:  1. Patient undergoing cardiac workup for CP and SOB, had a negative stress test. CTA negative for PE  2. Patient having significant anxiety, discharged on Xanax 3. Please follow up on CBC on hospital follow up visit, patient having suspected reactive erythrocytosis  Discharge Diagnoses:  Principal Problem:   Chest pain Active Problems:   Chronic fatigue syndrome   Hypogonadism male   Hypothyroidism   DM (diabetes mellitus)   OSA (obstructive sleep apnea)   Anxiety   Erythrocytosis   Syncope   Discharge Condition: Stable  Diet recommendation: Heart Healthy  Filed Weights   09/13/14 0249  Weight: 92.579 kg (204 lb 1.6 oz)    History of present illness:  Stephen Herrera is a 59 y.o. male with Past medical history of anxiety, diabetes mellitus, hypogonadism, chronic fatigue syndrome, GERD, nonobstructive coronary artery disease. The patient is presenting with complaints of chest pain as well as shortness of breath as well as a possible episode of syncope. Today while the patient was at home he felt significantly warm and went for a cold shower. After the shower while he was walking he suddenly fell on the ground. The wife heard a noise and found that the patient was on the ground but was awake although was not oriented. The patient does not remember the event. After that he had significant shortness of breath as well as chest discomfort which is located centrally and felt like tightness and somebody was sitting on his chest. Therefore they brought him to the hospital. Off note the patient has been receiving testosterone injections every 2 weeks. Last injection was last week. He also has recurrent complaints of diaphoresis, shortness of breath, chest  tightness and has been seen by pulmonologist, endocrinologist, his PCP.  Hospital Course:  Patient is a pleasant 59 year old gentleman with a past medical history of diabetes mellitus, history of nonobstructive coronary disease, generalized exam started, admitted to medicine service on 09/13/2014 when he presented with complaints of chest pain located in epigastric region characterized as tight, pressure-like that was associated with shortness of breath. Initial workup included a chest x-ray that did not reveal acute cardiopulmonary disease. EKG did not show acute ischemic changes. Troponins were within normal limits. Cardiology consulted.  He underwent a stress test on 09/14/2014 which did not reveal reversible ischemia.  Patient reports excess of diaphoresis, shortness of breath, and anxiety.  He expresses his frustrations over this.  During this hospitalization he underwent a CT scan of chest abdomen and pelvis.  His liver, pancreas, spleen, adrenal glands, kidneys, ureters, urinary bladder appeared unremarkable.  Radiology reported stable appearance of right renal inferior pole hypodense lesion likely representing a cyst.  There was no evidence of pulmonary embolism.  Labs revealed hemoglobin of 19.2 and 18.8.  This is an increased from 14.4 on 04/17/2012.  I discussed these findings with Dr.Shadad of hematology who felt this likely represented reactive erythrocytosis. His white count and platelets were within normal limits. CT scan of chest abdomen and pelvis did not reveal evidence of underlying malignancy. There was no splenomegaly. He felt this could possibility be due to an underlying pulmonary condition like OSA or COPD and could be further worked up in the outpatient setting. He did not recommend further workup during this hospitalization. His TSH was within normal  limits at 1.48. Patient becoming angry during my encounter expression his frustrations. I explained that our work up during this  hospitalization has not shown acute pathology and could undergo further testing/workup with his PCP.    Procedures: Stress Test IMPRESSION: 1. No reversible ischemia or infarction.  2. Normal left ventricular wall motion.  3. Left ventricular ejection fraction 63%  4. Low-risk stress test findings*.  Consultations:  Cardiology  Telephone consultation made with Dr Alen Blew of Heme  Discharge Exam: Filed Vitals:   09/14/14 0946  BP:   Pulse: 97  Temp:   Resp:            General: Appears angry and frustrated  Cardiovascular: Regular rate and rhythm normal S1-S2 no murmurs rubs or gallops, no extremity edema  Respiratory: Normal respiratory effort, lungs are clear to auscultation bilaterally  Abdomen: Soft nontender nondistended positive bowel sounds  Musculoskeletal: No edema Discharge Instructions   Discharge Instructions    Call MD for:  difficulty breathing, headache or visual disturbances    Complete by:  As directed      Call MD for:  extreme fatigue    Complete by:  As directed      Call MD for:  hives    Complete by:  As directed      Call MD for:  persistant dizziness or light-headedness    Complete by:  As directed      Call MD for:  persistant nausea and vomiting    Complete by:  As directed      Call MD for:  redness, tenderness, or signs of infection (pain, swelling, redness, odor or green/yellow discharge around incision site)    Complete by:  As directed      Call MD for:  severe uncontrolled pain    Complete by:  As directed      Call MD for:  temperature >100.4    Complete by:  As directed      Call MD for:    Complete by:  As directed      Diet - low sodium heart healthy    Complete by:  As directed      Increase activity slowly    Complete by:  As directed           Current Discharge Medication List    START taking these medications   Details  ALPRAZolam (XANAX) 0.25 MG tablet Take 1 tablet (0.25 mg total) by mouth 2 (two) times  daily as needed for anxiety. Qty: 15 tablet, Refills: 0      CONTINUE these medications which have NOT CHANGED   Details  aspirin 81 MG tablet Take 81 mg by mouth daily.    atorvastatin (LIPITOR) 40 MG tablet Take 40 mg by mouth at bedtime.    empagliflozin (JARDIANCE) 25 MG TABS tablet Take 25 mg by mouth daily.    HYDROcodone-acetaminophen (NORCO) 10-325 MG per tablet Take 1 tablet by mouth every 6 (six) hours as needed for moderate pain.    insulin NPH-regular Human (NOVOLIN 70/30) (70-30) 100 UNIT/ML injection Inject 100 Units into the skin 2 (two) times daily.    Probiotic Product (PROBIOTIC PO) Take 1 tablet by mouth every morning.    testosterone cypionate (DEPOTESTOTERONE CYPIONATE) 200 MG/ML injection Inject 0.8 mg into the muscle every 14 (fourteen) days.       No Known Allergies Follow-up Information    Follow up with Cathlean Cower, MD In 1 week.   Specialties:  Internal  Medicine, Radiology   Contact information:   [address] [place] [id number] [phone]        The results of significant diagnostics from this hospitalization (including imaging, microbiology, ancillary and laboratory) are listed below for reference.    Significant Diagnostic Studies: Dg Chest 2 View  09/12/2014   CLINICAL DATA:  Two week history of severe mid chest pain and shortness of breath. Current history of COPD and coronary artery disease.  EXAM: CHEST  2 VIEW  COMPARISON:  04/30/2014 and earlier.  FINDINGS: Cardiomediastinal silhouette unremarkable, unchanged. Nodular opacities projected over the lung bases on the PA image correspond to the nipples on the lateral image. Lungs clear. Bronchovascular markings normal. Pulmonary vascularity normal. No visible pleural effusions. No pneumothorax. Degenerative changes and DISH involving the thoracic spine.  IMPRESSION: No acute cardiopulmonary disease. Nodular opacities projected over the lung bases on the PA image correspond to the  nipples on the lateral image.   Electronically Signed   By: Evangeline Dakin M.D.   On: 09/12/2014 20:55   Ct Angio Chest Pe W/cm &/or Wo Cm  09/13/2014   CLINICAL DATA:  59 year old male with chest pain and shortness of breath. Unexpected weight loss.  EXAM: CT ANGIOGRAPHY CHEST  CT ABDOMEN AND PELVIS with contrast  TECHNIQUE: Multidetector CT imaging through the chest, abdomen and pelvis was performed using the standard protocol during bolus administration of intravenous contrast. Multiplanar reconstructed images and MIPs were obtained and reviewed to evaluate the vascular anatomy.  CONTRAST:  170mL OMNIPAQUE IOHEXOL 350 MG/ML SOLN  COMPARISON:  CT dated 04/19/2012  FINDINGS: CTA CHEST FINDINGS  The lungs are clear. No focal consolidation, pleural effusion, or pneumothorax. The central airways are patent.  The visualized thoracic aorta appears unremarkable. No CT evidence of pulmonary embolism. No hilar or mediastinal adenopathy. There is no cardiomegaly or pericardial effusion. There is coronary vascular calcification. The visualized esophagus and thyroid gland appear unremarkable.  The chest wall appears unremarkable. There is degenerative changes of the spine. Lower cervical posterior fixation hardware partially visualized. No acute fracture.  Review of the MIP images confirms the above findings.  CT ABDOMEN AND PELVIS FINDINGS  No intra-abdominal free air or free fluid.  Gallstone. No pericholecystic fluid. The liver, pancreas, spleen, adrenal glands, kidneys, visualized ureters, and urinary bladder appear unremarkable. Stable appearing subcentimeter right renal inferior pole hypodense lesion is not well characterized but likely represents a cyst. Stable appearing left sidewall lipoma with mild mass effect and compression of the left side of the bladder wall. The prostate and seminal vesicles are grossly unremarkable.  There is no evidence of bowel obstruction or inflammation. Normal appendix.  There is  aortoiliac atherosclerotic disease. The abdominal aorta and IVC appear patent. No portal venous gas identified. There is no lymphadenopathy. Small fat containing left inguinal hernia. Degenerative changes of the spine. Left femoral head avascular necrosis. No acute fracture.  Review of the MIP images confirms the above findings.  IMPRESSION: No CT evidence of pulmonary embolism or aortic dissection.  Cholelithiasis.  No evidence of bowel obstruction or inflammation.  Normal appendix.   Electronically Signed   By: Anner Crete M.D.   On: 09/13/2014 01:09   Ct Abdomen Pelvis W Contrast  09/13/2014   CLINICAL DATA:  59 year old male with chest pain and shortness of breath. Unexpected weight loss.  EXAM: CT ANGIOGRAPHY CHEST  CT ABDOMEN AND PELVIS with contrast  TECHNIQUE: Multidetector CT imaging through the chest, abdomen and pelvis was  performed using the standard protocol during bolus administration of intravenous contrast. Multiplanar reconstructed images and MIPs were obtained and reviewed to evaluate the vascular anatomy.  CONTRAST:  160mL OMNIPAQUE IOHEXOL 350 MG/ML SOLN  COMPARISON:  CT dated 04/19/2012  FINDINGS: CTA CHEST FINDINGS  The lungs are clear. No focal consolidation, pleural effusion, or pneumothorax. The central airways are patent.  The visualized thoracic aorta appears unremarkable. No CT evidence of pulmonary embolism. No hilar or mediastinal adenopathy. There is no cardiomegaly or pericardial effusion. There is coronary vascular calcification. The visualized esophagus and thyroid gland appear unremarkable.  The chest wall appears unremarkable. There is degenerative changes of the spine. Lower cervical posterior fixation hardware partially visualized. No acute fracture.  Review of the MIP images confirms the above findings.  CT ABDOMEN AND PELVIS FINDINGS  No intra-abdominal free air or free fluid.  Gallstone. No pericholecystic fluid. The liver, pancreas, spleen, adrenal glands, kidneys,  visualized ureters, and urinary bladder appear unremarkable. Stable appearing subcentimeter right renal inferior pole hypodense lesion is not well characterized but likely represents a cyst. Stable appearing left sidewall lipoma with mild mass effect and compression of the left side of the bladder wall. The prostate and seminal vesicles are grossly unremarkable.  There is no evidence of bowel obstruction or inflammation. Normal appendix.  There is aortoiliac atherosclerotic disease. The abdominal aorta and IVC appear patent. No portal venous gas identified. There is no lymphadenopathy. Small fat containing left inguinal hernia. Degenerative changes of the spine. Left femoral head avascular necrosis. No acute fracture.  Review of the MIP images confirms the above findings.  IMPRESSION: No CT evidence of pulmonary embolism or aortic dissection.  Cholelithiasis.  No evidence of bowel obstruction or inflammation.  Normal appendix.   Electronically Signed   By: Anner Crete M.D.   On: 09/13/2014 01:09   Nm Myocar Multi W/spect W/wall Motion / Ef  09/14/2014   CLINICAL DATA:  59 year old with current history of diabetes, hyperlipidemia, COPD and family history of coronary artery disease, presenting with a 2 week history of mid chest pain and shortness of breath.  EXAM: MYOCARDIAL IMAGING WITH SPECT (REST AND PHARMACOLOGIC-STRESS)  GATED LEFT VENTRICULAR WALL MOTION STUDY  LEFT VENTRICULAR EJECTION FRACTION  TECHNIQUE: Standard myocardial SPECT imaging was performed after resting intravenous injection of 10 mCi Tc-43m sestamibi. Subsequently, intravenous infusion of Lexiscan was performed under the supervision of the Cardiology staff. At peak effect of the drug, 30 mCi Tc-11m sestamibi was injected intravenously and standard myocardial SPECT imaging was performed. Quantitative gated imaging was also performed to evaluate left ventricular wall motion, and estimate left ventricular ejection fraction.  COMPARISON:   None.  FINDINGS: Perfusion: Slight diminished uptake in the inferior wall on the immediate post regadenoson images can be explained on the basis of diaphragmatic attenuation. Initial resting images similar findings. No evidence of reversibility to suggest ischemia.  Wall Motion: Normal left ventricular wall motion. No left ventricular dilation.  Left Ventricular Ejection Fraction: 48%  End diastolic volume 75 ml  End systolic volume 27 ml  IMPRESSION: 1. No reversible ischemia or infarction.  2. Normal left ventricular wall motion.  3. Left ventricular ejection fraction 63%  4. Low-risk stress test findings*.  *2012 Appropriate Use Criteria for Coronary Revascularization Focused Update: J Am Coll Cardiol. 5462;70(3):500-938. http://content.airportbarriers.com.aspx?articleid=1201161   Electronically Signed   By: Evangeline Dakin M.D.   On: 09/14/2014 12:52    Microbiology: No results found for this or any previous visit (from the past 240  hour(s)).   Labs: Basic Metabolic Panel:  Recent Labs Lab 09/12/14 2013 09/13/14 0620  NA 138 136  K 3.7 3.5  CL 102 102  CO2 20* 21*  GLUCOSE 144* 126*  BUN 18 17  CREATININE 1.15 1.05  CALCIUM 9.4 9.1   Liver Function Tests:  Recent Labs Lab 09/13/14 0620  AST 25  ALT 25  ALKPHOS 75  BILITOT 1.6*  PROT 7.9  ALBUMIN 4.2   No results for input(s): LIPASE, AMYLASE in the last 168 hours. No results for input(s): AMMONIA in the last 168 hours. CBC:  Recent Labs Lab 09/12/14 2013 09/13/14 0620  WBC 10.1 9.8  NEUTROABS  --  7.0  HGB 19.2* 18.8*  HCT 54.9* 54.8*  MCV 88.0 88.7  PLT 259 215   Cardiac Enzymes:  Recent Labs Lab 09/13/14 0620 09/13/14 1140 09/13/14 1620  TROPONINI <0.03 <0.03 <0.03   BNP: BNP (last 3 results)  Recent Labs  09/13/14 0620  BNP 4.6    ProBNP (last 3 results) No results for input(s): PROBNP in the last 8760 hours.  CBG:  Recent Labs Lab 09/13/14 2302 09/14/14 0052 09/14/14 0431  09/14/14 0556 09/14/14 1115  GLUCAP 168* 105* 85 93 188*       Signed:  Thaison Kolodziejski  [facility] 09/14/2014, 2:20 PM

## 2014-09-15 LAB — HEMOGLOBIN A1C
Hgb A1c MFr Bld: 6.1 % — ABNORMAL HIGH (ref 4.8–5.6)
Mean Plasma Glucose: 128 mg/dL

## 2014-09-16 ENCOUNTER — Ambulatory Visit (INDEPENDENT_AMBULATORY_CARE_PROVIDER_SITE_OTHER): Payer: Medicare HMO | Admitting: Internal Medicine

## 2014-09-16 ENCOUNTER — Encounter: Payer: Self-pay | Admitting: Physician Assistant

## 2014-09-16 ENCOUNTER — Encounter: Payer: Self-pay | Admitting: Internal Medicine

## 2014-09-16 VITALS — BP 120/70 | HR 99 | Temp 98.3°F | Wt 206.0 lb

## 2014-09-16 DIAGNOSIS — F419 Anxiety disorder, unspecified: Secondary | ICD-10-CM | POA: Diagnosis not present

## 2014-09-16 DIAGNOSIS — D751 Secondary polycythemia: Secondary | ICD-10-CM | POA: Diagnosis not present

## 2014-09-16 DIAGNOSIS — R1013 Epigastric pain: Secondary | ICD-10-CM

## 2014-09-16 DIAGNOSIS — R109 Unspecified abdominal pain: Secondary | ICD-10-CM | POA: Insufficient documentation

## 2014-09-16 MED ORDER — CLONAZEPAM 0.5 MG PO TABS: 0.5000 mg | ORAL_TABLET | Freq: Two times a day (BID) | ORAL | Status: DC | PRN

## 2014-09-16 MED ORDER — PANTOPRAZOLE SODIUM 40 MG PO TBEC: 40.0000 mg | DELAYED_RELEASE_TABLET | Freq: Every day | ORAL | Status: DC

## 2014-09-16 MED ORDER — ESCITALOPRAM OXALATE 10 MG PO TABS: 10.0000 mg | ORAL_TABLET | Freq: Every day | ORAL | Status: DC

## 2014-09-16 NOTE — Assessment & Plan Note (Signed)
Unclear signficance, but will plan to repeat with PSA in 2 mo (already to have psa at 6 mo for ? Mild increased velocity last visit)

## 2014-09-16 NOTE — Patient Instructions (Addendum)
Please take all new medication as prescribed   - the klonopin as needed, the lexapro 10 mg every day (takes up to 3 -4 wks for the full effect), and protonix  Please continue all other medications as before, and refills have been done if requested.  Please have the pharmacy call with any other refills you may need.  Please keep your appointments with your specialists as you may have planned  You will be contacted regarding the referral for: Gastroenterology  Please go to the LAB in the Basement (turn left off the elevator) for the tests to be done in 2 mo with a repeat blood count as well as the PSA will be ordered

## 2014-09-16 NOTE — Progress Notes (Signed)
Pre visit review using our clinic review tool, if applicable. No additional management support is needed unless otherwise documented below in the visit note. 

## 2014-09-16 NOTE — Assessment & Plan Note (Signed)
Etiology unclear, for empiric PPI, refer GI, may need EGD

## 2014-09-16 NOTE — Assessment & Plan Note (Signed)
With panic, likely a primary issue here, for lexapro 10 qd, and klonopin bid prn, d/c xanax, declines counseling, appears to have some but still limited insight
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
## 2014-09-17 ENCOUNTER — Ambulatory Visit (INDEPENDENT_AMBULATORY_CARE_PROVIDER_SITE_OTHER): Payer: Medicare HMO

## 2014-09-17 DIAGNOSIS — R55 Syncope and collapse: Secondary | ICD-10-CM

## 2014-09-19 ENCOUNTER — Ambulatory Visit: Payer: Medicare HMO | Admitting: Internal Medicine

## 2014-09-25 ENCOUNTER — Telehealth: Payer: Self-pay

## 2014-09-25 NOTE — Telephone Encounter (Signed)
Call to patient regarding scheduling for  AWV; The patient stated he was very frustrated regarding not being dx with etiology of weight loss and other issues.  Apt with cardiology in process but has not rec'd apt for GI. Noted to the patient he does have an apt with Dr. Raquel James on Oct  5th at 8:30; the patient stated he had not been notified of that, but October was not going to work as he needs to get in sooner.  Will route to Dr. Jenny Reichmann as Juluis Rainier or for recommendation if any; call placed to GI/ Dr. Raquel James office and did place Mr Gazda on a waiting list  If there is a cancellation prior to his schedule apt. Offered to assist if needed; reviewed referral to [place] made earlier this year and the patient declined the referral.

## 2014-10-20 ENCOUNTER — Encounter: Payer: Self-pay | Admitting: *Deleted

## 2014-10-28 ENCOUNTER — Encounter: Payer: Medicare HMO | Admitting: Physician Assistant
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
## 2014-10-29 ENCOUNTER — Encounter: Payer: Self-pay | Admitting: Physician Assistant

## 2014-10-29 ENCOUNTER — Ambulatory Visit (INDEPENDENT_AMBULATORY_CARE_PROVIDER_SITE_OTHER): Payer: Medicare HMO | Admitting: Physician Assistant

## 2014-10-29 VITALS — BP 120/70 | HR 97 | Ht 67.5 in | Wt 193.0 lb

## 2014-10-29 DIAGNOSIS — R0789 Other chest pain: Secondary | ICD-10-CM

## 2014-10-29 DIAGNOSIS — I2510 Atherosclerotic heart disease of native coronary artery without angina pectoris: Secondary | ICD-10-CM

## 2014-10-29 DIAGNOSIS — E785 Hyperlipidemia, unspecified: Secondary | ICD-10-CM

## 2014-10-29 DIAGNOSIS — R55 Syncope and collapse: Secondary | ICD-10-CM | POA: Diagnosis not present

## 2014-10-29 NOTE — Patient Instructions (Addendum)
Medication Instructions:  Your physician recommends that you continue on your current medications as directed. Please refer to the Current Medication list given to you today.   Labwork: NONE  Testing/Procedures: NONE  Follow-Up: 1 YEAR WITH DR. Acie Fredrickson; WE WILL SEND OUT A REMINDER LETTER A COUPLE OF MONTHS IN [facility] TO MAKE AN APPT  Any Other Special Instructions Will Be Listed Below (If Applicable).

## 2014-11-14 ENCOUNTER — Emergency Department (HOSPITAL_COMMUNITY)
Admission: EM | Admit: 2014-11-14 | Discharge: 2014-11-14 | Disposition: A | Discharge status: Home / Self Care | Payer: Medicare HMO | Attending: Emergency Medicine | Admitting: Emergency Medicine

## 2014-11-14 ENCOUNTER — Encounter (HOSPITAL_COMMUNITY): Payer: Self-pay | Admitting: *Deleted

## 2014-11-14 DIAGNOSIS — K219 Gastro-esophageal reflux disease without esophagitis: Secondary | ICD-10-CM | POA: Diagnosis not present

## 2014-11-14 DIAGNOSIS — F329 Major depressive disorder, single episode, unspecified: Secondary | ICD-10-CM | POA: Diagnosis not present

## 2014-11-14 DIAGNOSIS — Z7982 Long term (current) use of aspirin: Secondary | ICD-10-CM | POA: Insufficient documentation

## 2014-11-14 DIAGNOSIS — Y9289 Other specified places as the place of occurrence of the external cause: Secondary | ICD-10-CM | POA: Insufficient documentation

## 2014-11-14 DIAGNOSIS — Y998 Other external cause status: Secondary | ICD-10-CM | POA: Insufficient documentation

## 2014-11-14 DIAGNOSIS — Z86010 Personal history of colonic polyps: Secondary | ICD-10-CM | POA: Insufficient documentation

## 2014-11-14 DIAGNOSIS — J449 Chronic obstructive pulmonary disease, unspecified: Secondary | ICD-10-CM | POA: Insufficient documentation

## 2014-11-14 DIAGNOSIS — K589 Irritable bowel syndrome without diarrhea: Secondary | ICD-10-CM | POA: Insufficient documentation

## 2014-11-14 DIAGNOSIS — Z79899 Other long term (current) drug therapy: Secondary | ICD-10-CM | POA: Diagnosis not present

## 2014-11-14 DIAGNOSIS — S1096XA Insect bite of unspecified part of neck, initial encounter: Secondary | ICD-10-CM | POA: Diagnosis not present

## 2014-11-14 DIAGNOSIS — Y9389 Activity, other specified: Secondary | ICD-10-CM | POA: Insufficient documentation

## 2014-11-14 DIAGNOSIS — E785 Hyperlipidemia, unspecified: Secondary | ICD-10-CM | POA: Insufficient documentation

## 2014-11-14 DIAGNOSIS — Z794 Long term (current) use of insulin: Secondary | ICD-10-CM | POA: Diagnosis not present

## 2014-11-14 DIAGNOSIS — I2510 Atherosclerotic heart disease of native coronary artery without angina pectoris: Secondary | ICD-10-CM | POA: Diagnosis not present

## 2014-11-14 DIAGNOSIS — E119 Type 2 diabetes mellitus without complications: Secondary | ICD-10-CM | POA: Diagnosis not present

## 2014-11-14 DIAGNOSIS — W57XXXA Bitten or stung by nonvenomous insect and other nonvenomous arthropods, initial encounter: Secondary | ICD-10-CM | POA: Diagnosis not present

## 2014-11-14 DIAGNOSIS — Z87891 Personal history of nicotine dependence: Secondary | ICD-10-CM | POA: Insufficient documentation

## 2014-11-14 DIAGNOSIS — G8929 Other chronic pain: Secondary | ICD-10-CM | POA: Insufficient documentation

## 2014-11-14 DIAGNOSIS — Z8739 Personal history of other diseases of the musculoskeletal system and connective tissue: Secondary | ICD-10-CM | POA: Diagnosis not present

## 2014-11-14 MED ORDER — PREDNISONE 20 MG PO TABS: ORAL_TABLET | ORAL | Status: DC

## 2014-11-14 MED ORDER — FAMOTIDINE 20 MG PO TABS: 20.0000 mg | ORAL_TABLET | Freq: Two times a day (BID) | ORAL | Status: DC

## 2014-11-14 MED ORDER — LORATADINE 10 MG PO TABS
10.0000 mg | ORAL_TABLET | Freq: Once | ORAL | Status: AC
  Administered 2014-11-14: 10 mg via ORAL
  Filled 2014-11-14: qty 1

## 2014-11-14 MED ORDER — FAMOTIDINE 20 MG PO TABS
20.0000 mg | ORAL_TABLET | Freq: Once | ORAL | Status: AC
Start: 2014-11-14 — End: 2014-11-14
  Administered 2014-11-14: 20 mg via ORAL
  Filled 2014-11-14: qty 1

## 2014-11-14 MED ORDER — PREDNISONE 20 MG PO TABS
60.0000 mg | ORAL_TABLET | Freq: Once | ORAL | Status: AC
  Administered 2014-11-14: 60 mg via ORAL
  Filled 2014-11-14: qty 3

## 2014-11-14 NOTE — ED Notes (Addendum)
Pt to ED from home c/o swelling to posterior neck. Reports a possible insect back yesterday; has been taking benadryl without relief. Denies drainage; swelling noted
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
## 2014-11-26 ENCOUNTER — Encounter: Payer: Self-pay | Admitting: Internal Medicine

## 2014-11-26 ENCOUNTER — Other Ambulatory Visit (INDEPENDENT_AMBULATORY_CARE_PROVIDER_SITE_OTHER): Payer: Medicare HMO

## 2014-11-26 ENCOUNTER — Ambulatory Visit (INDEPENDENT_AMBULATORY_CARE_PROVIDER_SITE_OTHER): Payer: Medicare HMO | Admitting: Internal Medicine

## 2014-11-26 ENCOUNTER — Telehealth: Payer: Self-pay | Admitting: *Deleted

## 2014-11-26 VITALS — BP 110/60 | HR 76 | Ht 67.5 in | Wt 195.2 lb

## 2014-11-26 DIAGNOSIS — E236 Other disorders of pituitary gland: Secondary | ICD-10-CM | POA: Diagnosis not present

## 2014-11-26 DIAGNOSIS — K6389 Other specified diseases of intestine: Secondary | ICD-10-CM

## 2014-11-26 DIAGNOSIS — R634 Abnormal weight loss: Secondary | ICD-10-CM

## 2014-11-26 DIAGNOSIS — R194 Change in bowel habit: Secondary | ICD-10-CM

## 2014-11-26 DIAGNOSIS — D582 Other hemoglobinopathies: Secondary | ICD-10-CM | POA: Diagnosis not present

## 2014-11-26 DIAGNOSIS — R198 Other specified symptoms and signs involving the digestive system and abdomen: Secondary | ICD-10-CM | POA: Diagnosis not present

## 2014-11-26 LAB — COMPREHENSIVE METABOLIC PANEL
ALK PHOS: 78 U/L (ref 39–117)
ALT: 31 U/L (ref 0–53)
AST: 27 U/L (ref 0–37)
Albumin: 4.5 g/dL (ref 3.5–5.2)
BUN: 24 mg/dL — AB (ref 6–23)
CHLORIDE: 100 meq/L (ref 96–112)
CO2: 27 meq/L (ref 19–32)
Calcium: 9.8 mg/dL (ref 8.4–10.5)
Creatinine, Ser: 0.95 mg/dL (ref 0.40–1.50)
GFR: 85.74 mL/min (ref >60.00)
GLUCOSE: 158 mg/dL — AB (ref 70–99)
POTASSIUM: 4 meq/L (ref 3.5–5.1)
SODIUM: 136 meq/L (ref 135–145)
TOTAL PROTEIN: 8 g/dL (ref 6.0–8.3)
Total Bilirubin: 0.9 mg/dL (ref 0.2–1.2)

## 2014-11-26 LAB — IGA: IgA: 268 mg/dL (ref 68–378)

## 2014-11-26 MED ORDER — METRONIDAZOLE 500 MG PO TABS: 500.0000 mg | ORAL_TABLET | Freq: Three times a day (TID) | ORAL | Status: DC

## 2014-11-26 NOTE — Telephone Encounter (Signed)
-----   Message from Larina Bras, [place] sent at 11/26/2014 10:55 AM EDT ----- ===View-only below this line===  ----- Message -----    From: Jerene Bears, MD    Sent: 11/26/2014  10:39 AM      To: Larina Bras, CMA  I would recommend to the pt he be seen by hematology to eval elevated Hgb and rule out polycythemia vera which is a blood condition that can cause elevated Hgb

## 2014-11-26 NOTE — Patient Instructions (Signed)
Your physician has requested that you go to the basement for the following lab work before leaving today: CMP, IgA, tissue transglutamase IgA, 24 hour HIAA, chromogranin A  We have sent the following medications to your pharmacy for you to pick up at your convenience: Flagyl 500 mg three times daily x 7 days. (please hold probiotics while on flagyl. You may restart after finishing the flagyl)  Please follow up with Dr Hilarie Fredrickson on 01/27/15 @ 8:45 am.

## 2014-11-26 NOTE — Addendum Note (Signed)
Addended by: Larina Bras on: 11/26/2014 10:53 AM   Modules accepted: Orders

## 2014-11-26 NOTE — Progress Notes (Signed)
Subjective:    Patient ID: Stephen Herrera, male    DOB: January 13, 1956, 59 y.o.   MRN: [id number]  HPI Eydan Chianese is a 59 year old male with past medical history of GERD, IBS, adenomatous colon polyps, bacterial overgrowth, who is seen in follow-up for further investigation of considerable weight loss and change in bowel habits. He was last seen in March 2014. Since being seen here he was seen at [facility] in the GI division and had workup there including gastric emptying scan which was normal and hydrogen breath testing which revealed increased bacteria reportedly "not an overgrowth but forgut colonization". He was treated with metronidazole followed by probiotic.  When he was last seen here workup including upper endoscopy and colonoscopy, stool testing and blood testing was largely unremarkable. He was on cholestyramine which had dramatically improved his diarrhea. This essentially been stopped and he reports after treatment with Flagyl he had 3-4 months of completely normal bowel movements no abdominal bloating and was feeling great. He has not been retreated with antibiotics but continues on probiotic.  He has lost about 60 pounds. He is having trouble with night sweats but not flushing. He was hospitalized in July with chest pain. Cardiac workup was largely unrevealing. He wore an event monitor for 1 month. He was started on Zoloft for stress and anxiety. He started on 25 mg was increased to 50 mg but had somewhat paradoxical reaction to this dose. Dose was decreased to 25 mg. He is now tolerating this well and feels that it has helped. He was having abdominal discomfort but this has resolved and he attributes this to Zoloft. His bloating has resolved though he does report frequent lower GI gas. Reports a very good appetite without nausea or vomiting. Denies early satiety. Continues with excessive sweating. Bowel movements are hard in the morning initially followed by soft and loose stools over a several  hour period in the morning. Occasionally he will have another loose stool at night depending on his diet. He has been on a probiotic from [facility] and was seeing the entire capsule secreted in his stool. He is thus breaking the capsule open each morning.  He has a family member who is a physician and they have raised the question of carcinoid tumor or parasite. He does travel to [place] twice per year to visit his son. Diabetes has improved dramatically with his significant weight loss.  He did have cross-sectional imaging performed in the hospital which included a CTA of the chest and CT abdomen and pelvis. This was in July 2016. This showed no evidence of PE. There was gallstones. There is no evidence of bowel obstruction or inflammation. Liver pancreas and spleen appeared normal. Did have a small cyst and a stable sidewall lipoma near the bladder.  EGD and colonoscopy from 2013 were reviewed today including with the patient. Nonspecific inflammation in the stomach without metaplasia, dysplasia or H. pylori. Peptic duodenitis without evidence of celiac disease on small bowel biopsies. Colonoscopy random biopsies were normal. Hyperplastic polyps removed.  Review of Systems As per history of present illness, otherwise negative  Current Medications, Allergies, Past Medical History, Past Surgical History, Family History and Social History were reviewed in [employer] record.     Objective:   Physical Exam BP 110/60 mmHg  Pulse 76  Ht 5' 7.5" (1.715 m)  Wt 195 lb 3.2 oz (88.542 kg)  BMI 30.10 kg/m2 Constitutional: Well-developed and well-nourished. No distress. HEENT: Normocephalic and atraumatic. Oropharynx  is clear and moist. No oropharyngeal exudate. Conjunctivae are normal.  No scleral icterus. Neck: Neck supple. Trachea midline. Cardiovascular: Normal rate, regular rhythm and intact distal pulses. No M/R/G Pulmonary/chest: Effort normal and breath sounds normal. No  wheezing, rales or rhonchi. Abdominal: Soft, nontender, nondistended. Bowel sounds active throughout. There are no masses palpable. No hepatosplenomegaly. Extremities: no clubbing, cyanosis, or edema Lymphadenopathy: No cervical adenopathy noted. Neurological: Alert and oriented to person place and time. Skin: Skin is warm and dry. No rashes noted. Psychiatric: Normal mood and affect. Behavior is normal.  CBC    Component Value Date/Time   WBC 9.8 09/13/2014 0620   RBC 6.18* 09/13/2014 0620   HGB 18.8* 09/13/2014 0620   HCT 54.8* 09/13/2014 0620   PLT 215 09/13/2014 0620   MCV 88.7 09/13/2014 0620   MCH 30.4 09/13/2014 0620   MCHC 34.3 09/13/2014 0620   RDW 13.5 09/13/2014 0620   LYMPHSABS 2.0 09/13/2014 0620   MONOABS 0.7 09/13/2014 0620   EOSABS 0.1 09/13/2014 0620   BASOSABS 0.0 09/13/2014 0620    CMP     Component Value Date/Time   NA 136 09/13/2014 0620   K 3.5 09/13/2014 0620   CL 102 09/13/2014 0620   CO2 21* 09/13/2014 0620   GLUCOSE 126* 09/13/2014 0620   BUN 17 09/13/2014 0620   CREATININE 1.05 09/13/2014 0620   CALCIUM 9.1 09/13/2014 0620   PROT 7.9 09/13/2014 0620   ALBUMIN 4.2 09/13/2014 0620   AST 25 09/13/2014 0620   ALT 25 09/13/2014 0620   ALKPHOS 75 09/13/2014 0620   BILITOT 1.6* 09/13/2014 0620   GFRNONAA >60 09/13/2014 0620   GFRAA >60 09/13/2014 0620    CT ANGIOGRAPHY CHEST   CT ABDOMEN AND PELVIS with contrast   TECHNIQUE: Multidetector CT imaging through the chest, abdomen and pelvis was performed using the standard protocol during bolus administration of intravenous contrast. Multiplanar reconstructed images and MIPs were obtained and reviewed to evaluate the vascular anatomy.   CONTRAST:  124mL OMNIPAQUE IOHEXOL 350 MG/ML SOLN   COMPARISON:  CT dated 04/19/2012   FINDINGS: CTA CHEST FINDINGS   The lungs are clear. No focal consolidation, pleural effusion, or pneumothorax. The central airways are patent.   The visualized  thoracic aorta appears unremarkable. No CT evidence of pulmonary embolism. No hilar or mediastinal adenopathy. There is no cardiomegaly or pericardial effusion. There is coronary vascular calcification. The visualized esophagus and thyroid gland appear unremarkable.   The chest wall appears unremarkable. There is degenerative changes of the spine. Lower cervical posterior fixation hardware partially visualized. No acute fracture.   Review of the MIP images confirms the above findings.   CT ABDOMEN AND PELVIS FINDINGS   No intra-abdominal free air or free fluid.   Gallstone. No pericholecystic fluid. The liver, pancreas, spleen, adrenal glands, kidneys, visualized ureters, and urinary bladder appear unremarkable. Stable appearing subcentimeter right renal inferior pole hypodense lesion is not well characterized but likely represents a cyst. Stable appearing left sidewall lipoma with mild mass effect and compression of the left side of the bladder wall. The prostate and seminal vesicles are grossly unremarkable.   There is no evidence of bowel obstruction or inflammation. Normal appendix.   There is aortoiliac atherosclerotic disease. The abdominal aorta and IVC appear patent. No portal venous gas identified. There is no lymphadenopathy. Small fat containing left inguinal hernia. Degenerative changes of the spine. Left femoral head avascular necrosis. No acute fracture.   Review of the MIP images confirms  the above findings.   IMPRESSION: No CT evidence of pulmonary embolism or aortic dissection.   Cholelithiasis.   No evidence of bowel obstruction or inflammation.  Normal appendix.     Electronically Signed   By: Anner Crete M.D.   On: 09/13/2014 01:09  Previous gastrin normal VIP normal 5-HIAA 24hr urine normal from 2012  GES March 2014 [facility] -- normal, stomach emptying slightly quicker than normal     Assessment & Plan:  59 year old male with past medical  history of GERD, IBS, adenomatous colon polyps, bacterial overgrowth, who is seen in follow-up for further investigation of considerable weight loss and change in bowel habits.  1. Weight loss/altered bowel function and habits -- he has been having GI issues for years with unremarkable workup previously. Impressive response to Flagyl last year argues strongly for a component of bacterial overgrowth. No evidence for mount nutrition and albumen has been normal. Weight loss, has been dramatic and this is concerning. With his excessive sweating, the question of carcinoid is raised. Will check a chromogranin A level and repeat urine 24-hour 5 HIAA. Repeat celiac panel. Given the still altered bowel movements will re-treat with Flagyl 500 mg 3 times a day 1 week, resume probiotic after antibiotics treatment. We briefly discussed capsule endoscopy to examine deep small bowel and we will reconsider this depending on urine, blood test results and response to antibiotics.  2. Polycythemia -- elevated hemoglobin, unclear cause. Would consider hematology evaluation  40 min spent with pt today discussing the above and reviewed records

## 2014-11-26 NOTE — Telephone Encounter (Signed)
I have referred patient to hematology and have advised him of such. He verbalizes understanding.

## 2014-11-27 DIAGNOSIS — R69 Illness, unspecified: Secondary | ICD-10-CM | POA: Diagnosis not present

## 2014-11-27 LAB — TISSUE TRANSGLUTAMINASE, IGA: TISSUE TRANSGLUTAMINASE AB, IGA: 1 U/mL (ref <4)

## 2014-12-01 ENCOUNTER — Telehealth: Payer: Self-pay | Admitting: *Deleted

## 2014-12-01 LAB — CHROMOGRANIN A: CHROMOGRANIN A: 15 ng/mL (ref <=15)

## 2014-12-01 NOTE — Telephone Encounter (Signed)
I have contacted [facility] who states that order was cancelled because no urine was sent for testing. I have called for Lynette in the lab who released the order. She was with a patient so she will call us once patient is gone to discuss the HIAA.

## 2014-12-01 NOTE — Telephone Encounter (Signed)
-----   Message from Larina Bras, [place] sent at 12/01/2014  8:24 AM EDT ----- ===View-only below this line===  ----- Message -----    From: Jerene Bears, MD    Sent: 11/30/2014  11:02 PM      To: Larina Bras, CMA Subject: FW: Cancellation of Order # [id number]          Please see why the 24 hour urine test was canceled Thanks JMP  ----- Message -----    From: Lab in [place]: 11/28/2014   9:34 AM      To: Jerene Bears, MD Subject: Cancellation of Order # [id number]              Order number [id number] for the procedure 5 HIAA, QUANTITATIVE,  URINE, 24 HOUR [[id number]] has been canceled by Lab in [place]. This procedure was ordered by Jerene Bears, MD [[id number]] on Nov 26, 2014 for the patient Stephen Herrera [[id number]]. The reason for cancellation was "None".

## 2014-12-01 NOTE — Telephone Encounter (Signed)
Per Willette Cluster, patient will be bringing urine back for HIAA. Willette Cluster will put orders back in [facility] for this.

## 2014-12-03 ENCOUNTER — Telehealth: Payer: Self-pay | Admitting: *Deleted

## 2014-12-03 NOTE — Telephone Encounter (Signed)
Patient has been scheduled for an appointment with Dr Irene Limbo on Tuesday, 12/23/14 @ 10:30 am (spoke with Tiffany to schedule). I have left a voicemail for patient to call back.

## 2014-12-04 NOTE — Telephone Encounter (Signed)
Patient has been advised of appointment with Dr Irene Limbo on 12/23/14. Patient verbalizes understanding of time, date and location of appointment. He will call back with any questions.

## 2014-12-10 DIAGNOSIS — E236 Other disorders of pituitary gland: Secondary | ICD-10-CM | POA: Diagnosis not present

## 2014-12-11 DIAGNOSIS — R69 Illness, unspecified: Secondary | ICD-10-CM | POA: Diagnosis not present

## 2014-12-23 ENCOUNTER — Encounter: Payer: Self-pay | Admitting: Hematology

## 2014-12-23 ENCOUNTER — Other Ambulatory Visit (HOSPITAL_BASED_OUTPATIENT_CLINIC_OR_DEPARTMENT_OTHER): Payer: Medicare HMO

## 2014-12-23 ENCOUNTER — Ambulatory Visit (HOSPITAL_BASED_OUTPATIENT_CLINIC_OR_DEPARTMENT_OTHER): Payer: Medicare HMO | Admitting: Hematology

## 2014-12-23 ENCOUNTER — Telehealth: Payer: Self-pay | Admitting: Hematology

## 2014-12-23 VITALS — BP 117/63 | HR 66 | Temp 98.7°F | Resp 18 | Ht 67.5 in | Wt 192.7 lb

## 2014-12-23 DIAGNOSIS — R61 Generalized hyperhidrosis: Secondary | ICD-10-CM

## 2014-12-23 DIAGNOSIS — D751 Secondary polycythemia: Secondary | ICD-10-CM

## 2014-12-23 DIAGNOSIS — R509 Fever, unspecified: Secondary | ICD-10-CM | POA: Diagnosis not present

## 2014-12-23 DIAGNOSIS — D45 Polycythemia vera: Secondary | ICD-10-CM | POA: Diagnosis not present

## 2014-12-23 LAB — CBC & DIFF AND RETIC
BASO%: 0.4 % (ref 0.0–2.0)
Basophils Absolute: 0 10*3/uL (ref 0.0–0.1)
EOS ABS: 0.1 10*3/uL (ref 0.0–0.5)
EOS%: 0.8 % (ref 0.0–7.0)
HCT: 53.3 % — ABNORMAL HIGH (ref 38.4–49.9)
HEMOGLOBIN: 18.4 g/dL — AB (ref 13.0–17.1)
IMMATURE RETIC FRACT: 3.3 % (ref 3.00–10.60)
LYMPH%: 19.8 % (ref 14.0–49.0)
MCH: 31.1 pg (ref 27.2–33.4)
MCHC: 34.5 g/dL (ref 32.0–36.0)
MCV: 90 fL (ref 79.3–98.0)
MONO#: 0.6 10*3/uL (ref 0.1–0.9)
MONO%: 6.7 % (ref 0.0–14.0)
NEUT%: 72.3 % (ref 39.0–75.0)
NEUTROS ABS: 6.7 10*3/uL — AB (ref 1.5–6.5)
Platelets: 258 10*3/uL (ref 140–400)
RBC: 5.92 10*6/uL — ABNORMAL HIGH (ref 4.20–5.82)
RDW: 14.2 % (ref 11.0–14.6)
RETIC %: 1.15 % (ref 0.80–1.80)
Retic Ct Abs: 68.08 10*3/uL (ref 34.80–93.90)
WBC: 9.3 10*3/uL (ref 4.0–10.3)
lymph#: 1.8 10*3/uL (ref 0.9–3.3)

## 2014-12-23 LAB — COMPREHENSIVE METABOLIC PANEL (CC13)
ALT: 24 U/L (ref 0–55)
AST: 18 U/L (ref 5–34)
Albumin: 4.5 g/dL (ref 3.5–5.0)
Alkaline Phosphatase: 83 U/L (ref 40–150)
Anion Gap: 11 mEq/L (ref 3–11)
BILIRUBIN TOTAL: 0.81 mg/dL (ref 0.20–1.20)
BUN: 17 mg/dL (ref 7.0–26.0)
CALCIUM: 10.1 mg/dL (ref 8.4–10.4)
CHLORIDE: 104 meq/L (ref 98–109)
CO2: 25 meq/L (ref 22–29)
CREATININE: 0.9 mg/dL (ref 0.7–1.3)
EGFR: >90 mL/min/{1.73_m2} (ref >90)
Glucose: 118 mg/dl (ref 70–140)
Potassium: 4.4 mEq/L (ref 3.5–5.1)
Sodium: 140 mEq/L (ref 136–145)
TOTAL PROTEIN: 8.6 g/dL — AB (ref 6.4–8.3)

## 2014-12-23 LAB — LACTATE DEHYDROGENASE (CC13): LDH: 183 U/L (ref 125–245)

## 2014-12-23 LAB — CHCC SMEAR

## 2014-12-23 LAB — FERRITIN CHCC: FERRITIN: 197 ng/mL (ref 22–316)

## 2014-12-23 NOTE — Telephone Encounter (Signed)
Patient sent back to lab and given avs report and appointments for November.

## 2014-12-24 DIAGNOSIS — E236 Other disorders of pituitary gland: Secondary | ICD-10-CM | POA: Diagnosis not present

## 2014-12-25 DIAGNOSIS — M47816 Spondylosis without myelopathy or radiculopathy, lumbar region: Secondary | ICD-10-CM | POA: Diagnosis not present

## 2014-12-25 LAB — HEPATITIS B CORE ANTIBODY, TOTAL: Hep B Core Total Ab: NONREACTIVE

## 2014-12-25 LAB — ERYTHROPOIETIN: ERYTHROPOIETIN: 3.9 m[IU]/mL (ref 2.6–18.5)

## 2014-12-25 LAB — HIV ANTIBODY (ROUTINE TESTING W REFLEX): HIV 1&2 Ab, 4th Generation: NONREACTIVE

## 2014-12-25 LAB — HEPATITIS C ANTIBODY: HCV Ab: NEGATIVE

## 2014-12-25 LAB — C-REACTIVE PROTEIN: CRP: 1.5 mg/dL — AB (ref <0.60)

## 2014-12-25 LAB — SEDIMENTATION RATE: Sed Rate: 1 mm/hr (ref 0–20)

## 2014-12-25 LAB — HEPATITIS B SURFACE ANTIGEN: Hepatitis B Surface Ag: NEGATIVE

## 2014-12-29 ENCOUNTER — Telehealth: Payer: Self-pay | Admitting: *Deleted

## 2014-12-29 DIAGNOSIS — R634 Abnormal weight loss: Secondary | ICD-10-CM

## 2014-12-29 NOTE — Telephone Encounter (Signed)
Called patient to ask that he bring his urine back for the HIAA urine test (this was cancelled by [facility] because they did not receive specimen). Patient states that he did bring back the urine on the Monday following his office visit and gave it to a staff member at the first desk in the lab. Unfortunately, the lab has no record of this. I have asked the patient to do this test again. He states that he will do this next Monday.

## 2015-01-05 ENCOUNTER — Other Ambulatory Visit: Payer: Medicare HMO

## 2015-01-05 DIAGNOSIS — R634 Abnormal weight loss: Secondary | ICD-10-CM | POA: Diagnosis not present

## 2015-01-05 DIAGNOSIS — E118 Type 2 diabetes mellitus with unspecified complications: Secondary | ICD-10-CM | POA: Diagnosis not present

## 2015-01-07 DIAGNOSIS — E230 Hypopituitarism: Secondary | ICD-10-CM | POA: Diagnosis not present
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
## 2015-01-08 ENCOUNTER — Telehealth: Payer: Self-pay | Admitting: *Deleted

## 2015-01-08 ENCOUNTER — Other Ambulatory Visit: Payer: Self-pay | Admitting: Internal Medicine

## 2015-01-08 DIAGNOSIS — E118 Type 2 diabetes mellitus with unspecified complications: Secondary | ICD-10-CM | POA: Diagnosis not present

## 2015-01-08 DIAGNOSIS — R634 Abnormal weight loss: Secondary | ICD-10-CM

## 2015-01-08 NOTE — Telephone Encounter (Signed)
-----   Message from Jerene Bears, MD sent at 01/08/2015  8:39 AM EST ----- Hey Look at the note from Dr. Irene Limbo with [facility]. I want to be sure the following gets ordered and completed.  "neuron-specific enolase as well as a 24-hour urine for 5 HIAA. -Would also recommend 24-hour VMA and urinary metanephrines as well as serum metanephrines to rule out pheochromocytoma."  Thanks JMP   ----- Message -----    From: Brunetta Genera, MD    Sent: 01/07/2015   6:58 PM      To: Jerene Bears, MD  Dear Doctor Hilarie Fredrickson, I appreciate the privilege of taking care of your patient. I would like to forward my notes for your consideration. Kindly let me know if there are any other questions or concerns.  Regards, Sullivan Lone MD [place] Hematology/Oncology Physician [facility]  (Office):       [phone] (Work cell):  [phone] (Fax):           [phone]

## 2015-01-08 NOTE — Progress Notes (Signed)
Dr Hilarie Fredrickson- please look over these orders and confirm that they are what you would like.Marland KitchenMarland KitchenMarland Kitchen

## 2015-01-14 LAB — 5 HIAA, QUANTITATIVE, URINE, 24 HOUR: 5-HIAA, URINE: 11.7 mg/(24.h) — AB (ref <=6.0)

## 2015-01-20 ENCOUNTER — Ambulatory Visit (HOSPITAL_BASED_OUTPATIENT_CLINIC_OR_DEPARTMENT_OTHER): Payer: Medicare HMO | Admitting: Hematology

## 2015-01-20 ENCOUNTER — Telehealth: Payer: Self-pay | Admitting: Hematology

## 2015-01-20 ENCOUNTER — Encounter: Payer: Self-pay | Admitting: Hematology

## 2015-01-20 ENCOUNTER — Other Ambulatory Visit: Payer: Self-pay

## 2015-01-20 ENCOUNTER — Other Ambulatory Visit (HOSPITAL_BASED_OUTPATIENT_CLINIC_OR_DEPARTMENT_OTHER): Payer: Medicare HMO

## 2015-01-20 VITALS — BP 116/69 | HR 66 | Temp 98.8°F | Resp 18 | Ht 67.5 in | Wt 188.7 lb

## 2015-01-20 DIAGNOSIS — D751 Secondary polycythemia: Secondary | ICD-10-CM | POA: Diagnosis not present

## 2015-01-20 DIAGNOSIS — R197 Diarrhea, unspecified: Secondary | ICD-10-CM

## 2015-01-20 DIAGNOSIS — E340 Carcinoid syndrome: Secondary | ICD-10-CM

## 2015-01-20 LAB — COMPREHENSIVE METABOLIC PANEL (CC13)
ALBUMIN: 4.1 g/dL (ref 3.5–5.0)
ALK PHOS: 82 U/L (ref 40–150)
ALT: 38 U/L (ref 0–55)
AST: 26 U/L (ref 5–34)
Anion Gap: 10 mEq/L (ref 3–11)
BILIRUBIN TOTAL: 0.86 mg/dL (ref 0.20–1.20)
BUN: 18.2 mg/dL (ref 7.0–26.0)
CALCIUM: 9.6 mg/dL (ref 8.4–10.4)
CO2: 23 mEq/L (ref 22–29)
CREATININE: 1 mg/dL (ref 0.7–1.3)
Chloride: 106 mEq/L (ref 98–109)
EGFR: 77 mL/min/{1.73_m2} — ABNORMAL LOW (ref >90)
GLUCOSE: 127 mg/dL (ref 70–140)
POTASSIUM: 4.3 meq/L (ref 3.5–5.1)
SODIUM: 139 meq/L (ref 136–145)
TOTAL PROTEIN: 7.9 g/dL (ref 6.4–8.3)

## 2015-01-20 LAB — CBC & DIFF AND RETIC
BASO%: 0.3 % (ref 0.0–2.0)
Basophils Absolute: 0 10e3/uL (ref 0.0–0.1)
EOS%: 1.8 % (ref 0.0–7.0)
Eosinophils Absolute: 0.2 10e3/uL (ref 0.0–0.5)
HCT: 55 % — ABNORMAL HIGH (ref 38.4–49.9)
HGB: 18.7 g/dL — ABNORMAL HIGH (ref 13.0–17.1)
Immature Retic Fract: 3.5 % (ref 3.00–10.60)
LYMPH%: 23.2 % (ref 14.0–49.0)
MCH: 31.2 pg (ref 27.2–33.4)
MCHC: 34 g/dL (ref 32.0–36.0)
MCV: 91.7 fL (ref 79.3–98.0)
MONO#: 0.7 10e3/uL (ref 0.1–0.9)
MONO%: 7.8 % (ref 0.0–14.0)
NEUT#: 6.3 10e3/uL (ref 1.5–6.5)
NEUT%: 66.9 % (ref 39.0–75.0)
Platelets: 210 10e3/uL (ref 140–400)
RBC: 6 10e6/uL — ABNORMAL HIGH (ref 4.20–5.82)
RDW: 13.9 % (ref 11.0–14.6)
Retic %: 1.31 % (ref 0.80–1.80)
Retic Ct Abs: 78.6 10e3/uL (ref 34.80–93.90)
WBC: 9.4 10e3/uL (ref 4.0–10.3)
lymph#: 2.2 10e3/uL (ref 0.9–3.3)

## 2015-01-20 NOTE — Telephone Encounter (Signed)
Gave and printed appt sched and avs for pt for DEC thru Feb

## 2015-01-22 DIAGNOSIS — E236 Other disorders of pituitary gland: Secondary | ICD-10-CM | POA: Diagnosis not present

## 2015-01-23 ENCOUNTER — Telehealth: Payer: Self-pay | Admitting: *Deleted

## 2015-01-23 ENCOUNTER — Ambulatory Visit (INDEPENDENT_AMBULATORY_CARE_PROVIDER_SITE_OTHER)
Admission: RE | Admit: 2015-01-23 | Discharge: 2015-01-23 | Disposition: A | Discharge status: Home / Self Care | Payer: Medicare HMO | Source: Ambulatory Visit | Attending: Internal Medicine | Admitting: Internal Medicine

## 2015-01-23 DIAGNOSIS — R197 Diarrhea, unspecified: Secondary | ICD-10-CM

## 2015-01-23 DIAGNOSIS — E340 Carcinoid syndrome: Secondary | ICD-10-CM

## 2015-01-23 DIAGNOSIS — K8020 Calculus of gallbladder without cholecystitis without obstruction: Secondary | ICD-10-CM | POA: Diagnosis not present

## 2015-01-23 MED ORDER — IOHEXOL 300 MG/ML  SOLN
100.0000 mL | Freq: Once | INTRAMUSCULAR | Status: AC | PRN
  Administered 2015-01-23: 100 mL via INTRAVENOUS

## 2015-01-23 NOTE — Telephone Encounter (Signed)
Error

## 2015-01-26 ENCOUNTER — Ambulatory Visit (HOSPITAL_BASED_OUTPATIENT_CLINIC_OR_DEPARTMENT_OTHER): Payer: Medicare HMO

## 2015-01-26 VITALS — BP 107/59 | HR 70 | Temp 98.5°F | Resp 18

## 2015-01-26 DIAGNOSIS — D751 Secondary polycythemia: Secondary | ICD-10-CM

## 2015-01-26 NOTE — Patient Instructions (Signed)
Therapeutic Phlebotomy, Care After  Refer to this sheet in the next few weeks. These instructions provide you with information about caring for yourself after your procedure. Your health care provider may also give you more specific instructions. Your treatment has been planned according to current medical practices, but problems sometimes occur. Call your health care provider if you have any problems or questions after your procedure.  WHAT TO EXPECT AFTER THE PROCEDURE  After your procedure, it is common to have:   Light-headedness or dizziness. You may feel faint.   Nausea.   Tiredness.  HOME CARE INSTRUCTIONS  Activities   Return to your normal activities as directed by your health care provider. Most people can go back to their normal activities right away.   Avoid strenuous physical activity and heavy lifting or pulling for about 5 hours after the procedure. Do not lift anything that is heavier than 10 lb (4.5 kg).   Athletes should avoid strenuous exercise for at least 12 hours.   Change positions slowly for the remainder of the day. This will help to prevent light-headedness or fainting.   If you feel light-headed, lie down until the feeling goes away.  Eating and Drinking   Be sure to eat well-balanced meals for the next 24 hours.   Drink enough fluid to keep your urine clear or pale yellow.   Avoid drinking alcohol on the day that you had the procedure.  Care of the Needle Insertion Site   Keep your bandage dry. You can remove the bandage after about 5 hours or as directed by your health care provider.   If you have bleeding from the needle insertion site, elevate your arm and press firmly on the site until the bleeding stops.   If you have bruising at the site, apply ice to the area:   Put ice in a plastic bag.   Place a towel between your skin and the bag.   Leave the ice on for 20 minutes, 2-3 times a day for the first 24 hours.   If the swelling does not go away after 24 hours, apply  a warm, moist washcloth to the area for 20 minutes, 2-3 times a day.  General Instructions   Avoid smoking for at least 30 minutes after the procedure.   Keep all follow-up visits as directed by your health care provider. It is important to continue with further therapeutic phlebotomy treatments as directed.  SEEK MEDICAL CARE IF:   You have redness, swelling, or pain at the needle insertion site.   You have fluid, blood, or pus coming from the needle insertion site.   You feel light-headed, dizzy, or nauseated, and the feeling does not go away.   You notice new bruising at the needle insertion site.   You feel weaker than normal.   You have a fever or chills.  SEEK IMMEDIATE MEDICAL CARE IF:   You have severe nausea or vomiting.   You have chest pain.   You have trouble breathing.    This information is not intended to replace advice given to you by your health care provider. Make sure you discuss any questions you have with your health care provider.    Document Released: 07/12/2010 Document Revised: 06/24/2014 Document Reviewed: 02/03/2014  Elsevier Interactive Patient Education 2016 [employer]

## 2015-01-26 NOTE — Progress Notes (Signed)
Phlebotomy obtained from L antecubital via 16 gauge phlebotomy set. 540 gm obtained. Pt tolerated without difficulty. Refreshments given.  Discharge instructions reviewed. Teach back complete.

## 2015-01-27 ENCOUNTER — Ambulatory Visit (INDEPENDENT_AMBULATORY_CARE_PROVIDER_SITE_OTHER): Payer: Medicare HMO | Admitting: Internal Medicine

## 2015-01-27 ENCOUNTER — Encounter: Payer: Self-pay | Admitting: Internal Medicine

## 2015-01-27 VITALS — BP 100/50 | HR 72 | Ht 66.75 in | Wt 189.5 lb

## 2015-01-27 DIAGNOSIS — R195 Other fecal abnormalities: Secondary | ICD-10-CM

## 2015-01-27 DIAGNOSIS — D751 Secondary polycythemia: Secondary | ICD-10-CM | POA: Diagnosis not present

## 2015-01-27 DIAGNOSIS — R634 Abnormal weight loss: Secondary | ICD-10-CM

## 2015-01-27 NOTE — Progress Notes (Signed)
Subjective:    Patient ID: Stephen Herrera, male    DOB: 01-28-1956, 59 y.o.   MRN: [id number]  HPI Stephen Herrera is a 59 yo male with PMH of GERD, IBS, adenomatous colon polyps, bacterial overgrowth, and ongoing issues with weight loss, diarrhea and night sweats who is seen for follow-up. He also has a history of polycythemia and he is seeing Dr. Irene Limbo for this issue. He has recently started phlebotomy and had one unit of packed red cells removed yesterday. Target hematocrit of less than 50.  Since his last visit he returned his 24-hour urine 5 HIAA which was found to be elevated. Subsequent to this he had a triphasic abdominal and pelvic CT scan with contrast which was largely unrevealing. No evidence of carcinoid tumor. He was retreated with metronidazole to see if this would help his loose stools  He reports that Flagyl treatment did seem to help his diarrhea. He is now having 3-4 stools per day. Normal for him is to wake up and have a formed if not hard stool followed shortly by a more loose stool followed shortly by a large explosive bowel movement. He will often not have additional bowel movement for the remainder of the day though occasionally in the afternoon he will have another soft bowel movement. No rectal bleeding or melena. No abdominal pain. Appetite is not robust but he reports he is eating very well. He is eating 3 meals a day and snacks. He's eating additional carbohydrates in order to try to gain weight. His weight nadir was in the 170s. He's 189 pounds today having been 195 2 months ago. He reports his night sweats and flushing are less severe recently. He is having some chills at night and he reports feeling like his "thermostat is broken". Again no abdominal pain some minor bloating. He is back on probiotic. He has stopped his metformin. He was concerned about side effects from metformin. Blood sugars have been well controlled in the 100-110 range. He slightly reduced his long acting  insulin dosing. He does continue testosterone injections and low-dose Zoloft   Review of Systems Has per history of present illness, otherwise negative  Current Medications, Allergies, Past Medical History, Past Surgical History, Family History and Social History were reviewed in [employer] record.     Objective:   Physical Exam BP 100/50 mmHg  Pulse 72  Ht 5' 6.75" (1.695 m)  Wt 189 lb 8 oz (85.957 kg)  BMI 29.92 kg/m2 Constitutional: Well-developed and well-nourished. No distress. HEENT: Normocephalic and atraumatic. Oropharynx is clear and moist. No oropharyngeal exudate. Conjunctivae are normal.  No scleral icterus. Neck: Neck supple. Trachea midline. Cardiovascular: Normal rate, regular rhythm and intact distal pulses. No M/R/G Pulmonary/chest: Effort normal and breath sounds normal. No wheezing, rales or rhonchi. Abdominal: Soft, nontender, nondistended. Bowel sounds active throughout. There are no masses palpable. No hepatosplenomegaly. Extremities: no clubbing, cyanosis, or edema Lymphadenopathy: No cervical adenopathy noted. Neurological: Alert and oriented to person place and time. Skin: Skin is warm and dry. No rashes noted. Psychiatric: Normal mood and affect. Behavior is normal.  Urinary 24-hr 5-HIAA -- elevated at 11  CBC    Component Value Date/Time   WBC 9.4 01/20/2015 0803   WBC 9.8 09/13/2014 0620   RBC 6.00* 01/20/2015 0803   RBC 6.18* 09/13/2014 0620   HGB 18.7* 01/20/2015 0803   HGB 18.8* 09/13/2014 0620   HCT 55.0* 01/20/2015 0803   HCT 54.8* 09/13/2014 [id number]  PLT 210 01/20/2015 0803   PLT 215 09/13/2014 0620   MCV 91.7 01/20/2015 0803   MCV 88.7 09/13/2014 0620   MCH 31.2 01/20/2015 0803   MCH 30.4 09/13/2014 0620   MCHC 34.0 01/20/2015 0803   MCHC 34.3 09/13/2014 0620   RDW 13.9 01/20/2015 0803   RDW 13.5 09/13/2014 0620   LYMPHSABS 2.2 01/20/2015 0803   LYMPHSABS 2.0 09/13/2014 0620   MONOABS 0.7 01/20/2015 0803    MONOABS 0.7 09/13/2014 0620   EOSABS 0.2 01/20/2015 0803   EOSABS 0.1 09/13/2014 0620   BASOSABS 0.0 01/20/2015 0803   BASOSABS 0.0 09/13/2014 0620    CMP     Component Value Date/Time   NA 139 01/20/2015 0803   NA 136 11/26/2014 0925   K 4.3 01/20/2015 0803   K 4.0 11/26/2014 0925   CL 100 11/26/2014 0925   CO2 23 01/20/2015 0803   CO2 27 11/26/2014 0925   GLUCOSE 127 01/20/2015 0803   GLUCOSE 158* 11/26/2014 0925   BUN 18.2 01/20/2015 0803   BUN 24* 11/26/2014 0925   CREATININE 1.0 01/20/2015 0803   CREATININE 0.95 11/26/2014 0925   CALCIUM 9.6 01/20/2015 0803   CALCIUM 9.8 11/26/2014 0925   PROT 7.9 01/20/2015 0803   PROT 8.0 11/26/2014 0925   ALBUMIN 4.1 01/20/2015 0803   ALBUMIN 4.5 11/26/2014 0925   AST 26 01/20/2015 0803   AST 27 11/26/2014 0925   ALT 38 01/20/2015 0803   ALT 31 11/26/2014 0925   ALKPHOS 82 01/20/2015 0803   ALKPHOS 78 11/26/2014 0925   BILITOT 0.86 01/20/2015 0803   BILITOT 0.9 11/26/2014 0925   GFRNONAA >60 09/13/2014 0620   GFRAA >60 09/13/2014 0620   CT ABDOMEN AND PELVIS WITHOUT AND WITH CONTRAST -- Nov 2016   TECHNIQUE: Multidetector CT imaging of the abdomen and pelvis was performed following the standard protocol before and following the bolus administration of intravenous contrast.   CONTRAST:  11mL OMNIPAQUE IOHEXOL 300 MG/ML  SOLN   COMPARISON:  CT the abdomen and pelvis 04/19/2012.   FINDINGS: Lower chest: Atherosclerotic calcifications in the right coronary artery.   Hepatobiliary: No cystic or solid hepatic lesions. No intra or extrahepatic biliary ductal dilatation. Calcified gallstones measuring up to 1 cm in the gallbladder. Gallbladder is largely decompressed. No gallbladder wall thickening, pericholecystic fluid or pericholecystic inflammatory changes to suggest an acute cholecystitis at this time.   Pancreas: No pancreatic mass. No pancreatic ductal dilatation. No pancreatic or peripancreatic fluid or  inflammatory changes.   Spleen: Unremarkable.   Adrenals/Urinary Tract: 11 mm exophytic lesion in the anterior aspect of the lower pole of the right kidney is compatible with a small exophytic simple cyst. Left kidney and bilateral adrenal glands are normal in appearance. No hydroureteronephrosis. Urinary bladder is generally normal in appearance, although a portion of the bladder is slightly deviated from left to right, presumably related to a left pelvic sidewall lipoma as previously described.   Stomach/Bowel: Normal appearance of the stomach. No pathologic dilatation of small bowel or colon. Normal appendix.   Vascular/Lymphatic: Atherosclerosis throughout the abdominal and pelvic vasculature, without evidence of aneurysm or dissection. No lymphadenopathy noted in the abdomen or pelvis.   Reproductive: Prostate gland and seminal vesicles are unremarkable in appearance.   Other: Tiny left inguinal hernia containing only fat. No definite mesenteric mass. No significant volume of ascites. No pneumoperitoneum.   Musculoskeletal: Again noted is a very well-defined fatty attenuation lesion along the left pelvic sidewall, which measures  approximately 5.3 x 2.9 x 4.9 cm (image 121 of series 6 and coronal image 70 of series 606), similar to prior examination, most likely a lipoma. There are no aggressive appearing lytic or blastic lesions noted in the visualized portions of the skeleton.   IMPRESSION: 1. No acute findings in the abdomen or pelvis to account for the patient's symptoms. 2. No evidence of malignancy in the abdomen or pelvis. Specifically, no findings to suggest carcinoid tumor. 3. Cholelithiasis without evidence of acute cholecystitis at this time. 4. Atherosclerosis, including right coronary artery disease. Please note that although the presence of coronary artery calcium documents the presence of coronary artery disease, the severity of this disease and any  potential stenosis cannot be assessed on this non-gated CT examination. Assessment for potential risk factor modification, dietary therapy or pharmacologic therapy may be warranted, if clinically indicated. 5. Additional incidental findings, as above.     Electronically Signed   By: Vinnie Langton M.D.   On: 01/23/2015 13:29      Assessment & Plan:  59 yo male with PMH of GERD, IBS, adenomatous colon polyps, bacterial overgrowth, and ongoing issues with weight loss, diarrhea and night sweats who is seen for follow-up  1. Elevated urinary 5-HIAA, possible carcinoid -- the elevated urinary 5 HIAA is very suggestive of carcinoid. That said, carcinoid tumor was not localized by triple phase abdominal CT scanning. Octreotide nuclear medicine imaging can be complementary versus functional imaging with gallium-68 dotatate PET scanning.  I do not know if the latter is available here in [place].  Symptoms at present are fairly well controlled. He is not having the excessive diarrhea that he was previously having. He is also having less flushing and less night sweating. We have discussed possible treatment with Depo octreotide which would likely control symptoms if indeed carcinoid is the issue here. I will discuss with radiology and we will pursue additional imaging to look for occult carcinoid.  2. Polycythemia -- follow with Dr. Irene Limbo and undergoing phlebotomy  Return visit with me in 3-4 months though possibly sooner depending on imaging results. Notify me of any major change in symptom, diarrhea, flushing, abdominal pain, etc.  CC: Primary care Dr. Laurann Montana and hematology/oncology Dr. Irene Limbo  25 minutes spent with the patient today. Greater than 50% was spent in counseling and coordination of care with the patient

## 2015-01-27 NOTE — Patient Instructions (Signed)
Continue current plan for now. Dr Hilarie Fredrickson will consult with radiology regarding further workup for ? Carcinoid. We will contact you with plans after that consult.  Please follow up with Dr Hilarie Fredrickson in 4-5 months.

## 2015-01-29 ENCOUNTER — Telehealth: Payer: Self-pay | Admitting: *Deleted

## 2015-01-29 NOTE — Telephone Encounter (Signed)
Dr Hilarie Fredrickson has contacted [facility] @ [email or web address] regarding Gallium 68 dotatate PET/CT. Per Sharyn Lull, this particular study will be available around late December or January. Per Dr Hilarie Fredrickson, we will attempt to get this study scheduled at [facility] when available. I have contacted the patient to advise him of this. Patient seems quite frustrated with this decision and indicates that he wants to have the testing done prior to the end of the year. I explained that this test is not yet available anywhere in the state and that the first availability will be in late December or January. Patient wants to be referred elsewhere for this particular test wherever it is available as he says "this should not be the only option. There are other options". Please advise. Do you know of any other facilities already offering the test?

## 2015-01-30 NOTE — Telephone Encounter (Signed)
Patient was made aware previously that these are not available in the state. He states he is willing to go to another state to get test done sooner. Are you aware of any other hospitals  In any states nearby that already offer the test?

## 2015-01-30 NOTE — Telephone Encounter (Signed)
I understand his frustration According to [facility] there are NO hospitals in [place] offering this test at present. Please call [facility] and [facility] radiology to confirm The available test is octreotide scan but this is less sensitive with lower yield for him.  Choosing between the 2 tests, the PET is felt to be the best test for him. Octreotide scan can be performed but of low yield may not be worth the expense
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
## 2015-02-09 NOTE — Telephone Encounter (Signed)
Dr Hilarie Fredrickson, please see note from [place].

## 2015-02-12 ENCOUNTER — Telehealth: Payer: Self-pay | Admitting: *Deleted

## 2015-02-12 NOTE — Telephone Encounter (Signed)
I have called and moved his appts from 1/3 to 1/4 due to the holiday. Patient agreeable to move appts. Patient given new time/date. Calendar sent.  JMW

## 2015-02-18 DIAGNOSIS — E291 Testicular hypofunction: Secondary | ICD-10-CM | POA: Diagnosis not present

## 2015-02-24 ENCOUNTER — Other Ambulatory Visit: Payer: Medicare HMO

## 2015-02-25 ENCOUNTER — Ambulatory Visit (HOSPITAL_BASED_OUTPATIENT_CLINIC_OR_DEPARTMENT_OTHER): Payer: Medicare HMO

## 2015-02-25 ENCOUNTER — Other Ambulatory Visit (HOSPITAL_BASED_OUTPATIENT_CLINIC_OR_DEPARTMENT_OTHER): Payer: Medicare HMO

## 2015-02-25 ENCOUNTER — Telehealth: Payer: Self-pay | Admitting: Hematology

## 2015-02-25 ENCOUNTER — Other Ambulatory Visit: Payer: Self-pay | Admitting: *Deleted

## 2015-02-25 VITALS — BP 116/67 | HR 87 | Temp 98.7°F | Resp 18

## 2015-02-25 DIAGNOSIS — D751 Secondary polycythemia: Secondary | ICD-10-CM

## 2015-02-25 LAB — COMPREHENSIVE METABOLIC PANEL
ALBUMIN: 4 g/dL (ref 3.5–5.0)
ALK PHOS: 64 U/L (ref 40–150)
ALT: 23 U/L (ref 0–55)
ANION GAP: 10 meq/L (ref 3–11)
AST: 17 U/L (ref 5–34)
BUN: 19.2 mg/dL (ref 7.0–26.0)
CALCIUM: 8.8 mg/dL (ref 8.4–10.4)
CO2: 23 mEq/L (ref 22–29)
Chloride: 109 mEq/L (ref 98–109)
Creatinine: 0.9 mg/dL (ref 0.7–1.3)
Glucose: 182 mg/dl — ABNORMAL HIGH (ref 70–140)
POTASSIUM: 3.6 meq/L (ref 3.5–5.1)
SODIUM: 142 meq/L (ref 136–145)
Total Bilirubin: 0.45 mg/dL (ref 0.20–1.20)
Total Protein: 7.4 g/dL (ref 6.4–8.3)

## 2015-02-25 LAB — CBC & DIFF AND RETIC
BASO%: 0.5 % (ref 0.0–2.0)
BASOS ABS: 0 10*3/uL (ref 0.0–0.1)
EOS%: 2.7 % (ref 0.0–7.0)
Eosinophils Absolute: 0.2 10*3/uL (ref 0.0–0.5)
HEMATOCRIT: 50 % — AB (ref 38.4–49.9)
HGB: 17.2 g/dL — ABNORMAL HIGH (ref 13.0–17.1)
Immature Retic Fract: 1.6 % — ABNORMAL LOW (ref 3.00–10.60)
LYMPH%: 31.2 % (ref 14.0–49.0)
MCH: 30.9 pg (ref 27.2–33.4)
MCHC: 34.4 g/dL (ref 32.0–36.0)
MCV: 89.8 fL (ref 79.3–98.0)
MONO#: 0.5 10*3/uL (ref 0.1–0.9)
MONO%: 7.3 % (ref 0.0–14.0)
NEUT#: 3.9 10*3/uL (ref 1.5–6.5)
NEUT%: 58.3 % (ref 39.0–75.0)
PLATELETS: 211 10*3/uL (ref 140–400)
RBC: 5.57 10*6/uL (ref 4.20–5.82)
RDW: 12.9 % (ref 11.0–14.6)
Retic %: 1.03 % (ref 0.80–1.80)
Retic Ct Abs: 57.37 10*3/uL (ref 34.80–93.90)
WBC: 6.6 10*3/uL (ref 4.0–10.3)
lymph#: 2.1 10*3/uL (ref 0.9–3.3)

## 2015-02-25 LAB — FERRITIN: Ferritin: 177 ng/ml (ref 22–316)

## 2015-02-25 NOTE — Progress Notes (Signed)
Therapeutic phlebotomy performed with 16G needle in L antecubital, start 0903 and end 0909.  Patient tolerated well, he ate prior to coming and is now drinking his beverage. Declined any other diet offerings at this time.  522G obtained via the phlebotomy.

## 2015-02-25 NOTE — Telephone Encounter (Signed)
"  [email or web address]>  Today, 10:36 AM  Earlie Server,  I will connect with the radiopharmacy director later today to get an update on when the Ga-68 generator will online. I will update you on my findings.  Thanks, Rica Mast, MHA, MSL, CNMT Physicist, medical, Nuclear Medicine/Nuclear Cardiology/PET [facility] Radiology Department [phone] Office [phone] Pager

## 2015-02-25 NOTE — Patient Instructions (Signed)
Therapeutic Phlebotomy, Care After  Refer to this sheet in the next few weeks. These instructions provide you with information about caring for yourself after your procedure. Your health care provider may also give you more specific instructions. Your treatment has been planned according to current medical practices, but problems sometimes occur. Call your health care provider if you have any problems or questions after your procedure.  WHAT TO EXPECT AFTER THE PROCEDURE  After your procedure, it is common to have:   Light-headedness or dizziness. You may feel faint.   Nausea.   Tiredness.  HOME CARE INSTRUCTIONS  Activities   Return to your normal activities as directed by your health care provider. Most people can go back to their normal activities right away.   Avoid strenuous physical activity and heavy lifting or pulling for about 5 hours after the procedure. Do not lift anything that is heavier than 10 lb (4.5 kg).   Athletes should avoid strenuous exercise for at least 12 hours.   Change positions slowly for the remainder of the day. This will help to prevent light-headedness or fainting.   If you feel light-headed, lie down until the feeling goes away.  Eating and Drinking   Be sure to eat well-balanced meals for the next 24 hours.   Drink enough fluid to keep your urine clear or pale yellow.   Avoid drinking alcohol on the day that you had the procedure.  Care of the Needle Insertion Site   Keep your bandage dry. You can remove the bandage after about 5 hours or as directed by your health care provider.   If you have bleeding from the needle insertion site, elevate your arm and press firmly on the site until the bleeding stops.   If you have bruising at the site, apply ice to the area:   Put ice in a plastic bag.   Place a towel between your skin and the bag.   Leave the ice on for 20 minutes, 2-3 times a day for the first 24 hours.   If the swelling does not go away after 24 hours, apply  a warm, moist washcloth to the area for 20 minutes, 2-3 times a day.  General Instructions   Avoid smoking for at least 30 minutes after the procedure.   Keep all follow-up visits as directed by your health care provider. It is important to continue with further therapeutic phlebotomy treatments as directed.  SEEK MEDICAL CARE IF:   You have redness, swelling, or pain at the needle insertion site.   You have fluid, blood, or pus coming from the needle insertion site.   You feel light-headed, dizzy, or nauseated, and the feeling does not go away.   You notice new bruising at the needle insertion site.   You feel weaker than normal.   You have a fever or chills.  SEEK IMMEDIATE MEDICAL CARE IF:   You have severe nausea or vomiting.   You have chest pain.   You have trouble breathing.    This information is not intended to replace advice given to you by your health care provider. Make sure you discuss any questions you have with your health care provider.    Document Released: 07/12/2010 Document Revised: 06/24/2014 Document Reviewed: 02/03/2014  Elsevier Interactive Patient Education 2016 [employer]

## 2015-02-25 NOTE — Telephone Encounter (Signed)
Per pof appointment made for 1/5 per pt request

## 2015-02-25 NOTE — Telephone Encounter (Signed)
Stephen Herrera has again been contacted at [facility] and we are awaiting return correspondence.

## 2015-02-25 NOTE — Telephone Encounter (Signed)
[name], please remain in contact with 2 to see when this test is available so it can be ordered

## 2015-02-26 ENCOUNTER — Ambulatory Visit (HOSPITAL_BASED_OUTPATIENT_CLINIC_OR_DEPARTMENT_OTHER): Payer: Medicare HMO | Admitting: Hematology

## 2015-02-26 ENCOUNTER — Other Ambulatory Visit (HOSPITAL_BASED_OUTPATIENT_CLINIC_OR_DEPARTMENT_OTHER): Payer: Medicare HMO

## 2015-02-26 ENCOUNTER — Telehealth: Payer: Self-pay | Admitting: Hematology

## 2015-02-26 ENCOUNTER — Encounter: Payer: Self-pay | Admitting: Hematology

## 2015-02-26 VITALS — BP 130/77 | HR 88 | Temp 98.3°F | Resp 18 | Ht 66.75 in | Wt 186.1 lb

## 2015-02-26 DIAGNOSIS — D751 Secondary polycythemia: Secondary | ICD-10-CM | POA: Diagnosis not present

## 2015-02-26 DIAGNOSIS — R197 Diarrhea, unspecified: Secondary | ICD-10-CM

## 2015-02-26 DIAGNOSIS — R61 Generalized hyperhidrosis: Secondary | ICD-10-CM

## 2015-02-26 DIAGNOSIS — L74519 Primary focal hyperhidrosis, unspecified: Secondary | ICD-10-CM

## 2015-02-26 DIAGNOSIS — E340 Carcinoid syndrome: Secondary | ICD-10-CM | POA: Insufficient documentation

## 2015-02-26 DIAGNOSIS — R634 Abnormal weight loss: Secondary | ICD-10-CM

## 2015-02-26 NOTE — Telephone Encounter (Signed)
Sent pt back to lab today. Per pt, no f/u appt at this time. MD is awaiting test results. Msg to MD to confirm.

## 2015-03-02 DIAGNOSIS — E230 Hypopituitarism: Secondary | ICD-10-CM | POA: Diagnosis not present

## 2015-03-02 DIAGNOSIS — Z794 Long term (current) use of insulin: Secondary | ICD-10-CM | POA: Diagnosis not present

## 2015-03-02 DIAGNOSIS — Z125 Encounter for screening for malignant neoplasm of prostate: Secondary | ICD-10-CM | POA: Diagnosis not present

## 2015-03-02 DIAGNOSIS — Z5181 Encounter for therapeutic drug level monitoring: Secondary | ICD-10-CM | POA: Diagnosis not present

## 2015-03-02 DIAGNOSIS — E119 Type 2 diabetes mellitus without complications: Secondary | ICD-10-CM | POA: Diagnosis not present

## 2015-03-02 DIAGNOSIS — D751 Secondary polycythemia: Secondary | ICD-10-CM | POA: Diagnosis not present

## 2015-03-02 DIAGNOSIS — R61 Generalized hyperhidrosis: Secondary | ICD-10-CM | POA: Diagnosis not present

## 2015-03-02 DIAGNOSIS — L74519 Primary focal hyperhidrosis, unspecified: Secondary | ICD-10-CM | POA: Diagnosis not present

## 2015-03-02 DIAGNOSIS — Z7984 Long term (current) use of oral hypoglycemic drugs: Secondary | ICD-10-CM | POA: Diagnosis not present

## 2015-03-02 DIAGNOSIS — R197 Diarrhea, unspecified: Secondary | ICD-10-CM | POA: Diagnosis not present

## 2015-03-04 DIAGNOSIS — K7581 Nonalcoholic steatohepatitis (NASH): Secondary | ICD-10-CM | POA: Diagnosis not present

## 2015-03-04 DIAGNOSIS — E119 Type 2 diabetes mellitus without complications: Secondary | ICD-10-CM | POA: Diagnosis not present

## 2015-03-04 DIAGNOSIS — D751 Secondary polycythemia: Secondary | ICD-10-CM | POA: Diagnosis not present

## 2015-03-04 DIAGNOSIS — Z5181 Encounter for therapeutic drug level monitoring: Secondary | ICD-10-CM | POA: Diagnosis not present

## 2015-03-04 DIAGNOSIS — Z7984 Long term (current) use of oral hypoglycemic drugs: Secondary | ICD-10-CM | POA: Diagnosis not present

## 2015-03-04 DIAGNOSIS — Z125 Encounter for screening for malignant neoplasm of prostate: Secondary | ICD-10-CM | POA: Diagnosis not present

## 2015-03-04 DIAGNOSIS — E230 Hypopituitarism: Secondary | ICD-10-CM | POA: Diagnosis not present

## 2015-03-05 LAB — NEURON-SPECIFIC ENOLASE(NSE), BLOOD: Neuron Specific Enolase: 10.1 ng/mL (ref <10.8)

## 2015-03-05 LAB — METANEPHRINES, PLASMA
Metanephrine, Free: 70 pg/mL — ABNORMAL HIGH (ref <=57)
NORMETANEPHRINE FREE: 71 pg/mL (ref <=148)
TOTAL METANEPHRINES-PLASMA: 141 pg/mL (ref <=205)

## 2015-03-06 DIAGNOSIS — R69 Illness, unspecified: Secondary | ICD-10-CM | POA: Diagnosis not present

## 2015-03-06 DIAGNOSIS — L749 Eccrine sweat disorder, unspecified: Secondary | ICD-10-CM | POA: Diagnosis not present

## 2015-03-06 DIAGNOSIS — D751 Secondary polycythemia: Secondary | ICD-10-CM | POA: Diagnosis not present

## 2015-03-07 LAB — METANEPHRINES, URINE, 24 HOUR
METANEPH TOTAL UR: 669 ug/(24.h) (ref 224–832)
METANEPHRINES UR: 239 ug/(24.h) (ref 90–315)
NORMETANEPHRINE 24H UR: 430 ug/(24.h) (ref 122–676)
VOLUME, URINE-METAN: 1900 mL

## 2015-03-07 LAB — 5 HIAA, QUANTITATIVE, URINE, 24 HOUR
5-HIAA, URINE: 5.9 mg/(24.h) (ref <=6.0)
VOLUME, URINE-5HIAA: 1900 mL/(24.h)

## 2015-03-12 ENCOUNTER — Other Ambulatory Visit: Payer: Self-pay | Admitting: Neurosurgery

## 2015-03-12 ENCOUNTER — Ambulatory Visit
Admission: RE | Admit: 2015-03-12 | Discharge: 2015-03-12 | Disposition: A | Discharge status: Home / Self Care | Payer: Medicare HMO | Source: Ambulatory Visit | Attending: Neurosurgery | Admitting: Neurosurgery

## 2015-03-12 DIAGNOSIS — M4306 Spondylolysis, lumbar region: Secondary | ICD-10-CM

## 2015-03-12 DIAGNOSIS — G8929 Other chronic pain: Secondary | ICD-10-CM

## 2015-03-12 DIAGNOSIS — M5136 Other intervertebral disc degeneration, lumbar region: Secondary | ICD-10-CM | POA: Diagnosis not present

## 2015-03-12 DIAGNOSIS — M549 Dorsalgia, unspecified: Principal | ICD-10-CM

## 2015-03-12 DIAGNOSIS — M4322 Fusion of spine, cervical region: Secondary | ICD-10-CM | POA: Diagnosis not present

## 2015-03-17 ENCOUNTER — Telehealth: Payer: Self-pay | Admitting: Internal Medicine

## 2015-03-17 NOTE — Telephone Encounter (Signed)
Dr Hilarie Fredrickson and I have been in touch with [facility] Radiology about this and we are awaiting a response to find out a definitive date about the availability of the gallium 68 test.

## 2015-03-17 NOTE — Telephone Encounter (Signed)
[name] do you know what was supposed to be ordered at [facility] for this pt?

## 2015-03-17 NOTE — Telephone Encounter (Signed)
Spoke with pt and let him know the information below regarding the Gallium 68 test. Pt wants to know if there is any other test that could be ordered instead of the Gallium 68. Please advise.

## 2015-03-17 NOTE — Telephone Encounter (Signed)
I have advised Stephen Herrera that Dr Hilarie Fredrickson and I have been in contact with [facility] regarding the test and that we will be in touch once they have given a definitive date.

## 2015-03-18 DIAGNOSIS — E236 Other disorders of pituitary gland: Secondary | ICD-10-CM | POA: Diagnosis not present

## 2015-03-19 NOTE — Telephone Encounter (Signed)
I understand his anxiousness to have the test performed I have left a message at [facility] radiology regarding the availability of this study. This is felt to be the best study for him I have asked [facility] to call me back at the office and speak to me, Vaughan Basta or Aram Beecham, MHA, MSL, CNMT Chief Technologist, Nuclear Medicine/Nuclear Cardiology/PET [facility] Radiology Department [phone] Office [phone] Pager
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
## 2015-03-19 NOTE — Telephone Encounter (Signed)
I spoke to Rica Mast at [facility] radiology She hopes to have the study on line and the first 2 weeks of February Once it is on line we can call to schedule To schedule we will call [phone] which is PET radiology scheduling and request outside referral form Please place reminder for February 6 to call [facility] again and see if this study can be scheduled

## 2015-03-19 NOTE — Telephone Encounter (Signed)
Left message for pt to call back  °

## 2015-03-20 ENCOUNTER — Telehealth: Payer: Self-pay | Admitting: Hematology

## 2015-03-20 NOTE — Telephone Encounter (Signed)
Left message for pt to call back  °

## 2015-03-20 NOTE — Telephone Encounter (Signed)
Spoke with pt and he is aware. 

## 2015-03-20 NOTE — Telephone Encounter (Signed)
per pof to sch pt appt-cld & spoke to pt and gave pt time & date of appts

## 2015-03-31 ENCOUNTER — Other Ambulatory Visit (HOSPITAL_BASED_OUTPATIENT_CLINIC_OR_DEPARTMENT_OTHER): Payer: Medicare HMO

## 2015-03-31 ENCOUNTER — Ambulatory Visit (HOSPITAL_BASED_OUTPATIENT_CLINIC_OR_DEPARTMENT_OTHER): Payer: Medicare HMO

## 2015-03-31 ENCOUNTER — Ambulatory Visit: Payer: Medicare HMO | Admitting: Hematology

## 2015-03-31 ENCOUNTER — Encounter: Payer: Self-pay | Admitting: *Deleted

## 2015-03-31 VITALS — BP 152/89 | HR 88 | Temp 98.6°F | Resp 20

## 2015-03-31 DIAGNOSIS — D643 Other sideroblastic anemias: Secondary | ICD-10-CM | POA: Diagnosis not present

## 2015-03-31 DIAGNOSIS — D751 Secondary polycythemia: Secondary | ICD-10-CM | POA: Diagnosis not present

## 2015-03-31 LAB — CBC & DIFF AND RETIC
BASO%: 0.5 % (ref 0.0–2.0)
Basophils Absolute: 0 10*3/uL (ref 0.0–0.1)
EOS ABS: 0.2 10*3/uL (ref 0.0–0.5)
EOS%: 2.3 % (ref 0.0–7.0)
HCT: 51.8 % — ABNORMAL HIGH (ref 38.4–49.9)
HEMOGLOBIN: 17.1 g/dL (ref 13.0–17.1)
Immature Retic Fract: 5.8 % (ref 3.00–10.60)
LYMPH%: 27.1 % (ref 14.0–49.0)
MCH: 30.3 pg (ref 27.2–33.4)
MCHC: 33 g/dL (ref 32.0–36.0)
MCV: 91.7 fL (ref 79.3–98.0)
MONO#: 0.6 10*3/uL (ref 0.1–0.9)
MONO%: 9.4 % (ref 0.0–14.0)
NEUT%: 60.7 % (ref 39.0–75.0)
NEUTROS ABS: 4 10*3/uL (ref 1.5–6.5)
Platelets: 224 10*3/uL (ref 140–400)
RBC: 5.65 10*6/uL (ref 4.20–5.82)
RDW: 13.5 % (ref 11.0–14.6)
RETIC %: 1.61 % (ref 0.80–1.80)
Retic Ct Abs: 90.97 10*3/uL (ref 34.80–93.90)
WBC: 6.5 10*3/uL (ref 4.0–10.3)
lymph#: 1.8 10*3/uL (ref 0.9–3.3)

## 2015-03-31 LAB — COMPREHENSIVE METABOLIC PANEL
ALBUMIN: 3.9 g/dL (ref 3.5–5.0)
ALK PHOS: 64 U/L (ref 40–150)
ALT: 22 U/L (ref 0–55)
AST: 18 U/L (ref 5–34)
Anion Gap: 10 mEq/L (ref 3–11)
BUN: 21.3 mg/dL (ref 7.0–26.0)
CALCIUM: 8.9 mg/dL (ref 8.4–10.4)
CHLORIDE: 107 meq/L (ref 98–109)
CO2: 24 mEq/L (ref 22–29)
Creatinine: 0.9 mg/dL (ref 0.7–1.3)
GLUCOSE: 166 mg/dL — AB (ref 70–140)
POTASSIUM: 4.1 meq/L (ref 3.5–5.1)
SODIUM: 141 meq/L (ref 136–145)
Total Bilirubin: 0.33 mg/dL (ref 0.20–1.20)
Total Protein: 7.5 g/dL (ref 6.4–8.3)

## 2015-03-31 LAB — FERRITIN: FERRITIN: 54 ng/mL (ref 22–316)

## 2015-03-31 NOTE — Progress Notes (Signed)
Phlebotomy complete to left ac without difficulty, 550 grams removed from 0903-0910.  Pressure dressing applied.  Pt did not eat snack after phlebotomy.  He states that he will get something to eat "when he leaves here today."  Phlebotomy discharge instructions reviewed with pt and he has no questions at time of discharge.  Pressure dressing remains clean, dry and intact to left ac.

## 2015-03-31 NOTE — Patient Instructions (Signed)
Therapeutic Phlebotomy, Care After  Refer to this sheet in the next few weeks. These instructions provide you with information about caring for yourself after your procedure. Your health care provider may also give you more specific instructions. Your treatment has been planned according to current medical practices, but problems sometimes occur. Call your health care provider if you have any problems or questions after your procedure.  WHAT TO EXPECT AFTER THE PROCEDURE  After your procedure, it is common to have:   Light-headedness or dizziness. You may feel faint.   Nausea.   Tiredness.  HOME CARE INSTRUCTIONS  Activities   Return to your normal activities as directed by your health care provider. Most people can go back to their normal activities right away.   Avoid strenuous physical activity and heavy lifting or pulling for about 5 hours after the procedure. Do not lift anything that is heavier than 10 lb (4.5 kg).   Athletes should avoid strenuous exercise for at least 12 hours.   Change positions slowly for the remainder of the day. This will help to prevent light-headedness or fainting.   If you feel light-headed, lie down until the feeling goes away.  Eating and Drinking   Be sure to eat well-balanced meals for the next 24 hours.   Drink enough fluid to keep your urine clear or pale yellow.   Avoid drinking alcohol on the day that you had the procedure.  Care of the Needle Insertion Site   Keep your bandage dry. You can remove the bandage after about 5 hours or as directed by your health care provider.   If you have bleeding from the needle insertion site, elevate your arm and press firmly on the site until the bleeding stops.   If you have bruising at the site, apply ice to the area:   Put ice in a plastic bag.   Place a towel between your skin and the bag.   Leave the ice on for 20 minutes, 2-3 times a day for the first 24 hours.   If the swelling does not go away after 24 hours, apply  a warm, moist washcloth to the area for 20 minutes, 2-3 times a day.  General Instructions   Avoid smoking for at least 30 minutes after the procedure.   Keep all follow-up visits as directed by your health care provider. It is important to continue with further therapeutic phlebotomy treatments as directed.  SEEK MEDICAL CARE IF:   You have redness, swelling, or pain at the needle insertion site.   You have fluid, blood, or pus coming from the needle insertion site.   You feel light-headed, dizzy, or nauseated, and the feeling does not go away.   You notice new bruising at the needle insertion site.   You feel weaker than normal.   You have a fever or chills.  SEEK IMMEDIATE MEDICAL CARE IF:   You have severe nausea or vomiting.   You have chest pain.   You have trouble breathing.    This information is not intended to replace advice given to you by your health care provider. Make sure you discuss any questions you have with your health care provider.    Document Released: 07/12/2010 Document Revised: 06/24/2014 Document Reviewed: 02/03/2014  Elsevier Interactive Patient Education 2016 [employer]

## 2015-04-02 ENCOUNTER — Ambulatory Visit (HOSPITAL_COMMUNITY): Payer: Medicare HMO

## 2015-04-02 DIAGNOSIS — M47816 Spondylosis without myelopathy or radiculopathy, lumbar region: Secondary | ICD-10-CM | POA: Diagnosis not present

## 2015-04-02 DIAGNOSIS — M5020 Other cervical disc displacement, unspecified cervical region: Secondary | ICD-10-CM | POA: Diagnosis not present

## 2015-04-02 DIAGNOSIS — M4302 Spondylolysis, cervical region: Secondary | ICD-10-CM | POA: Diagnosis not present

## 2015-04-02 DIAGNOSIS — I10 Essential (primary) hypertension: Secondary | ICD-10-CM | POA: Diagnosis not present

## 2015-04-02 NOTE — Telephone Encounter (Signed)
I have spoken to [facility] Radiology. The Gallium 68 test will be offered in 2 weeks. Scheduling number is [phone] or [phone]. I have spoken to [facility] who has taken patient's information and will speak to radiologist to decide when to schedule patient I have advised patient of this as well.

## 2015-04-07 ENCOUNTER — Telehealth: Payer: Self-pay | Admitting: Hematology

## 2015-04-07 DIAGNOSIS — E118 Type 2 diabetes mellitus with unspecified complications: Secondary | ICD-10-CM | POA: Diagnosis not present

## 2015-04-07 NOTE — Telephone Encounter (Signed)
Faxed pt medical records to [facility] [phone]

## 2015-04-08 ENCOUNTER — Telehealth: Payer: Self-pay | Admitting: Hematology

## 2015-04-08 DIAGNOSIS — E118 Type 2 diabetes mellitus with unspecified complications: Secondary | ICD-10-CM | POA: Diagnosis not present

## 2015-04-08 NOTE — Telephone Encounter (Signed)
I have spoken to patient to advise of the information below. He is unfortunately roofing a house right now and is unable to write down any specific information. I advised I will call back tomorrow to give him the information again. He verbalizes understanding.

## 2015-04-08 NOTE — Telephone Encounter (Signed)
Priddy with [facility] states that patient is scheduled for gallium 68 dotatate test on 05-07-15 with a 7:45 am arrival. The gallium 68 will not be mixed until patient arrives. It takes about 1.5 hours to make, so estimated injection time would be around 9:30 am. There is an additional hour wait time and scan time is around 45 minutes. Patient needs to be prepared to be at [facility] for at least 4 hours. He may eat and drink normally. Patient should arrive to the [facility] at [place], [place], [place]. Priddy states that insurance has not been precerted yet but that someone at [facility] will do that about 1 week prior to the scheduled test.

## 2015-04-08 NOTE — Telephone Encounter (Signed)
FAXED PT [place] [phone] PT

## 2015-04-09 NOTE — Telephone Encounter (Signed)
I have spoken to patient and have given information below. He verbalizes understanding. He also confirms that he is not claustrophobic.

## 2015-04-10 ENCOUNTER — Other Ambulatory Visit (HOSPITAL_BASED_OUTPATIENT_CLINIC_OR_DEPARTMENT_OTHER): Payer: Medicare HMO

## 2015-04-10 DIAGNOSIS — R61 Generalized hyperhidrosis: Secondary | ICD-10-CM

## 2015-04-10 DIAGNOSIS — D751 Secondary polycythemia: Secondary | ICD-10-CM

## 2015-04-10 LAB — COMPREHENSIVE METABOLIC PANEL
ALK PHOS: 65 U/L (ref 40–150)
ALT: 25 U/L (ref 0–55)
ANION GAP: 10 meq/L (ref 3–11)
AST: 19 U/L (ref 5–34)
Albumin: 4 g/dL (ref 3.5–5.0)
BILIRUBIN TOTAL: 0.58 mg/dL (ref 0.20–1.20)
BUN: 24.8 mg/dL (ref 7.0–26.0)
CHLORIDE: 107 meq/L (ref 98–109)
CO2: 23 meq/L (ref 22–29)
Calcium: 9 mg/dL (ref 8.4–10.4)
Creatinine: 0.9 mg/dL (ref 0.7–1.3)
GLUCOSE: 120 mg/dL (ref 70–140)
POTASSIUM: 4.1 meq/L (ref 3.5–5.1)
SODIUM: 141 meq/L (ref 136–145)
TOTAL PROTEIN: 7.5 g/dL (ref 6.4–8.3)

## 2015-04-10 LAB — CBC & DIFF AND RETIC
BASO%: 0.9 % (ref 0.0–2.0)
Basophils Absolute: 0.1 10*3/uL (ref 0.0–0.1)
EOS%: 3.1 % (ref 0.0–7.0)
Eosinophils Absolute: 0.2 10*3/uL (ref 0.0–0.5)
HCT: 48.8 % (ref 38.4–49.9)
HGB: 16.2 g/dL (ref 13.0–17.1)
IMMATURE RETIC FRACT: 3.4 % (ref 3.00–10.60)
LYMPH%: 29.1 % (ref 14.0–49.0)
MCH: 30.1 pg (ref 27.2–33.4)
MCHC: 33.2 g/dL (ref 32.0–36.0)
MCV: 90.7 fL (ref 79.3–98.0)
MONO#: 0.5 10*3/uL (ref 0.1–0.9)
MONO%: 8.3 % (ref 0.0–14.0)
NEUT%: 58.6 % (ref 39.0–75.0)
NEUTROS ABS: 3.8 10*3/uL (ref 1.5–6.5)
PLATELETS: 213 10*3/uL (ref 140–400)
RBC: 5.38 10*6/uL (ref 4.20–5.82)
RDW: 13.5 % (ref 11.0–14.6)
Retic %: 1.07 % (ref 0.80–1.80)
Retic Ct Abs: 57.57 10*3/uL (ref 34.80–93.90)
WBC: 6.5 10*3/uL (ref 4.0–10.3)
lymph#: 1.9 10*3/uL (ref 0.9–3.3)

## 2015-04-13 ENCOUNTER — Encounter (HOSPITAL_COMMUNITY)
Admission: RE | Admit: 2015-04-13 | Discharge: 2015-04-13 | Disposition: A | Discharge status: Home / Self Care | Payer: Medicare HMO | Source: Ambulatory Visit | Attending: Hematology | Admitting: Hematology

## 2015-04-13 DIAGNOSIS — R61 Generalized hyperhidrosis: Secondary | ICD-10-CM

## 2015-04-13 DIAGNOSIS — R197 Diarrhea, unspecified: Secondary | ICD-10-CM | POA: Diagnosis not present

## 2015-04-13 LAB — GLUCOSE, CAPILLARY: Glucose-Capillary: 130 mg/dL — ABNORMAL HIGH (ref 65–99)

## 2015-04-13 MED ORDER — FLUDEOXYGLUCOSE F - 18 (FDG) INJECTION
8.9500 | Freq: Once | INTRAVENOUS | Status: AC | PRN
  Administered 2015-04-13: 8.95 via INTRAVENOUS

## 2015-04-17 ENCOUNTER — Telehealth: Payer: Self-pay | Admitting: Hematology

## 2015-04-17 ENCOUNTER — Ambulatory Visit (HOSPITAL_BASED_OUTPATIENT_CLINIC_OR_DEPARTMENT_OTHER): Payer: Medicare HMO | Admitting: Hematology

## 2015-04-17 ENCOUNTER — Encounter: Payer: Self-pay | Admitting: Hematology

## 2015-04-17 VITALS — BP 124/69 | HR 82 | Temp 98.6°F | Resp 18 | Ht 66.75 in | Wt 185.2 lb

## 2015-04-17 DIAGNOSIS — R634 Abnormal weight loss: Secondary | ICD-10-CM | POA: Diagnosis not present

## 2015-04-17 DIAGNOSIS — R61 Generalized hyperhidrosis: Secondary | ICD-10-CM | POA: Diagnosis not present

## 2015-04-17 DIAGNOSIS — D751 Secondary polycythemia: Secondary | ICD-10-CM | POA: Diagnosis not present

## 2015-04-17 DIAGNOSIS — R197 Diarrhea, unspecified: Secondary | ICD-10-CM

## 2015-04-17 DIAGNOSIS — D45 Polycythemia vera: Secondary | ICD-10-CM

## 2015-04-17 NOTE — Telephone Encounter (Signed)
per pof to sch pt appt-gave pt copy of avs °
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
## 2015-04-27 ENCOUNTER — Telehealth: Payer: Self-pay | Admitting: Internal Medicine

## 2015-04-27 NOTE — Telephone Encounter (Signed)
Prity states that she just found out that [facility] does not do any precerts for outside referrals. She was under the impression previously that they would. She states that the CPT number we will need to use is [phone] for PET scan and tell them we are using Gallium 68 Dotatate. The drug code for the gallium is A9587. I will ask our precert coordinator to attempt authorization request for 05/07/15 appointment.

## 2015-04-27 NOTE — Telephone Encounter (Signed)
Prity from [place] called again stating that [id number] is the CPT code.

## 2015-04-27 NOTE — Telephone Encounter (Signed)
Per Tiffany @ [place], NPI is [id number]. Tax ID is [id number].

## 2015-04-27 NOTE — Telephone Encounter (Signed)
Dr Hilarie Fredrickson has spoken to [facility] @ [employer] who states that clinical information will need to be reviewed by a physician and they will contact us with a decision after review.

## 2015-04-28 NOTE — Telephone Encounter (Signed)
We received correspondence from insurance requesting copies of recent office notes, physical exam results, lab results, prior imaging results, prior treatment and any specialty consultation notes in order to make a decision regarding the gallium 68 dotatate PET/CT scan. 61 pages of records sent for review.

## 2015-04-30 ENCOUNTER — Telehealth: Payer: Self-pay | Admitting: Internal Medicine

## 2015-04-30 DIAGNOSIS — E230 Hypopituitarism: Secondary | ICD-10-CM | POA: Diagnosis not present

## 2015-04-30 NOTE — Telephone Encounter (Signed)
I have again spoken at length with Stephen Herrera regarding the insurance coverage for gallium 68 test. Stephen Herrera called the insurance company and didn't get anywhere with it. Stephen Herrera from [place] called while I was on the phone with Stephen Herrera as she was concerned about him after speaking with him on the phone. She states that [place] told her that the test was not covered because we used the same CPT code for PET that he just had recently and that we needed to use a different code and she also stated that they did not have enough information from [place] to approve the test. I explained that we sent 49 pages to [place] on 04/28/15 @ 11:10 am (with faxed confirmation) and that the code for PET with gallium was the same as PET without. The gallium is the drug used (we gave insurance the drug number). I have also explained that Dr Hilarie Fredrickson did a peer to peer review yesterday with a physician and explained the test and that it was a new standard of care for possible carcinoid. He also explained that patient had a recent PET scan showing uptake in the rectum. The test was still denied as the physician said it would be covered at some point but currently is not and there is no time line as to when it could be covered. Stephen Herrera states that she was not aware it was that involved and did not think she could do anything further to help. I contacted Stephen Herrera again to let him know about my conversation with [employer]. Stephen Herrera is seems to be beyond frustrated that he has no answers and doesn't know "where to turn." I have spoken to Dr Hilarie Fredrickson and he will contact patient tomorrow.

## 2015-04-30 NOTE — Telephone Encounter (Signed)
My office staff and I have been working diligently over the last several months to acquire a gallium 68 dotatate PET scan for evaluation of carcinoid. Patient has had weight loss, prior +5 HIAA and explosive diarrhea Stephen Herrera now offers this study just started this month We were asked to precertified the test on their behalf and we have worked with both his insurance company [employer] and Stephen Herrera I spoke to representatives from his insurance company including a Surveyor, mining. She informed me that this test was not covered either by Medicare supplemental insurer, [place] At this point I do not see any other avenue other than self pay to accomplish this test Please notify the patient that despite our great effort, this test is not going to be covered by his insurance He did recently have a standard PET scan ordered by Dr. Irene Limbo This showed possible activity in the anal canal which warrants further investigation. I will reach out to Dr. Irene Limbo deceive rectal exam has been performed recently

## 2015-04-30 NOTE — Telephone Encounter (Signed)
Patient is talking to Medicare per the advice of the insurance commisioner. He seems to think they may be able to get around the insurance denial. He will be in touch with [place] to let us know.

## 2015-04-30 NOTE — Telephone Encounter (Signed)
I have spoken to patient to advise that despite our efforts, insurance has denied coverage of the gallium 68 dotatate scan. Unfortunately, out of pocket cost is about $22,000 (15-16k for gallium and 5k for imaging). Patient expresses his frustration at [employer] for not covering the test and does acknowledge that he knows we have tried what we can. He states that he is still losing about 2 pounds per week. He says Dr Irene Limbo has pretty much exhausted everything he knows to do. He did not do a rectal exam but thought that there was probably nothing to the uptake of anal canal on PET due to previous rectal surgery. I have given him the number that we called for insurance approval so he can voice his concerns with them. Patient states that he will call the insurance company and the next step if he gets nowhere is to have his attorney send [employer] a letter regarding possible legal action should patient be found to have cancer.

## 2015-05-01 ENCOUNTER — Telehealth: Payer: Self-pay | Admitting: Internal Medicine

## 2015-05-01 NOTE — Telephone Encounter (Signed)
Called to let us know that scan has not been approved, case is still open.

## 2015-05-01 NOTE — Telephone Encounter (Signed)
I called the patient today and we spoke by phone He has appealed directly to his insurance company and filed a formal complaint. They are working to see if the gallium 68 PET scan will be covered. He remains very frustrated. He reports being told by his insurance company which he appealed to today that we had not submitted the necessary paperwork. I explained to him that in my opinion this is false. He feels that this may be a "stall tactic" from the insurance company.  I told him that I personally made 2 phone calls to his insurance company initially with an insurance representative last week to initiate prior authorization and then earlier this week I spoke directly to a physician for peer-to-peer. It was in the physician to physician encounter that I was told the test was "not covered" He will continue to work on the appeal and let me know if there is anybody else that we can contact on his back  Regarding the recent PET scan with positive activity in the anal canal or distal rectum. No recent rectal exam though he was seen at [facility] and had an anal ultrasound performed in 2015. This was normal. I have pasted the results below I feel that given overall symptoms being evaluated and positive PET activity in the anorectal area, repeat direct visualization is warranted. The patient agrees. I recommended colonoscopy. We discussed the risks, benefits and alternatives to colonoscopy, he is agreeable to proceed [name], please schedule colonoscopy  He thanked me for the call and all of our work to this regard

## 2015-05-01 NOTE — Telephone Encounter (Signed)
Caller name: Larene Beach Relation to pt: [employer] Call back number:  [phone]   Or [phone] ext [phone]   Reason for call:  Holland Falling is calling regarding the procedures that is needed for the patient and if Peer to Peer is needed.

## 2015-05-04 ENCOUNTER — Telehealth: Payer: Self-pay | Admitting: Internal Medicine

## 2015-05-04 NOTE — Telephone Encounter (Signed)
I have spoken to patient to schedule him for previsit and colonoscopy. Patient verbalizes understanding of times/dates of appointments and also indicates that he thinks we need to reschedule the Gallium 68 test because insurance has still not made any decision regarding if they will cover test after his formal complaint. I have also left a voicemail for Ulice Dash with [facility] to call back so I can get patient's test rescheduled for sometime in April.

## 2015-05-04 NOTE — Telephone Encounter (Signed)
See 04/30/15 phone note.

## 2015-05-04 NOTE — Telephone Encounter (Signed)
I contacted Ulice Dash again at [facility] since I never heard back from him. He states that he will now have to let me know tomorrow about rescheduling test to April. I have also advised patient of this.

## 2015-05-04 NOTE — Telephone Encounter (Signed)
I spoke to Stephen Herrera to advise that we need to reschedule Gallium 68 for now. He will call back later today with an appointment for patient.

## 2015-05-05 ENCOUNTER — Ambulatory Visit (AMBULATORY_SURGERY_CENTER): Payer: Self-pay

## 2015-05-05 VITALS — Ht 67.5 in | Wt 188.8 lb

## 2015-05-05 DIAGNOSIS — R948 Abnormal results of function studies of other organs and systems: Secondary | ICD-10-CM

## 2015-05-05 MED ORDER — NA SULFATE-K SULFATE-MG SULF 17.5-3.13-1.6 GM/177ML PO SOLN: ORAL | Status: DC

## 2015-05-05 NOTE — Progress Notes (Signed)
Per pt, no allergies to soy or egg products.Pt not taking any weight loss meds or using  O2 at home. 

## 2015-05-05 NOTE — Telephone Encounter (Signed)
Ulice Dash called back with [facility] and has rescheduled patient to 06/16/15 with a 7:45 am arrival for Gallium 68 test. I have spoken to patient and have advised of this. He verbalizes understanding.

## 2015-05-06 ENCOUNTER — Encounter: Payer: Self-pay | Admitting: Internal Medicine

## 2015-05-06 ENCOUNTER — Ambulatory Visit (AMBULATORY_SURGERY_CENTER): Payer: Medicare HMO | Admitting: Internal Medicine

## 2015-05-06 VITALS — BP 102/61 | HR 60 | Temp 97.8°F | Resp 12 | Ht 67.0 in | Wt 188.0 lb

## 2015-05-06 DIAGNOSIS — D127 Benign neoplasm of rectosigmoid junction: Secondary | ICD-10-CM | POA: Diagnosis not present

## 2015-05-06 DIAGNOSIS — R948 Abnormal results of function studies of other organs and systems: Secondary | ICD-10-CM

## 2015-05-06 DIAGNOSIS — D124 Benign neoplasm of descending colon: Secondary | ICD-10-CM

## 2015-05-06 DIAGNOSIS — K529 Noninfective gastroenteritis and colitis, unspecified: Secondary | ICD-10-CM | POA: Diagnosis not present

## 2015-05-06 DIAGNOSIS — R933 Abnormal findings on diagnostic imaging of other parts of digestive tract: Secondary | ICD-10-CM | POA: Diagnosis not present

## 2015-05-06 DIAGNOSIS — E119 Type 2 diabetes mellitus without complications: Secondary | ICD-10-CM | POA: Diagnosis not present

## 2015-05-06 LAB — GLUCOSE, CAPILLARY
GLUCOSE-CAPILLARY: 130 mg/dL — AB (ref 65–99)
Glucose-Capillary: 81 mg/dL (ref 65–99)

## 2015-05-06 MED ORDER — SODIUM CHLORIDE 0.9 % IV SOLN
500.0000 mL | INTRAVENOUS | Status: DC
Start: 2015-05-06 — End: 2015-05-06

## 2015-05-06 NOTE — Progress Notes (Signed)
Called to room to assist during endoscopic procedure.  Patient ID and intended procedure confirmed with present staff. Received instructions for my participation in the procedure from the performing physician.  

## 2015-05-06 NOTE — Op Note (Signed)
[place] Patient Name: Stephen Herrera Procedure Date: 05/06/2015 2:44 PM MRN: [id number] Endoscopist: Jerene Bears , MD Age: 60 Referring MD:  Date of Birth: [birth year] Gender: Male Procedure:                Colonoscopy Indications:              Chronic diarrhea, Abnormal PET scan of the GI                            tract, Weight loss Medicines:                Monitored Anesthesia Care Procedure:                Pre-Anesthesia Assessment:                           - Prior to the procedure, a History and Physical                            was performed, and patient medications and                            allergies were reviewed. The patient's tolerance of                            previous anesthesia was also reviewed. The risks                            and benefits of the procedure and the sedation                            options and risks were discussed with the patient.                            All questions were answered, and informed consent                            was obtained. Prior Anticoagulants: The patient has                            taken no previous anticoagulant or antiplatelet                            agents. ASA Grade Assessment: III - A patient with                            severe systemic disease. After reviewing the risks                            and benefits, the patient was deemed in                            satisfactory condition to undergo the procedure.  After obtaining informed consent, the colonoscope                            was passed under direct vision. Throughout the                            procedure, the patient's blood pressure, pulse, and                            oxygen saturations were monitored continuously. The                            Model CF-HQ190L [id number]) scope was introduced                            through the anus and advanced to the 15 cm into the            ileum. The colonoscopy was performed without                            difficulty. The patient tolerated the procedure                            well. The quality of the bowel preparation was                            excellent. The terminal ileum, ileocecal valve,                            appendiceal orifice, and rectum were photographed. Scope In: 2:57:24 PM Scope Out: 3:10:44 PM Scope Withdrawal Time: 0 hours 10 minutes 26 seconds  Total Procedure Duration: 0 hours 13 minutes 20 seconds  Findings:      The perianal and digital rectal examinations were normal. Pertinent       negatives include no palpable rectal lesions. Anoscopy was also       performed which revealed small internal hemorrhoids, but no visible       lesions.      The terminal ileum appeared normal.      A 4 mm polyp was found in the descending colon. The polyp was sessile.       The polyp was removed with a cold snare. Resection and retrieval were       complete.      A few small-mouthed diverticula were found in the splenic flexure and       distal ascending colon.      The colon mucosa (entire examined portion) appeared normal. Biopsies for       histology were taken with a cold forceps from the right colon and left       colon for evaluation of microscopic colitis.      The retroflexed view of the distal rectum and anal verge showed       prominent vascular pattern at the dentate line which is nonspecific. Complications:            No immediate complications. Estimated Blood Loss:     Estimated blood loss was minimal. Impression:               -  The examined portion of the ileum was normal.                           - One 4 mm polyp in the descending colon, removed                            with a cold snare. Resected and retrieved.                           - Mild diverticulosis at the splenic flexure and in                            the distal ascending colon.                           - The  entire examined colon is normal. Biopsied.                           - The distal rectum is normal with nonspecific                            vascular prominence at the anal verge. Recommendation:           - Patient has a contact number available for                            emergencies. The signs and symptoms of potential                            delayed complications were discussed with the                            patient. Return to normal activities tomorrow.                            Written discharge instructions were provided to the                            patient.                           - Resume previous diet.                           - Continue present medications.                           - Await pathology results.                           - Repeat colonoscopy is recommended for                            surveillance. The colonoscopy date will be  determined after pathology results from today's                            exam become available for review. Procedure Code(s):        --- Professional ---                           [phone], Colonoscopy, flexible; with removal of                            tumor(s), polyp(s), or other lesion(s) by snare                            technique                           [id number], 74, Colonoscopy, flexible; with biopsy,                            single or multiple CPT copyright 2016 American Medical Association. All rights reserved. Lajuan Lines. Hilarie Fredrickson, MD Jerene Bears, MD 05/06/2015 3:30:35 PM This report has been signed electronically. Number of Addenda: 0

## 2015-05-06 NOTE — Patient Instructions (Signed)
YOU HAD AN ENDOSCOPIC PROCEDURE TODAY AT [facility]:   Refer to the procedure report that was given to you for any specific questions about what was found during the examination.  If the procedure report does not answer your questions, please call your gastroenterologist to clarify.  If you requested that your care partner not be given the details of your procedure findings, then the procedure report has been included in a sealed envelope for you to review at your convenience later.  YOU SHOULD EXPECT: Some feelings of bloating in the abdomen. Passage of more gas than usual.  Walking can help get rid of the air that was put into your GI tract during the procedure and reduce the bloating. If you had a lower endoscopy (such as a colonoscopy or flexible sigmoidoscopy) you may notice spotting of blood in your stool or on the toilet paper. If you underwent a bowel prep for your procedure, you may not have a normal bowel movement for a few days.  Please Note:  You might notice some irritation and congestion in your nose or some drainage.  This is from the oxygen used during your procedure.  There is no need for concern and it should clear up in a day or so.  SYMPTOMS TO REPORT IMMEDIATELY:   Following lower endoscopy (colonoscopy or flexible sigmoidoscopy):  Excessive amounts of blood in the stool  Significant tenderness or worsening of abdominal pains  Swelling of the abdomen that is new, acute  Fever of 100F or higher   For urgent or emergent issues, a gastroenterologist can be reached at any hour by calling [phone].   DIET: Your first meal following the procedure should be a small meal and then it is ok to progress to your normal diet. Heavy or fried foods are harder to digest and may make you feel nauseous or bloated.  Likewise, meals heavy in dairy and vegetables can increase bloating.  Drink plenty of fluids but you should avoid alcoholic beverages for 24  hours.  ACTIVITY:  You should plan to take it easy for the rest of today and you should NOT DRIVE or use heavy machinery until tomorrow (because of the sedation medicines used during the test).    FOLLOW UP: Our staff will call the number listed on your records the next business day following your procedure to check on you and address any questions or concerns that you may have regarding the information given to you following your procedure. If we do not reach you, we will leave a message.  However, if you are feeling well and you are not experiencing any problems, there is no need to return our call.  We will assume that you have returned to your regular daily activities without incident.  If any biopsies were taken you will be contacted by phone or by letter within the next 1-3 weeks.  Please call us at [phone] if you have not heard about the biopsies in 3 weeks.    SIGNATURES/CONFIDENTIALITY: You and/or your care partner have signed paperwork which will be entered into your electronic medical record.  These signatures attest to the fact that that the information above on your After Visit Summary has been reviewed and is understood.  Full responsibility of the confidentiality of this discharge information lies with you and/or your care-partner.  Please review polyp, diverticulosis, and high fiber diet handouts provided. Next colonoscopy determined by pathology results.

## 2015-05-06 NOTE — Progress Notes (Signed)
A/ox3, pleased with MAC, report to RN 

## 2015-05-07 ENCOUNTER — Telehealth: Payer: Self-pay | Admitting: *Deleted

## 2015-05-07 NOTE — Telephone Encounter (Signed)
No answer, left message to call if questions or concerns. 

## 2015-05-11 DIAGNOSIS — M5020 Other cervical disc displacement, unspecified cervical region: Secondary | ICD-10-CM | POA: Diagnosis not present

## 2015-05-11 DIAGNOSIS — I10 Essential (primary) hypertension: Secondary | ICD-10-CM | POA: Diagnosis not present

## 2015-05-11 DIAGNOSIS — M47816 Spondylosis without myelopathy or radiculopathy, lumbar region: Secondary | ICD-10-CM | POA: Diagnosis not present

## 2015-05-11 DIAGNOSIS — M4302 Spondylolysis, cervical region: Secondary | ICD-10-CM | POA: Diagnosis not present

## 2015-05-12 ENCOUNTER — Encounter: Payer: Self-pay | Admitting: Internal Medicine

## 2015-05-13 DIAGNOSIS — E230 Hypopituitarism: Secondary | ICD-10-CM | POA: Diagnosis not present

## 2015-05-18 ENCOUNTER — Other Ambulatory Visit: Payer: Self-pay | Admitting: Neurosurgery

## 2015-05-18 ENCOUNTER — Telehealth: Payer: Self-pay | Admitting: Internal Medicine

## 2015-05-18 DIAGNOSIS — M4716 Other spondylosis with myelopathy, lumbar region: Secondary | ICD-10-CM

## 2015-05-18 NOTE — Telephone Encounter (Signed)
Have left a voicemail for Stephen Herrera with the CPT for PET scan and drug code for Gallium 68 dotatate. I have asked that he call back to confirm he received the information.

## 2015-05-19 NOTE — Telephone Encounter (Signed)
I have received a call from [place] at [facility] who requests records again that were previously sent. I have sent the original 49 pages plus the colonoscopy that was done since original records were sent. She will let me know if for some reason she does not receive them.

## 2015-05-21 ENCOUNTER — Other Ambulatory Visit: Payer: Medicare HMO

## 2015-05-22 ENCOUNTER — Ambulatory Visit
Admission: RE | Admit: 2015-05-22 | Discharge: 2015-05-22 | Disposition: A | Discharge status: Home / Self Care | Payer: Medicare HMO | Source: Ambulatory Visit | Attending: Neurosurgery | Admitting: Neurosurgery

## 2015-05-22 DIAGNOSIS — M4716 Other spondylosis with myelopathy, lumbar region: Secondary | ICD-10-CM

## 2015-05-22 DIAGNOSIS — M5126 Other intervertebral disc displacement, lumbar region: Secondary | ICD-10-CM | POA: Diagnosis not present

## 2015-05-27 ENCOUNTER — Encounter (HOSPITAL_COMMUNITY): Payer: Self-pay

## 2015-05-27 ENCOUNTER — Emergency Department (HOSPITAL_COMMUNITY)
Admission: EM | Admit: 2015-05-27 | Discharge: 2015-05-28 | Disposition: A | Discharge status: Other Acute Inpatient Hospital | Payer: Medicare HMO | Attending: Emergency Medicine | Admitting: Emergency Medicine

## 2015-05-27 DIAGNOSIS — Z8739 Personal history of other diseases of the musculoskeletal system and connective tissue: Secondary | ICD-10-CM | POA: Insufficient documentation

## 2015-05-27 DIAGNOSIS — Z7982 Long term (current) use of aspirin: Secondary | ICD-10-CM | POA: Diagnosis not present

## 2015-05-27 DIAGNOSIS — Z8719 Personal history of other diseases of the digestive system: Secondary | ICD-10-CM | POA: Insufficient documentation

## 2015-05-27 DIAGNOSIS — F411 Generalized anxiety disorder: Secondary | ICD-10-CM | POA: Diagnosis not present

## 2015-05-27 DIAGNOSIS — F1310 Sedative, hypnotic or anxiolytic abuse, uncomplicated: Secondary | ICD-10-CM | POA: Insufficient documentation

## 2015-05-27 DIAGNOSIS — Z86010 Personal history of colonic polyps: Secondary | ICD-10-CM | POA: Diagnosis not present

## 2015-05-27 DIAGNOSIS — F1210 Cannabis abuse, uncomplicated: Secondary | ICD-10-CM | POA: Insufficient documentation

## 2015-05-27 DIAGNOSIS — I2510 Atherosclerotic heart disease of native coronary artery without angina pectoris: Secondary | ICD-10-CM | POA: Insufficient documentation

## 2015-05-27 DIAGNOSIS — F419 Anxiety disorder, unspecified: Secondary | ICD-10-CM | POA: Diagnosis not present

## 2015-05-27 DIAGNOSIS — E785 Hyperlipidemia, unspecified: Secondary | ICD-10-CM | POA: Diagnosis not present

## 2015-05-27 DIAGNOSIS — Z87891 Personal history of nicotine dependence: Secondary | ICD-10-CM | POA: Diagnosis not present

## 2015-05-27 DIAGNOSIS — Z8781 Personal history of (healed) traumatic fracture: Secondary | ICD-10-CM | POA: Insufficient documentation

## 2015-05-27 DIAGNOSIS — Z7984 Long term (current) use of oral hypoglycemic drugs: Secondary | ICD-10-CM | POA: Diagnosis not present

## 2015-05-27 DIAGNOSIS — R69 Illness, unspecified: Secondary | ICD-10-CM | POA: Diagnosis not present

## 2015-05-27 DIAGNOSIS — G8929 Other chronic pain: Secondary | ICD-10-CM | POA: Diagnosis not present

## 2015-05-27 DIAGNOSIS — E119 Type 2 diabetes mellitus without complications: Secondary | ICD-10-CM | POA: Insufficient documentation

## 2015-05-27 DIAGNOSIS — Z79899 Other long term (current) drug therapy: Secondary | ICD-10-CM | POA: Insufficient documentation

## 2015-05-27 DIAGNOSIS — Z794 Long term (current) use of insulin: Secondary | ICD-10-CM | POA: Insufficient documentation

## 2015-05-27 DIAGNOSIS — R45851 Suicidal ideations: Secondary | ICD-10-CM

## 2015-05-27 DIAGNOSIS — R42 Dizziness and giddiness: Secondary | ICD-10-CM | POA: Diagnosis not present

## 2015-05-27 DIAGNOSIS — J449 Chronic obstructive pulmonary disease, unspecified: Secondary | ICD-10-CM | POA: Diagnosis not present

## 2015-05-27 DIAGNOSIS — F329 Major depressive disorder, single episode, unspecified: Secondary | ICD-10-CM | POA: Diagnosis not present

## 2015-05-27 DIAGNOSIS — R55 Syncope and collapse: Secondary | ICD-10-CM | POA: Diagnosis present

## 2015-05-27 DIAGNOSIS — R404 Transient alteration of awareness: Secondary | ICD-10-CM | POA: Diagnosis not present

## 2015-05-27 LAB — URINALYSIS, ROUTINE W REFLEX MICROSCOPIC
Bilirubin Urine: NEGATIVE
Glucose, UA: >1000 mg/dL — AB
Hgb urine dipstick: NEGATIVE
Ketones, ur: NEGATIVE mg/dL
Leukocytes, UA: NEGATIVE
Nitrite: NEGATIVE
Protein, ur: NEGATIVE mg/dL
Specific Gravity, Urine: 1.037 — ABNORMAL HIGH (ref 1.005–1.030)
pH: 5.5 (ref 5.0–8.0)

## 2015-05-27 LAB — ETHANOL: Alcohol, Ethyl (B): <5 mg/dL (ref <5)

## 2015-05-27 LAB — CBC WITH DIFFERENTIAL/PLATELET
Basophils Absolute: 0 10*3/uL (ref 0.0–0.1)
Basophils Relative: 0 %
Eosinophils Absolute: 0 10*3/uL (ref 0.0–0.7)
Eosinophils Relative: 0 %
HCT: 52.4 % — ABNORMAL HIGH (ref 39.0–52.0)
Hemoglobin: 18.5 g/dL — ABNORMAL HIGH (ref 13.0–17.0)
Lymphocytes Relative: 16 %
Lymphs Abs: 1.8 10*3/uL (ref 0.7–4.0)
MCH: 30.3 pg (ref 26.0–34.0)
MCHC: 35.3 g/dL (ref 30.0–36.0)
MCV: 85.9 fL (ref 78.0–100.0)
Monocytes Absolute: 0.8 10*3/uL (ref 0.1–1.0)
Monocytes Relative: 7 %
Neutro Abs: 8.6 10*3/uL — ABNORMAL HIGH (ref 1.7–7.7)
Neutrophils Relative %: 77 %
Platelets: 293 10*3/uL (ref 150–400)
RBC: 6.1 MIL/uL — ABNORMAL HIGH (ref 4.22–5.81)
RDW: 13.5 % (ref 11.5–15.5)
WBC: 11.3 10*3/uL — ABNORMAL HIGH (ref 4.0–10.5)

## 2015-05-27 LAB — COMPREHENSIVE METABOLIC PANEL
ALT: 30 U/L (ref 17–63)
AST: 34 U/L (ref 15–41)
Albumin: 4.9 g/dL (ref 3.5–5.0)
Alkaline Phosphatase: 73 U/L (ref 38–126)
Anion gap: 13 (ref 5–15)
BUN: 22 mg/dL — ABNORMAL HIGH (ref 6–20)
CO2: 22 mmol/L (ref 22–32)
Calcium: 9.7 mg/dL (ref 8.9–10.3)
Chloride: 106 mmol/L (ref 101–111)
Creatinine, Ser: 0.8 mg/dL (ref 0.61–1.24)
GFR calc Af Amer: >60 mL/min (ref >60)
GFR calc non Af Amer: >60 mL/min (ref >60)
Glucose, Bld: 127 mg/dL — ABNORMAL HIGH (ref 65–99)
Potassium: 3.8 mmol/L (ref 3.5–5.1)
Sodium: 141 mmol/L (ref 135–145)
Total Bilirubin: 0.8 mg/dL (ref 0.3–1.2)
Total Protein: 8.3 g/dL — ABNORMAL HIGH (ref 6.5–8.1)

## 2015-05-27 LAB — RAPID URINE DRUG SCREEN, HOSP PERFORMED
Amphetamines: NOT DETECTED
Barbiturates: NOT DETECTED
Benzodiazepines: POSITIVE — AB
Cocaine: NOT DETECTED
Opiates: NOT DETECTED
Tetrahydrocannabinol: POSITIVE — AB

## 2015-05-27 LAB — URINE MICROSCOPIC-ADD ON
Bacteria, UA: NONE SEEN
RBC / HPF: NONE SEEN RBC/hpf (ref 0–5)
Squamous Epithelial / LPF: NONE SEEN
WBC, UA: NONE SEEN WBC/hpf (ref 0–5)

## 2015-05-27 LAB — CBG MONITORING, ED: Glucose-Capillary: 141 mg/dL — ABNORMAL HIGH (ref 65–99)

## 2015-05-27 MED ORDER — INSULIN ASPART PROT & ASPART (70-30 MIX) 100 UNIT/ML ~~LOC~~ SUSP
100.0000 [IU] | Freq: Two times a day (BID) | SUBCUTANEOUS | Status: DC
  Filled 2015-05-27: qty 10

## 2015-05-27 MED ORDER — METFORMIN HCL ER 500 MG PO TB24
500.0000 mg | ORAL_TABLET | Freq: Every day | ORAL | Status: DC
  Filled 2015-05-27 (×2): qty 1

## 2015-05-27 MED ORDER — SERTRALINE HCL 50 MG PO TABS
100.0000 mg | ORAL_TABLET | Freq: Every day | ORAL | Status: DC
  Administered 2015-05-28: 100 mg via ORAL
  Filled 2015-05-27: qty 2

## 2015-05-27 MED ORDER — DIAZEPAM 5 MG PO TABS
5.0000 mg | ORAL_TABLET | Freq: Once | ORAL | Status: AC
  Administered 2015-05-27: 5 mg via ORAL
  Filled 2015-05-27: qty 1

## 2015-05-27 MED ORDER — GABAPENTIN 300 MG PO CAPS
300.0000 mg | ORAL_CAPSULE | Freq: Every day | ORAL | Status: DC
  Administered 2015-05-28: 300 mg via ORAL
  Filled 2015-05-27: qty 1

## 2015-05-27 MED ORDER — HYDROCODONE-ACETAMINOPHEN 10-325 MG PO TABS
1.0000 | ORAL_TABLET | Freq: Four times a day (QID) | ORAL | Status: DC | PRN
  Administered 2015-05-27 – 2015-05-28 (×3): 1 via ORAL
  Filled 2015-05-27 (×3): qty 1

## 2015-05-27 MED ORDER — CANAGLIFLOZIN 100 MG PO TABS
100.0000 mg | ORAL_TABLET | Freq: Every day | ORAL | Status: DC
  Administered 2015-05-28: 100 mg via ORAL
  Filled 2015-05-27 (×2): qty 1

## 2015-05-27 MED ORDER — OXYCODONE-ACETAMINOPHEN 5-325 MG PO TABS
2.0000 | ORAL_TABLET | Freq: Once | ORAL | Status: AC
  Administered 2015-05-27: 2 via ORAL
  Filled 2015-05-27: qty 2

## 2015-05-27 MED ORDER — SODIUM CHLORIDE 0.9 % IV BOLUS (SEPSIS)
1000.0000 mL | Freq: Once | INTRAVENOUS | Status: AC
  Administered 2015-05-27: 1000 mL via INTRAVENOUS

## 2015-05-27 MED ORDER — ASPIRIN EC 81 MG PO TBEC
81.0000 mg | DELAYED_RELEASE_TABLET | Freq: Every day | ORAL | Status: DC
Start: 2015-05-28 — End: 2015-05-28
  Administered 2015-05-28: 81 mg via ORAL
  Filled 2015-05-27: qty 1

## 2015-05-27 MED ORDER — SODIUM CHLORIDE 0.9 % IV BOLUS (SEPSIS)
2000.0000 mL | Freq: Once | INTRAVENOUS | Status: AC
  Administered 2015-05-27: 2000 mL via INTRAVENOUS

## 2015-05-27 MED ORDER — RISAQUAD PO CAPS
ORAL_CAPSULE | Freq: Every day | ORAL | Status: DC
  Administered 2015-05-28: 1 via ORAL
  Filled 2015-05-27: qty 1

## 2015-05-27 MED ORDER — ALUM & MAG HYDROXIDE-SIMETH 200-200-20 MG/5ML PO SUSP: 30.0000 mL | ORAL | Status: DC | PRN

## 2015-05-27 MED ORDER — ZOLPIDEM TARTRATE 5 MG PO TABS: 5.0000 mg | ORAL_TABLET | Freq: Every evening | ORAL | Status: DC | PRN

## 2015-05-27 MED ORDER — IBUPROFEN 200 MG PO TABS
600.0000 mg | ORAL_TABLET | Freq: Three times a day (TID) | ORAL | Status: DC | PRN
  Administered 2015-05-28: 600 mg via ORAL
  Filled 2015-05-27: qty 3

## 2015-05-27 MED ORDER — ATORVASTATIN CALCIUM 40 MG PO TABS
40.0000 mg | ORAL_TABLET | Freq: Every day | ORAL | Status: DC
  Administered 2015-05-27: 40 mg via ORAL
  Filled 2015-05-27 (×2): qty 1

## 2015-05-27 MED ORDER — ONDANSETRON HCL 4 MG PO TABS
4.0000 mg | ORAL_TABLET | Freq: Three times a day (TID) | ORAL | Status: DC | PRN
  Administered 2015-05-28: 4 mg via ORAL
  Filled 2015-05-27: qty 1

## 2015-05-27 NOTE — ED Notes (Signed)
Pt aware that a urine sample is needed. Pt sts he will ring call bell after he is able to use the urinal to give sample
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
## 2015-05-27 NOTE — Progress Notes (Signed)
   05/27/15 1200  Clinical Encounter Type  Visited With Patient  Visit Type Follow-up;Psychological support;Spiritual support;ED  Referral From Patient  Consult/Referral To Chaplain  Spiritual Encounters  Spiritual Needs Emotional;Other (Comment) (Pastoral Support)  Stress Factors  Patient Stress Factors Other (Comment)   The patient had previously requested to speak with Spiritual care Director, Jeanella Craze. I received permission from the nurse to take a hospital phone into the room so that the patient could speak with Jeanella Craze over the phone.  This was completed.   [place] M.Div.

## 2015-05-27 NOTE — ED Notes (Signed)
Bed: [id number] Expected date:  Expected time:  Means of arrival:  Comments: EMS- anxiety
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
## 2015-05-27 NOTE — ED Notes (Signed)
Pt Asked to change into scrubs. Pt belongings locked in locker see note.  Meal tray delivered to bedside. Girl friend at bedside and made aware of TCU rules. Both patient and visitor understands.

## 2015-05-27 NOTE — ED Notes (Signed)
Per EMS, they were called to the doctors office for a syncopal episode. Per physician, episode was anxiety driven. According to physician, pt has cancer that they are not able to find and this is making patient anxious. Pt passed out en route, fell to the ground, hit his head against the wall.

## 2015-05-27 NOTE — ED Notes (Signed)
TTS at bedside. 

## 2015-05-27 NOTE — ED Notes (Signed)
Per paperwork from the doctors office that pt just left, "last night he held a gun to his mouth and contemplated suicide. He was able to resist in today he is extremely agitated and anxious".

## 2015-05-27 NOTE — ED Notes (Signed)
Pt came to nurses station stating "I need to check my sugar here in a few, I need to know how much insulin I need to take that last time ya'll had me here you almost threw me into hyperglycemia. This RN will get a Pharmacy Tech to rectify medication list and then request meds and cbg testing from EDP.

## 2015-05-27 NOTE — Progress Notes (Signed)
   05/27/15 1100  Clinical Encounter Type  Visited With Patient  Visit Type Initial;Psychological support;Spiritual support;ED  Referral From Nurse  Consult/Referral To Chaplain  Spiritual Encounters  Spiritual Needs Emotional;Other (Comment) (Pastoral Conversation/Support)  Stress Factors  Patient Stress Factors Health changes;Lack of knowledge;Other (Comment);Loss of control (Mental Health Issues)   I was referred to the patient by the nurse who stated that the patient was extremely anxious and dealing with psychological issues.  When I visited with the patient who was tearful and visibly anxious. He stated that he has spoken with the Director of [facility], Jeanella Craze, in the past about his health concerns, issues with the hospital system and his anxiety.  The patient stated that he has cancer, but that the doctors haven't been able to find out where it is. He also states that due to insurance, he is not eligible for further testing.  Pt. States that he has been seen at the cancer center in the past and has had multiple hospital visits.  Mr. Santy states that his fiance of 73 years, Jocelyn Lamer, is his support system and is on her way to the hospital to see him.  Patient asked why there were so many security officers outside of his room door. Which we were able to explain to him. Patient said that he wouldn't hurt anybody.  I contacted Jeanella Craze with the permission of the patient and Jeanella Craze is going to try and talk with the patient via phone or visit him in person.  Follow-up is required.  Please contact Spiritual Care for further assistance.    [place] M.Div.

## 2015-05-27 NOTE — BH Assessment (Signed)
Assessment completed. Consulted Patriciaann Clan, PA-C who recommended inpatient treatment. Pt would be appropriate for a 400 hall bed. TTS to seek placement.
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
## 2015-05-28 ENCOUNTER — Encounter (HOSPITAL_COMMUNITY): Payer: Self-pay | Admitting: *Deleted

## 2015-05-28 ENCOUNTER — Inpatient Hospital Stay (HOSPITAL_COMMUNITY)
Admission: AD | Admit: 2015-05-28 | Discharge: 2015-06-04 | DRG: 885 | Disposition: A | Discharge status: Home / Self Care | Payer: Medicare HMO | Source: Intra-hospital | Attending: Psychiatry | Admitting: Psychiatry

## 2015-05-28 DIAGNOSIS — F410 Panic disorder [episodic paroxysmal anxiety] without agoraphobia: Secondary | ICD-10-CM | POA: Diagnosis present

## 2015-05-28 DIAGNOSIS — F419 Anxiety disorder, unspecified: Secondary | ICD-10-CM | POA: Diagnosis not present

## 2015-05-28 DIAGNOSIS — E119 Type 2 diabetes mellitus without complications: Secondary | ICD-10-CM | POA: Diagnosis not present

## 2015-05-28 DIAGNOSIS — F332 Major depressive disorder, recurrent severe without psychotic features: Secondary | ICD-10-CM | POA: Diagnosis present

## 2015-05-28 DIAGNOSIS — F411 Generalized anxiety disorder: Secondary | ICD-10-CM | POA: Diagnosis present

## 2015-05-28 DIAGNOSIS — E039 Hypothyroidism, unspecified: Secondary | ICD-10-CM | POA: Diagnosis present

## 2015-05-28 DIAGNOSIS — K5903 Drug induced constipation: Secondary | ICD-10-CM | POA: Diagnosis present

## 2015-05-28 DIAGNOSIS — T40605A Adverse effect of unspecified narcotics, initial encounter: Secondary | ICD-10-CM | POA: Diagnosis present

## 2015-05-28 DIAGNOSIS — Z87891 Personal history of nicotine dependence: Secondary | ICD-10-CM | POA: Diagnosis not present

## 2015-05-28 DIAGNOSIS — Z7982 Long term (current) use of aspirin: Secondary | ICD-10-CM | POA: Diagnosis not present

## 2015-05-28 DIAGNOSIS — E860 Dehydration: Secondary | ICD-10-CM | POA: Diagnosis present

## 2015-05-28 DIAGNOSIS — Z8249 Family history of ischemic heart disease and other diseases of the circulatory system: Secondary | ICD-10-CM

## 2015-05-28 DIAGNOSIS — R45851 Suicidal ideations: Secondary | ICD-10-CM | POA: Diagnosis present

## 2015-05-28 DIAGNOSIS — D751 Secondary polycythemia: Secondary | ICD-10-CM | POA: Diagnosis present

## 2015-05-28 DIAGNOSIS — R5382 Chronic fatigue, unspecified: Secondary | ICD-10-CM | POA: Diagnosis present

## 2015-05-28 DIAGNOSIS — Z8042 Family history of malignant neoplasm of prostate: Secondary | ICD-10-CM

## 2015-05-28 DIAGNOSIS — Z801 Family history of malignant neoplasm of trachea, bronchus and lung: Secondary | ICD-10-CM | POA: Diagnosis not present

## 2015-05-28 DIAGNOSIS — G8929 Other chronic pain: Secondary | ICD-10-CM | POA: Diagnosis present

## 2015-05-28 DIAGNOSIS — Z794 Long term (current) use of insulin: Secondary | ICD-10-CM

## 2015-05-28 DIAGNOSIS — E109 Type 1 diabetes mellitus without complications: Secondary | ICD-10-CM | POA: Diagnosis not present

## 2015-05-28 DIAGNOSIS — Z833 Family history of diabetes mellitus: Secondary | ICD-10-CM | POA: Diagnosis not present

## 2015-05-28 DIAGNOSIS — E785 Hyperlipidemia, unspecified: Secondary | ICD-10-CM | POA: Diagnosis present

## 2015-05-28 DIAGNOSIS — R69 Illness, unspecified: Secondary | ICD-10-CM | POA: Diagnosis not present

## 2015-05-28 DIAGNOSIS — D45 Polycythemia vera: Secondary | ICD-10-CM | POA: Diagnosis not present

## 2015-05-28 LAB — GLUCOSE, CAPILLARY
GLUCOSE-CAPILLARY: 100 mg/dL — AB (ref 65–99)
Glucose-Capillary: 160 mg/dL — ABNORMAL HIGH (ref 65–99)
Glucose-Capillary: 195 mg/dL — ABNORMAL HIGH (ref 65–99)

## 2015-05-28 LAB — CBG MONITORING, ED: GLUCOSE-CAPILLARY: 127 mg/dL — AB (ref 65–99)

## 2015-05-28 MED ORDER — METFORMIN HCL ER 500 MG PO TB24
500.0000 mg | ORAL_TABLET | Freq: Every day | ORAL | Status: DC
  Filled 2015-05-28 (×2): qty 1

## 2015-05-28 MED ORDER — MAGNESIUM CITRATE PO SOLN
1.0000 | Freq: Once | ORAL | Status: AC
  Administered 2015-05-28: 1 via ORAL
  Filled 2015-05-28: qty 296

## 2015-05-28 MED ORDER — LORAZEPAM 0.5 MG PO TABS
0.5000 mg | ORAL_TABLET | Freq: Two times a day (BID) | ORAL | Status: DC
  Administered 2015-05-28 – 2015-06-02 (×10): 0.5 mg via ORAL
  Filled 2015-05-28 (×10): qty 1

## 2015-05-28 MED ORDER — METFORMIN HCL ER 500 MG PO TB24
500.0000 mg | ORAL_TABLET | Freq: Every day | ORAL | Status: DC
  Administered 2015-05-31 – 2015-06-03 (×2): 500 mg via ORAL
  Filled 2015-05-28 (×10): qty 1

## 2015-05-28 MED ORDER — GABAPENTIN 300 MG PO CAPS
300.0000 mg | ORAL_CAPSULE | Freq: Every day | ORAL | Status: DC
  Administered 2015-05-29: 300 mg via ORAL
  Filled 2015-05-28 (×4): qty 1

## 2015-05-28 MED ORDER — CANAGLIFLOZIN 100 MG PO TABS
100.0000 mg | ORAL_TABLET | Freq: Every day | ORAL | Status: DC
  Administered 2015-05-29 – 2015-06-04 (×7): 100 mg via ORAL
  Filled 2015-05-28 (×10): qty 1

## 2015-05-28 MED ORDER — ALUM & MAG HYDROXIDE-SIMETH 200-200-20 MG/5ML PO SUSP: 30.0000 mL | ORAL | Status: DC | PRN

## 2015-05-28 MED ORDER — LIDOCAINE 5 % EX PTCH
1.0000 | MEDICATED_PATCH | CUTANEOUS | Status: DC
  Administered 2015-05-29 – 2015-06-04 (×7): 1 via TRANSDERMAL
  Filled 2015-05-28 (×9): qty 1

## 2015-05-28 MED ORDER — PSYLLIUM 95 % PO PACK
1.0000 | PACK | Freq: Every day | ORAL | Status: DC
  Administered 2015-05-28: 1 via ORAL
  Filled 2015-05-28: qty 1

## 2015-05-28 MED ORDER — MAGNESIUM HYDROXIDE 400 MG/5ML PO SUSP: 30.0000 mL | Freq: Every day | ORAL | Status: DC | PRN

## 2015-05-28 MED ORDER — ACETAMINOPHEN 325 MG PO TABS
650.0000 mg | ORAL_TABLET | Freq: Four times a day (QID) | ORAL | Status: DC | PRN
  Administered 2015-06-01: 650 mg via ORAL
  Filled 2015-05-28: qty 2

## 2015-05-28 MED ORDER — INSULIN ASPART PROT & ASPART (70-30 MIX) 100 UNIT/ML ~~LOC~~ SUSP: 50.0000 [IU] | Freq: Every day | SUBCUTANEOUS | Status: DC

## 2015-05-28 MED ORDER — LIDOCAINE 5 % EX PTCH
1.0000 | MEDICATED_PATCH | CUTANEOUS | Status: DC
  Administered 2015-05-28: 1 via TRANSDERMAL
  Filled 2015-05-28 (×2): qty 1

## 2015-05-28 MED ORDER — ASPIRIN EC 81 MG PO TBEC
81.0000 mg | DELAYED_RELEASE_TABLET | Freq: Every day | ORAL | Status: DC
  Administered 2015-05-29 – 2015-06-04 (×7): 81 mg via ORAL
  Filled 2015-05-28 (×10): qty 1

## 2015-05-28 MED ORDER — INSULIN ASPART 100 UNIT/ML ~~LOC~~ SOLN: 0.0000 [IU] | Freq: Three times a day (TID) | SUBCUTANEOUS | Status: DC

## 2015-05-28 MED ORDER — HYDROCODONE-ACETAMINOPHEN 10-325 MG PO TABS
1.0000 | ORAL_TABLET | Freq: Four times a day (QID) | ORAL | Status: DC | PRN
  Administered 2015-05-28 – 2015-05-29 (×2): 1 via ORAL
  Filled 2015-05-28 (×2): qty 1

## 2015-05-28 MED ORDER — ATORVASTATIN CALCIUM 40 MG PO TABS
40.0000 mg | ORAL_TABLET | Freq: Every day | ORAL | Status: DC
  Administered 2015-05-28 – 2015-06-03 (×6): 40 mg via ORAL
  Filled 2015-05-28 (×10): qty 1

## 2015-05-28 MED ORDER — PSYLLIUM 95 % PO PACK
1.0000 | PACK | Freq: Every day | ORAL | Status: DC
  Administered 2015-05-29 – 2015-06-04 (×7): 1 via ORAL
  Filled 2015-05-28 (×9): qty 1

## 2015-05-28 MED ORDER — INSULIN ASPART PROT & ASPART (70-30 MIX) 100 UNIT/ML ~~LOC~~ SUSP: 100.0000 [IU] | Freq: Two times a day (BID) | SUBCUTANEOUS | Status: DC

## 2015-05-28 MED ORDER — LORAZEPAM 0.5 MG PO TABS
0.5000 mg | ORAL_TABLET | Freq: Two times a day (BID) | ORAL | Status: DC
  Administered 2015-05-28: 0.5 mg via ORAL
  Filled 2015-05-28: qty 1

## 2015-05-28 MED ORDER — SERTRALINE HCL 100 MG PO TABS
100.0000 mg | ORAL_TABLET | Freq: Every day | ORAL | Status: DC
  Administered 2015-05-29: 100 mg via ORAL
  Filled 2015-05-28 (×4): qty 1

## 2015-05-28 NOTE — Tx Team (Signed)
Initial Interdisciplinary Treatment Plan   PATIENT STRESSORS: Financial difficulties Health problems Legal issue   PATIENT STRENGTHS: Ability for insight Average or above average intelligence Capable of independent living Communication skills Financial means Religious Affiliation Supportive family/friends   PROBLEM LIST: Problem List/Patient Goals Date to be addressed Date deferred Reason deferred Estimated date of resolution  At risk for suicide 05/28/2015  05/28/2015   D/C  Anxiety 05/28/2015  05/28/2015   D/C  Depression 05/28/2015  05/28/2015   D/C  "One day without pain" 05/28/2015  05/28/2015   D/C  "Calm down, be able to relax" 05/28/2015  05/28/2015   D/C                           DISCHARGE CRITERIA:  Improved stabilization in mood, thinking, and/or behavior Medical problems require only outpatient monitoring Motivation to continue treatment in a less acute level of care Need for constant or close observation no longer present Reduction of life-threatening or endangering symptoms to within safe limits Verbal commitment to aftercare and medication compliance  PRELIMINARY DISCHARGE PLAN: Outpatient therapy Return to previous living arrangement Return to previous work or school arrangements  PATIENT/FAMIILY INVOLVEMENT: This treatment plan has been presented to and reviewed with the patient, Stephen Herrera.  The patient and family have been given the opportunity to ask questions and make suggestions.  Donne Hazel P 05/28/2015, 4:51 PM
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
## 2015-05-28 NOTE — BH Assessment (Signed)
[facility] Assessment Progress Note  Per Corena Pilgrim, MD, this pt requires psychiatric hospitalization at this time.  Debarah Crape, RN, [facility] has assigned pt to [facility] Rm 403-2.  Pt presents under IVC initiated by EDP Orlie Dakin, MD; IVC documents have been faxed to [facility].  Pt's nurse, Marcie Bal, has been notified, and agrees to call report to [phone].  Pt is to be transported via Event organ[facility].  Jalene Mullet, [facility] Triage Specialist [phone]
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
## 2015-05-28 NOTE — Progress Notes (Signed)
Adult Psychoeducational Group Note  Date:  05/28/2015 Time:  9:13 PM  Group Topic/Focus:  Wrap-Up Group:   The focus of this group is to help patients review their daily goal of treatment and discuss progress on daily workbooks.  Participation Level:  Did Not Attend  Participation Quality:  Did not attend  Affect:  Did not attend  Cognitive:  Did not attend  Insight: None  Engagement in Group:  Did not attend  Modes of Intervention:  Did not attend  Additional Comments:  Patient did not attend wrap up group tonight.   Marte Celani L Errin Chewning 05/28/2015, 9:13 PM
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
## 2015-05-29 ENCOUNTER — Encounter (HOSPITAL_COMMUNITY): Payer: Self-pay | Admitting: Psychiatry

## 2015-05-29 DIAGNOSIS — F332 Major depressive disorder, recurrent severe without psychotic features: Principal | ICD-10-CM

## 2015-05-29 DIAGNOSIS — D45 Polycythemia vera: Secondary | ICD-10-CM

## 2015-05-29 DIAGNOSIS — E860 Dehydration: Secondary | ICD-10-CM

## 2015-05-29 DIAGNOSIS — K5900 Constipation, unspecified: Secondary | ICD-10-CM

## 2015-05-29 DIAGNOSIS — F411 Generalized anxiety disorder: Secondary | ICD-10-CM

## 2015-05-29 DIAGNOSIS — E109 Type 1 diabetes mellitus without complications: Secondary | ICD-10-CM

## 2015-05-29 LAB — GLUCOSE, CAPILLARY
GLUCOSE-CAPILLARY: 117 mg/dL — AB (ref 65–99)
GLUCOSE-CAPILLARY: 147 mg/dL — AB (ref 65–99)
GLUCOSE-CAPILLARY: 158 mg/dL — AB (ref 65–99)
Glucose-Capillary: 126 mg/dL — ABNORMAL HIGH (ref 65–99)

## 2015-05-29 LAB — CBG MONITORING, ED: GLUCOSE-CAPILLARY: 171 mg/dL — AB (ref 65–99)

## 2015-05-29 MED ORDER — ENSURE ENLIVE PO LIQD: 237.0000 mL | Freq: Two times a day (BID) | ORAL | Status: DC

## 2015-05-29 MED ORDER — GABAPENTIN 100 MG PO CAPS
200.0000 mg | ORAL_CAPSULE | Freq: Two times a day (BID) | ORAL | Status: DC
  Administered 2015-05-29: 200 mg via ORAL
  Administered 2015-05-30: 100 mg via ORAL
  Administered 2015-05-30: 200 mg via ORAL
  Filled 2015-05-29 (×8): qty 2

## 2015-05-29 MED ORDER — HYDROCODONE-ACETAMINOPHEN 10-325 MG PO TABS
1.0000 | ORAL_TABLET | Freq: Two times a day (BID) | ORAL | Status: DC | PRN
  Administered 2015-05-29 – 2015-05-31 (×4): 1 via ORAL
  Filled 2015-05-29 (×4): qty 1

## 2015-05-29 MED ORDER — GLUCERNA SHAKE PO LIQD
237.0000 mL | Freq: Two times a day (BID) | ORAL | Status: DC
  Administered 2015-05-29 – 2015-05-31 (×4): 237 mL via ORAL

## 2015-05-29 MED ORDER — POLYETHYLENE GLYCOL 3350 17 G PO PACK: 17.0000 g | PACK | Freq: Every day | ORAL | Status: DC | PRN

## 2015-05-29 MED ORDER — SERTRALINE HCL 50 MG PO TABS
150.0000 mg | ORAL_TABLET | Freq: Every day | ORAL | Status: DC
  Administered 2015-05-30 – 2015-06-01 (×3): 150 mg via ORAL
  Filled 2015-05-29 (×5): qty 3

## 2015-05-29 NOTE — BHH Group Notes (Signed)
[place] LCSW Group Therapy 05/29/2015 1:15pm  Type of Therapy: Group Therapy- Feelings Around Relapse and Recovery  Participation Level: Active   Participation Quality:  Appropriate  Affect:  Appropriate  Cognitive: Alert and Oriented   Insight:  Developing   Engagement in Therapy: Developing/Improving and Engaged   Modes of Intervention: Clarification, Confrontation, Discussion, Education, Exploration, Limit-setting, Orientation, Problem-solving, Rapport Building, Art therapist, Socialization and Support  Summary of Progress/Problems: The topic for today was feelings about relapse. The group discussed what relapse prevention is to them and identified triggers that they are on the path to relapse. Members also processed their feeling towards relapse and were able to relate to common experiences. Group also discussed coping skills that can be used for relapse prevention. Pt identified triggers for relapse such as systematic failures in treatment of his cancer. Pt was focused on his physical health and the hopelessness he feels regarding this situation. He was able to identify the use of positive self-talk as beneficial in helping him cope with feelings of hopelessness.   Therapeutic Modalities:   Cognitive Behavioral Therapy Solution-Focused Therapy Assertiveness Training Relapse Prevention Therapy    Norman Clay [id number] 05/29/2015 4:15 PM
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
## 2015-05-29 NOTE — BHH Group Notes (Signed)
[facility] LCSW Aftercare Discharge Planning Group Note  05/29/2015 8:45 AM  Pt did not attend, declined invitation.   Peri Maris, [place] 05/29/2015 9:48 AM

## 2015-05-29 NOTE — Progress Notes (Signed)
Pt is new to the unit earlier today.  He reports that he has chronic back pain and is asking about a pillow cushion that was brought in by his family.  He said he cannot sit long without it.  He contracts for safety.  He denies HI/AVH.  Writer reviewed his evening medications with him.  He was encouraged to make his needs known to staff.  Pt's belongings were searched and taken to him.  Support and encouragement offered.  Discharge plans are in process.  Safety maintained with q15 minute checks.
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
## 2015-05-29 NOTE — Tx Team (Signed)
Interdisciplinary Treatment Plan Update (Adult) Date: 05/29/2015   Date: 05/29/2015 4:25 PM  Progress in Treatment:  Attending groups: Yes  Participating in groups: Yes  Taking medication as prescribed: Yes  Tolerating medication: Yes  Family/Significant othe contact made: No, CSW attempting to make contact with fiance Patient understands diagnosis: Yes AEB seeking help with depression Discussing patient identified problems/goals with staff: Yes  Medical problems stabilized or resolved: Yes  Denies suicidal/homicidal ideation: Yes Patient has not harmed self or Others: Yes   New problem(s) identified: None identified at this time.   Discharge Plan or Barriers: Pt will return home and follow-up with outpatient providers  Additional comments:  Patient and CSW reviewed pt's identified goals and treatment plan. Patient verbalized understanding and agreed to treatment plan. CSW reviewed [facility] "Discharge Process and Patient Involvement" Form. Pt verbalized understanding of information provided and signed form.   Reason for Continuation of Hospitalization:  Anxiety Depression Medication stabilization Suicidal ideation  Estimated length of stay: 3-5 days  Review of initial/current patient goals per problem list:   1.  Goal(s): Patient will participate in aftercare plan  Met:  Yes  Target date: 3-5 days from date of admission   As evidenced by: Patient will participate within aftercare plan AEB aftercare provider and housing plan at discharge being identified.   05/29/15: Pt will return home and follow-up with outpatient providers  2.  Goal (s): Patient will exhibit decreased depressive symptoms and suicidal ideations.  Met:  No  Target date: 3-5 days from date of admission   As evidenced by: Patient will utilize self rating of depression at 3 or below and demonstrate decreased signs of depression or be deemed stable for discharge by MD.  05/29/15: Pt rates depression at 10/10; denies  SI  3.  Goal(s): Patient will demonstrate decreased signs and symptoms of anxiety.  Met:  No  Target date: 3-5 days from date of admission   As evidenced by: Patient will utilize self rating of anxiety at 3 or below and demonstrated decreased signs of anxiety, or be deemed stable for discharge by MD  05/29/15: Pt rates anxiety at 10/10  Attendees:  Patient:    Family:    Physician: Dr. Parke Poisson, MD  05/29/2015 4:25 PM  Nursing: Lars Pinks, RN Case manager  05/29/2015 4:25 PM  Clinical Social Worker Peri Maris, [place] 05/29/2015 4:25 PM  Other: Tilden Fossa, [place] 05/29/2015 4:25 PM  Clinical: Sandre Kitty, Desma Paganini  RN 05/29/2015 4:25 PM  Other: , RN Charge Nurse 05/29/2015 4:25 PM  Other: Hilda Lias, [place], [place] Work [phone]

## 2015-05-29 NOTE — [facility] Suicide Risk Assessment (Addendum)
[facility] Admission Suicide Risk Assessment   Nursing information obtained from:  Patient Demographic factors:  Male, Caucasian Current Mental Status:  Suicidal ideation indicated by patient, Self-harm thoughts, Self-harm behaviors Loss Factors:  Loss of significant relationship, Decline in physical health, Legal issues, Financial problems / change in socioeconomic status Historical Factors:  NA Risk Reduction Factors:  Sense of responsibility to family, Religious beliefs about death, Living with another person, especially a relative, Positive social support  Total Time spent with patient: 45 minutes Principal Problem: Depression, anxiety secondary to Medical Illness  Diagnosis:   Patient Active Problem List   Diagnosis Date Noted  . GAD (generalized anxiety disorder) [F41.1] 05/28/2015  . MDD (major depressive disorder), recurrent episode, severe ([place]) [F33.2] 05/28/2015  . Suicidal ideation [R45.851]   . Polycythemia vera ([place]) [D45] 03/31/2015  . Carcinoid syndrome ([place]) [E34.0] 02/26/2015  . Diarrhea [R19.7] 02/26/2015  . Abdominal pain [R10.9] 09/16/2014  . Polycythemia [D75.1] 09/16/2014  . Chest pain [R07.9] 09/13/2014  . Erythrocytosis [D75.1] 09/13/2014  . Syncope [R55] 09/13/2014  . Hyperhidrosis [L74.519] 04/30/2014  . Benign positional vertigo [H81.10] 10/26/2011  . Rash [R21] 10/04/2011  . Night sweats [R61] 06/03/2011  . Anxiety [F41.9] 01/26/2011  . OSA (obstructive sleep apnea) [G47.33] 08/09/2010  . DM (diabetes mellitus) ([place]) [E11.9] 06/03/2010  . Preventative health care [Z00.00] 06/03/2010  . Urinary retention with incomplete bladder emptying [R33.9] 06/03/2010  . COPD (chronic obstructive pulmonary disease) ([place]) [J44.9]   . IBS (irritable bowel syndrome) [K58.9]   . Chronic fatigue syndrome [R53.82]   . Chronic pain [G89.29]   . ADD (attention deficit disorder) [F90.9]   . Gout [274]   . Hyperlipidemia [E78.5]   . CAD (coronary atherosclerotic disease)  [I25.10]   . Hypogonadism male [E29.1]   . Hypothyroidism [E03.9]   . Chest pain syndrome [R07.9]   . Allergic rhinitis, seasonal [J30.2]   . History of colonoscopy with polypectomy [Z98.890, Z86.010]      Continued Clinical Symptoms:  Alcohol Use Disorder Identification Test Final Score (AUDIT): 0 The "Alcohol Use Disorders Identification Test", Guidelines for Use in Primary Care, Second Edition.  World Pharmacologist [facility]). Score between 0-7:  no or low risk or alcohol related problems. Score between 8-15:  moderate risk of alcohol related problems. Score between 16-19:  high risk of alcohol related problems. Score 20 or above:  warrants further diagnostic evaluation for alcohol dependence and treatment.   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  Reports history of medical illness /symptoms which started around a year ago. Reports history of dramatic weight loss , up to 80 lbs within one year, in spite of eating well and maintaining physical activity. Also describes episodes of diaphoresis, episodes of " passing out" and episodes of diarrhea/incontinence .  States he has seen multiple physicians, specialists , and that no definitive diagnosis has been made, but that there has been concern he might have an as of yet undetected Carcinoid . ( Review of Oncology Progress Note indicates work up has been negative for Carcinoid, and current diagnosis of Polycythemia Vera)  In addition to above, he also has chronic pain, and is prescribed Norco, states he takes less than prescribed, and often only a few times a week as needed . Denies any abuse or misuse of this medication.  He states he has recently been increasingly frustrated about his medical condition, his perception that " I am slowly dying  from this ", and that recommended tests , work ups by his physicians have not been approved or been denied by his insurance  .  He reports he recently called a TV station/ News outlet to report that this was occuring and met with a hospital system administrator. States " next thing I know I was arrested by police, and find out that they are saying I made a bomb threat. I never said anything like that " and denies any history of violence or any violent or homicidal ideations .  States that he was briefly incarcerated and returned home recently. Developed some suicidal ideations, with thought of shooting self, due to which he went to his PCP and was redirected to ED .  At this time endorses some depression but states " really I am just exhausted" due to above stressors . Does describe neuro-vegetative symptoms of depression, such as low energy level, anhedonia, insomnia.  Denies current suicidal ideations .  Psychiatric history is remarkable for no prior psychiatric admissions, no history of suicide attempts, denies any history of violence towards others, describes history of anxiety, mainly related to stressors as divorce in the past, remembers having been treated with Buspar. Denies history of mania or of psychosis. He states he was recently started on Zoloft, and denies side effects thus far.  Denies alcohol or drug abuse , but does state he is prescribed " medical marihuana", for his chronic symptoms.  Family history- parents deceased, mother died of Oat Cell Cancer, father of complications of diabetes, has three siblings , denies history of mental illness or of suicides in family .  Dx- Major Depression, without Psychotic Features, versus Depression and Anxiety secondary to Medical Illness   Plan - inpatient admission . Increase Zoloft to 150 mgrs QDAY for depression and anxiety . Increase Neurontin to 200 mgrs BID for anxiety and chronic pain.  Continue Ativan 0.5 mgrs BID for management of anxiety.  Decrease Norco to BID dosing on PRN basis, as states he was not taking regularly . Continue diabetic management .   Patient encouraged to provide written consent for communication with his PCP, Oncologist, Endocrinologist to obtain further information and insure continuity of care on discharge  Have requested Hospitalist consult to review/ help with  medical issues/ management /diabetic management .       Musculoskeletal: Strength & Muscle Tone: within normal limits Gait & Station: normal Patient leans: N/A  Psychiatric Specialty Exam: ROS at this time denies chest pain, denies shortness of breath, denies vomiting, no rash   Blood pressure 131/78, pulse 97, temperature 98.2 F (36.8 C), temperature source Oral, resp. rate 18, height _0  (1.702 m), weight 174 lb (78.926 kg).Body mass index is 27.25 kg/(m^2).  General Appearance: Fairly Groomed  Engineer, water::  Good  Speech:  Normal Rate  Volume:  Decreased  Mood:  Anxious and Depressed  Affect:  anxious   Thought Process:  Linear  Orientation:  Full (Time, Place, and Person)  Thought Content:  Rumination and ruminates about his physical health, and about frustrations with the challenges he states he has been having with health delivery system and with his insurance carried ,no hallucinations , no delusions, not internally preoccupied   Suicidal Thoughts:  No denies current plan or intention of suicide and contracts for safety on the unit, states " I have a lot to live for, my family "  Homicidal Thoughts:  No denies any homicidal ideations, denies violent ideations, denies having made any recent  threats - see above   Memory:  recent and remote grossly intact - 3/3 immediate, 2/3 at 5 minutes   Judgement:  Fair  Insight:  Fair  Psychomotor Activity:  Normal  Concentration:  Good  Recall:  Good  Fund of Knowledge:Good  Language: Good  Akathisia:  Negative  Handed:  Right  AIMS (if indicated):     Assets:  Communication Skills Desire for Improvement Resilience  Sleep:  Number of Hours: 5.25  Cognition: WNL  ADL's:  Intact    COGNITIVE  FEATURES THAT CONTRIBUTE TO RISK:  Closed-mindedness and Loss of executive function    SUICIDE RISK:   Moderate:  Frequent suicidal ideation with limited intensity, and duration, some specificity in terms of plans, no associated intent, good self-control, limited dysphoria/symptomatology, some risk factors present, and identifiable protective factors, including available and accessible social support.  PLAN OF CARE: Patient will be admitted to inpatient psychiatric unit for stabilization and safety. Will provide and encourage milieu participation. Provide medication management and maked adjustments as needed.  Will follow daily.    I certify that inpatient services furnished can reasonably be expected to improve the patient's condition.   Neita Garnet, MD 05/29/2015, 1:13 PM

## 2015-05-29 NOTE — Progress Notes (Signed)
Adult Psychoeducational Group Note  Date:  05/29/2015 Time:  9:30 PM  Group Topic/Focus:  Wrap-Up Group:   The focus of this group is to help patients review their daily goal of treatment and discuss progress on daily workbooks.  Participation Level:  Active  Participation Quality:  Appropriate  Affect:  Appropriate  Cognitive:  Alert  Insight: Appropriate  Engagement in Group:  Engaged  Modes of Intervention:  Discussion  Additional Comments:  Patient stated having a good day. Patient stated he met with the doctor. Patient goal for today was to be able to have a bowel movement.   Sharlyn Odonnel L Vennessa Affinito 05/29/2015, 9:30 PM
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
## 2015-05-30 DIAGNOSIS — R45851 Suicidal ideations: Secondary | ICD-10-CM

## 2015-05-30 LAB — GLUCOSE, CAPILLARY
Glucose-Capillary: 130 mg/dL — ABNORMAL HIGH (ref 65–99)
Glucose-Capillary: 149 mg/dL — ABNORMAL HIGH (ref 65–99)
Glucose-Capillary: 157 mg/dL — ABNORMAL HIGH (ref 65–99)

## 2015-05-30 NOTE — Progress Notes (Signed)
Writer spoke with patient 1:1 concerning his scheduled medications and he became upset  When he was informed about his next dose available for pain medication.He argued with Probation officer about his inusilin that he had no order to give , he was very anxious and upset with Probation officer. Writer informed him to speak with his doctor concerning these issues. Support givne and safety maintained on unit with 15 min checks.

## 2015-05-30 NOTE — [facility] Group Notes (Addendum)
  [place] LCSW Group Therapy  05/30/2015 10:15 to 11 AM  Type of Therapy:  Group Therapy  Participation Level:  Active  Participation Quality:  Intrusive  Affect:  Anxious and Irritable  Cognitive:  Alert and Oriented  Insight:  Limited  Engagement in Therapy:  Limited  Modes of Intervention:  Clarification, Discussion, Exploration, Socialization and Support  Summary of Progress/Problems:  The main focus of today's process group was for the patient to identify ways in which they have in the past sabotaged their own recovery. Motivational Interviewing was utilized to identify motivation they may have for wanting to change. The Stages of Change were explained using a handout, and patients identified where they currently are with regard to stages of change. Patient expressed no interest in identifying what he may be able to change as he sees 'Actuary and lack of good social workers' being responsible for his current inability to enjoy his life.    Sheilah Pigeon, LCSW

## 2015-05-30 NOTE — Progress Notes (Signed)
[facility]  Glucose readings reasonable. He is not taking his insulin. Will continue to monitor.  Debbe Odea, MD

## 2015-05-30 NOTE — Progress Notes (Signed)
See paper chart for initial 1:1 obs note.

## 2015-05-30 NOTE — Progress Notes (Addendum)
   05/30/15 1600  What Happened  Was fall witnessed? No (not by staff, patients present)  Was patient injured? Yes  Patient found on floor (in dayroom)  Found by Staff-comment (fall overheard, staff immediately at patient's side)  Stated prior activity ambulating-unassisted  Follow Up  MD notified Myrle Sheng, NP/I Sabra Heck, MD  Time MD notified 1600  Additional tests Yes-comment  Simple treatment Dressing (bandaids to R elbow)  Progress note created (see row info) Yes  Adult Fall Risk Assessment  Risk Factor Category (scoring not indicated) Fall has occurred during this admission (document High fall risk)  Patient's Fall Risk High Fall Risk (>13 points)  Adult Fall Risk Interventions  Required Bundle Interventions *See Row Information* High fall risk - low, moderate, and high requirements implemented  Additional Interventions Assess orthostatic BP;Fall risk signage;Specialty bed:  Low bed;24 hour supervision/sitter  Fall with Injury Screening  Risk For Fall Injury- See Row Information  Nurse judgement  Intervention(s) for 2 or more risk criteria identified Air cabin crew (low bed)    Patient states he has had syncopal episodes and sometimes senses them in time to sit down however this time, "I was upset from reading the medication information, things went dark." Patient first connected with L knee, then R elbow and unclear as to whether he hit his head (he denies, peer confirms). NP assessed patient, VS obtained. 1:1 obs initiated and fall teaching completed. Order received to D/C neuro-checks. Patient verbalizes understanding and is observed talking, laughing and joking with MHT at side as well as peers. Patient safe.

## 2015-05-30 NOTE — Plan of Care (Signed)
Problem: Diagnosis: Increased Risk For Suicide Attempt Goal: STG-Patient Will Attend All Groups On The Unit Outcome: Progressing Patient attended group tonight and participated.
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
## 2015-05-30 NOTE — Progress Notes (Signed)
1:1 Nursing Note- Patient observed sitting up in the dayroom smiling and talking with a visitor. He voiced no complaints. Writer gave patient time to visit and will speak with him about medications scheduled for 2200. Safety maintained on unit with MHT with patient. 1:1 continues and patient is safe.

## 2015-05-30 NOTE — Progress Notes (Deleted)
   05/30/15 1600  What Happened  Was fall witnessed? No (not by staff, patients present)  Was patient injured? Yes (scrapes to R elbow)  Patient found on floor (in dayroom)  Found by Staff-comment (fall overheard, staff immediately at patient's side)  Stated prior activity ambulating-unassisted  Follow Up  MD notified Myrle Sheng, NP/I Sabra Heck, MD  Time MD notified 1600  Family notified No- patient refusal  Additional tests Yes-comment (EKG obtained)  Simple treatment Dressing (bandaids to R elbow)  Progress note created (see row info) Yes  Adult Fall Risk Assessment  Risk Factor Category (scoring not indicated) Fall has occurred during this admission (document High fall risk)  Patient's Fall Risk High Fall Risk (>13 points)  Adult Fall Risk Interventions  Required Bundle Interventions *See Row Information* High fall risk - low, moderate, and high requirements implemented  Additional Interventions Assess orthostatic BP;Fall risk signage;Specialty bed:  Low bed;24 hour supervision/sitter  Fall with Injury Screening  Risk For Fall Injury- See Row Information  Nurse judgement  Intervention(s) for 2 or more risk criteria identified Air cabin crew (low bed)

## 2015-05-30 NOTE — Plan of Care (Signed)
Problem: Ineffective individual coping Goal: STG: Patient will remain free from self harm Outcome: Progressing Patient is free from safe harm and 1:1 continues for fall safety.

## 2015-05-30 NOTE — Progress Notes (Signed)
Patient has been agitated today, specifically about medications. Irritable, labile and argumentative. Will instruct staff regarding how to provide care. "I saw the NP. That was useless. She said she couldn't help me." "I asked her to fix the health care system but she can't." He rates his depression at a 0/10, hopelessness at a 5/10 and anxiety at a 10/10. Continues to report severe back pain and states, "what? You're giving me one pill every 12 hours? That's a joke. I take 2 every 4 hours. I mean I don't abuse them or anything but I've been on them for so many years."  Patient demanding we call the MD regarding his pain medications, his insulin. Patient redirected with some success. Medicated per orders. Emotional support offered. Self inventory reviewed. Fall precautions in place and teaching reviewed. On reassess, patient decreased to a 7/10. He denies SI/HI and remains safe on level III obs.

## 2015-05-31 DIAGNOSIS — E119 Type 2 diabetes mellitus without complications: Secondary | ICD-10-CM

## 2015-05-31 DIAGNOSIS — F332 Major depressive disorder, recurrent severe without psychotic features: Secondary | ICD-10-CM | POA: Insufficient documentation

## 2015-05-31 LAB — GLUCOSE, CAPILLARY
GLUCOSE-CAPILLARY: 134 mg/dL — AB (ref 65–99)
Glucose-Capillary: 149 mg/dL — ABNORMAL HIGH (ref 65–99)
Glucose-Capillary: 130 mg/dL — ABNORMAL HIGH (ref 65–99)
Glucose-Capillary: 122 mg/dL — ABNORMAL HIGH (ref 65–99)
Glucose-Capillary: 170 mg/dL — ABNORMAL HIGH (ref 65–99)
Glucose-Capillary: 181 mg/dL — ABNORMAL HIGH (ref 65–99)

## 2015-05-31 MED ORDER — GABAPENTIN 100 MG PO CAPS: 100.0000 mg | ORAL_CAPSULE | Freq: Two times a day (BID) | ORAL | Status: DC

## 2015-05-31 NOTE — Progress Notes (Signed)
[facility]  Sugars continue to be stable. He is still not taking his insulin. [facility] will sign off.   Debbe Odea, MD
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
## 2015-05-31 NOTE — Progress Notes (Signed)
1:1 Nursing note- Writer spoke with patient after attending group and he reports having had a good day. His significant other visited tonight. He reports that he plans to take his pain medication tonight with his other medications even though he is experiencing pain currently. He was offered a heat pack but refused stating that they don't work. He returned to the dayroom where he has been watching tv and interacting appropriately with peers. Safety maintained with sitter at patients side, 1:1 continues.

## 2015-05-31 NOTE — Progress Notes (Signed)
Adult Psychoeducational Group Note  Date:  05/31/2015 Time:  9:05 PM  Group Topic/Focus:  Wrap-Up Group:   The focus of this group is to help patients review their daily goal of treatment and discuss progress on daily workbooks.  Participation Level:  Active  Participation Quality:  Appropriate and Attentive  Affect:  Appropriate  Cognitive:  Appropriate  Insight: Appropriate  Engagement in Group:  Engaged  Modes of Intervention:  Discussion  Additional Comments:  Pt stated he had a terrific day. His goal was to get his meds straight, which he did accomplish.  Clint Bolder 05/31/2015, 9:05 PM

## 2015-05-31 NOTE — Progress Notes (Signed)
1:1 Nursing note- Patient is currently lying in bed asleep with eyes closed and respirations even, no distress noted. 1:1 continues with MHT at patient's bedside. Patient remains safe.

## 2015-05-31 NOTE — Progress Notes (Addendum)
Observation note: Pt observed in the hallway, sitting in his wheelchair, with sitter present. Pt reported that he has difficulty walking due to his chronic lower back pain. Writer observed pt ambulate from wheelchair to exam bed. Pt gait noted to be slow and unsteady. Pt remains on 1:1 for high fall risk until d/c'd by MD.   D:Pt irritable this morning while discussing meds. Pt stated that the doctors can't seem to get his meds right. Pt was scheduled Neurontin 200 mg this morning at 8 am. Pt refused to take 200 mg and requested 100 mg. Writer explained to pt, that the order would have to be changed to 100 mg before administered. Writer informed Tanika, NP., and she verbalized that she will meet with pt and make changes at that time.  Pt verbalized that he's not depressed but dealing with a lot of medical issues. Pt stated that he's anxious related to his medical problems and tx. Pt rates anxiety 5/10. Pt denies suicidal thoughts. A: Medications administered as ordered per MD. Verbal support provided. Pt encouraged to attend groups. 15 minute checks performed for safety. R: Pt stated goal "getting my meds corrected".

## 2015-05-31 NOTE — Progress Notes (Signed)
Observation note: Pt observed lying in bed with sitter present at bedside. Pt refused to eat lunch due to his frustration with meds and TX. Pt continues to use wheelchair due to unsteady gait. Wheelchair at bedside, with in arms length of pt. Pt remains on 1:1 for safety.
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
## 2015-05-31 NOTE — BHH Group Notes (Signed)
[place] Group Notes:  (Nursing/MHT/Case Management/Adjunct)  Date:  05/31/2015  Time:  1315  Type of Therapy:  Nurse Education  /  Healthy Support Systems :  The group focuses on teaching patients how to develop and utilize healthy support systems.  Participation Level:  Active  Participation Quality:  Appropriate  Affect:  Appropriate  Cognitive:  Appropriate  Insight:  Limited  Engagement in Group:  Engaged  Modes of Intervention:  Education  Summary of Progress/Problems:  Stephen Herrera 05/31/2015, 4:00 PM

## 2015-05-31 NOTE — Progress Notes (Signed)
Observation note: Pt observed ambulating in the hallway with sitter present. Pt gait slow and unsteady. Pt occasionally holding onto the railing while walking. Pt remains on 1:1 observation for safety.

## 2015-05-31 NOTE — Progress Notes (Signed)
Patient was lying in bed asleep and was awakened briefly for his vitals to be taken. He returned back to sleep once MHT left the room. Safety maintained and 1:1 continue with MHT at bedside.

## 2015-06-01 LAB — GLUCOSE, CAPILLARY
GLUCOSE-CAPILLARY: 143 mg/dL — AB (ref 65–99)
GLUCOSE-CAPILLARY: 179 mg/dL — AB (ref 65–99)
GLUCOSE-CAPILLARY: 127 mg/dL — AB (ref 65–99)
Glucose-Capillary: 124 mg/dL — ABNORMAL HIGH (ref 65–99)
Glucose-Capillary: 116 mg/dL — ABNORMAL HIGH (ref 65–99)

## 2015-06-01 MED ORDER — OXYCODONE-ACETAMINOPHEN 5-325 MG PO TABS
2.0000 | ORAL_TABLET | Freq: Once | ORAL | Status: DC
  Filled 2015-06-01: qty 2

## 2015-06-01 MED ORDER — OXYCODONE HCL 5 MG PO TABS: 20.0000 mg | ORAL_TABLET | Freq: Once | ORAL | Status: DC

## 2015-06-01 MED ORDER — MIRTAZAPINE 7.5 MG PO TABS
7.5000 mg | ORAL_TABLET | Freq: Every day | ORAL | Status: DC
  Administered 2015-06-02 – 2015-06-03 (×2): 7.5 mg via ORAL
  Filled 2015-06-01 (×5): qty 1

## 2015-06-01 MED ORDER — OXYCODONE HCL 5 MG PO TABS
10.0000 mg | ORAL_TABLET | Freq: Once | ORAL | Status: AC
  Administered 2015-06-02: 10 mg via ORAL
  Filled 2015-06-01: qty 2

## 2015-06-01 MED ORDER — SERTRALINE HCL 100 MG PO TABS
100.0000 mg | ORAL_TABLET | Freq: Every day | ORAL | Status: DC
  Administered 2015-06-02 – 2015-06-04 (×3): 100 mg via ORAL
  Filled 2015-06-01 (×5): qty 1

## 2015-06-01 MED ORDER — OXYCODONE-ACETAMINOPHEN 5-325 MG PO TABS
2.0000 | ORAL_TABLET | Freq: Once | ORAL | Status: AC
  Administered 2015-06-02: 2 via ORAL
  Filled 2015-06-01: qty 2

## 2015-06-01 NOTE — BHH Suicide Risk Assessment (Signed)
[place] INPATIENT:  Family/Significant Other Suicide Prevention Education  Suicide Prevention Education:  Education Completed; Stephen Herrera, Pt's fiance [phone],  has been identified by the patient as the family member/significant other with whom the patient will be residing, and identified as the person(s) who will aid the patient in the event of a mental health crisis (suicidal ideations/suicide attempt).  With written consent from the patient, the family member/significant other has been provided the following suicide prevention education, prior to the and/or following the discharge of the patient.  The suicide prevention education provided includes the following:  Suicide risk factors  Suicide prevention and interventions  National Suicide Hotline telephone number  [facility] assessment telephone number  [facility] Emergency Assistance [place] and/or Residential Mobile Crisis Unit telephone number  Request made of family/significant other to:  Remove weapons (e.g., guns, rifles, knives), all items previously/currently identified as safety concern.    Remove drugs/medications (over-the-counter, prescriptions, illicit drugs), all items previously/currently identified as a safety concern.  The family member/significant other verbalizes understanding of the suicide prevention education information provided.  The family member/significant other agrees to remove the items of safety concern listed above.  [name] 06/01/2015, 4:03 PM

## 2015-06-01 NOTE — Progress Notes (Signed)
Pt has been taken off 1:1 observations as per doctor's order. Pt was happy to off 1:1, pt stated, " I did not fall because I can't walk, I fell because I was anxious." Pt remains on the wheel chair for safety purposes. Pt maintained on Q 15 min observation. We will continue to monitor.

## 2015-06-01 NOTE — Progress Notes (Signed)
1:1 note; Pt is currently in the dayroom interacting with peers and staff. Pt denies feeling of dizziness, no fall observed or reported. Staff remains with the patient.  As per self inventory, pt had a fair sleep, good appetite, low energy, and good concentration. Pt rates depression at a 0, hopelessness at a 5, and anxiety at a 3. Pt complained of back pain, medications given as scheduled. Will continue to monitor.
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
## 2015-06-01 NOTE — Progress Notes (Signed)
1:1 Note; Pt is observed on the hall way talking to the writer about is medications. No falls or dizziness reported. Staff by the pt, will continue to monitor.

## 2015-06-01 NOTE — Progress Notes (Signed)
Recreation Therapy Notes  Date: 04.10.2017 Time: 9:30am Location: [address] Group Room   Group Topic: Stress Management  Goal Area(s) Addresses:  Patient will actively participate in stress management techniques presented during session.   Behavioral Response: Appropriate, Engaged  Intervention: Stress management techniques  Activity :  Diaphragmatic Breathing, Mindful Breathing and Mindfulness. LRT provided education, instruction and demonstration on practice of Diaphragmatic Breathing, Mindful Breathing and Mindfulness. Patient was asked to participate in technique introduced during session.   Education:  Stress Management, Discharge Planning.   Education Outcome: Acknowledges education  Clinical Observations/Feedback: Patient actively engaged in technique introduced, expressed no concerns and demonstrated ability to practice independently post d/c.   Laureen Ochs Ricarda Atayde, LRT/CTRS        Edith Groleau L 06/01/2015 1:45 PM

## 2015-06-01 NOTE — Progress Notes (Signed)
1:1 Nursing note- Patient currently lying in bed asleep with eyes closed and respirations even and unlabored. 1:1 continues and patient is safe.

## 2015-06-01 NOTE — Progress Notes (Signed)
Pt came back from dinner, no complains, no falls observed at this time, will continue to monitor.

## 2015-06-01 NOTE — Progress Notes (Signed)
1:1 Nursing note - Patient was laying in bed awake, writer asked if he was having trouble sleeping and he c/o of his leg pain bothering him. He was offered a heat pack but declined and reported that he would be okay once he got comfortable. Safety maintaned with sitter at bedside. 1:1 continues and patient is safe.

## 2015-06-01 NOTE — BHH Group Notes (Signed)
[facility] LCSW Aftercare Discharge Planning Group Note  06/01/2015 8:45 AM  Participation Quality: Alert, Appropriate and Oriented  Mood/Affect: Appropriate  Depression Rating: 0  Anxiety Rating: 0  Thoughts of Suicide: Pt denies SI/HI  Will you contract for safety? Yes  Current AVH: Pt denies  Plan for Discharge/Comments: Pt attended discharge planning group and actively participated in group. CSW discussed suicide prevention education with the group and encouraged them to discuss discharge planning and any relevant barriers. Pt reports feeling "wonderful" and attributes SI to being medication induced. Pt is hopeful for DC today.  Transportation Means: Pt reports access to transportation  Supports: No supports mentioned at this time  Peri Maris, [place] 06/01/2015 9:31 AM

## 2015-06-01 NOTE — Progress Notes (Signed)
Adult Psychoeducational Group Note  Date:  06/01/2015 Time:  9:00 PM  Group Topic/Focus:  Wrap-Up Group:   The focus of this group is to help patients review their daily goal of treatment and discuss progress on daily workbooks.  Participation Level:  Active  Participation Quality:  Appropriate  Affect:  Appropriate  Cognitive:  Appropriate  Insight: Appropriate  Engagement in Group:  Engaged  Modes of Intervention:  Socialization and Support  Additional Comments:  Patient attended and participated in group tonight. He reports having a good day. He got off his 1:1 today. He is now voluntary in stead of involuntary. He had his medication straightened out. He went for his meals and attended groups.  Salley Scarlet [facility] 06/01/2015, 9:00 PM

## 2015-06-01 NOTE — BHH Group Notes (Signed)
[place] LCSW Group Therapy  06/01/2015 1:15pm  Type of Therapy:  Group Therapy vercoming Obstacles  Participation Level:  Active  Participation Quality:  Appropriate   Affect:  Appropriate  Cognitive:  Appropriate and Oriented  Insight:  Developing/Improving and Improving  Engagement in Therapy:  Improving  Modes of Intervention:  Discussion, Exploration, Problem-solving and Support  Description of Group:   In this group patients will be encouraged to explore what they see as obstacles to their own wellness and recovery. They will be guided to discuss their thoughts, feelings, and behaviors related to these obstacles. The group will process together ways to cope with barriers, with attention given to specific choices patients can make. Each patient will be challenged to identify changes they are motivated to make in order to overcome their obstacles. This group will be process-oriented, with patients participating in exploration of their own experiences as well as giving and receiving support and challenge from other group members.  Summary of Patient Progress: Pt continues to have a negative perception of his ability to handle his current obstacles involving medical bureaucracy impeding his access to treatment. Pt identifies a need to "accept" the situation but is resistant to suggestions as he already has an explanation for why that will not work.    Therapeutic Modalities:   Cognitive Behavioral Therapy Solution Focused Therapy Motivational Interviewing Relapse Prevention Therapy   Peri Maris, LCSWA 06/01/2015 5:43 PM

## 2015-06-01 NOTE — Progress Notes (Signed)
1:1 Note. Pt is seated in the day room at this time watching TV. Pt uses wheel chair for safety. Pt denied feeling dizzy, pt encouraged to get up slow from seating position to standing position. No fall reported or observed, staff remains by pt's side, will continue to monitor.

## 2015-06-02 LAB — GLUCOSE, CAPILLARY
GLUCOSE-CAPILLARY: 159 mg/dL — AB (ref 65–99)
GLUCOSE-CAPILLARY: 112 mg/dL — AB (ref 65–99)
Glucose-Capillary: 156 mg/dL — ABNORMAL HIGH (ref 65–99)
Glucose-Capillary: 133 mg/dL — ABNORMAL HIGH (ref 65–99)

## 2015-06-02 MED ORDER — OXYCODONE HCL 5 MG PO TABS
10.0000 mg | ORAL_TABLET | Freq: Once | ORAL | Status: AC
Start: 2015-06-02 — End: 2015-06-02
  Administered 2015-06-02: 10 mg via ORAL
  Filled 2015-06-02: qty 2

## 2015-06-02 MED ORDER — OXYCODONE-ACETAMINOPHEN 5-325 MG PO TABS
2.0000 | ORAL_TABLET | Freq: Once | ORAL | Status: AC
  Administered 2015-06-02: 2 via ORAL
  Filled 2015-06-02: qty 2

## 2015-06-02 MED ORDER — HYDROCODONE-ACETAMINOPHEN 10-325 MG PO TABS
1.0000 | ORAL_TABLET | Freq: Three times a day (TID) | ORAL | Status: DC | PRN
  Filled 2015-06-02: qty 1

## 2015-06-02 MED ORDER — LORAZEPAM 0.5 MG PO TABS
0.5000 mg | ORAL_TABLET | Freq: Every evening | ORAL | Status: DC | PRN
  Administered 2015-06-03: 0.5 mg via ORAL
  Filled 2015-06-02: qty 1

## 2015-06-02 MED ORDER — LORAZEPAM 0.5 MG PO TABS: 0.5000 mg | ORAL_TABLET | Freq: Every day | ORAL | Status: DC

## 2015-06-02 NOTE — Progress Notes (Signed)
Recreation Therapy Notes  Animal-Assisted Activity (AAA) Program Checklist/Progress Notes Patient Eligibility Criteria Checklist & Daily Group note for Rec Tx Intervention  Date: 04.12.2017 Time: 2:45pm Location: [address]   AAA/T Program Assumption of Risk Form signed by Patient/ or Parent Legal Guardian Yes  Patient is free of allergies or sever asthma Yes  Patient reports no fear of animals Yes  Patient reports no history of cruelty to animals Yes  Patient understands his/her participation is voluntary Yes  Patient washes hands before animal contact Yes  Patient washes hands after animal contact Yes  Behavioral Response: Appropriate   Education: Hand Washing, Appropriate Animal Interaction   Education Outcome: Acknowledges education.   Clinical Observations/Feedback: Patient interacted appropriately with therapy dog and peers during session.    Laureen Ochs Kingdavid Lein[place], LRT/CTRS        Elieser Tetrick L 06/02/2015 3:09 PM

## 2015-06-02 NOTE — Progress Notes (Signed)
[place] [facility] 1:1 Observation Documentation  For the first (8) hours following discontinuation of 1:1 precautions, a progress note entry by nursing staff should be documented at least every 2 hours, reflecting the patient's behavior, condition, mood, and conversation.  Use the progress notes for additional entries.  Time 1:1 discontinued:  1608  Patient's Behavior:  Asleep    Patient's Condition:  Respirations even and unlabored   Patient's Conversation:  N/A  [name] 06/02/2015, 5:39 AM

## 2015-06-02 NOTE — Progress Notes (Signed)
Pt attended spiritual care group on grief and loss facilitated by chaplain Tavon Corriher   Group opened with brief discussion and psycho-social ed around grief and loss in relationships and in relation to self - identifying life patterns, circumstances, changes that cause losses. Established group norm of speaking from own life experience. Group goal of establishing open and affirming space for members to share loss and experience with grief, normalize grief experience and provide psycho social education and grief support.     

## 2015-06-02 NOTE — Progress Notes (Signed)
[place] [facility] 1:1 Observation Documentation  For the first (8) hours following discontinuation of 1:1 precautions, a progress note entry by nursing staff should be documented at least every 2 hours, reflecting the patient's behavior, condition, mood, and conversation.  Use the progress notes for additional entries.  Time 1:1 discontinued:  1608  Patient's Behavior:  Irritable, labile  Patient's Condition:  Alert and oriented   Patient's Conversation:  Attempted to explain that his medications were document incorrectly during the adm.   Wynonia Hazard [place] 06/02/2015, 5:28 AM

## 2015-06-02 NOTE — BHH Group Notes (Signed)

## 2015-06-02 NOTE — BHH Group Notes (Signed)
Adult Psychoeducational Group Note  Date:  06/02/2015 Time:  9:45 PM  Group Topic/Focus:  Wrap-Up Group:   The focus of this group is to help patients review their daily goal of treatment and discuss progress on daily workbooks.  Participation Level:  None  Participation Quality:  Resistant  Affect:  Anxious  Cognitive:  Alert  Insight: None  Engagement in Group:  None  Modes of Intervention:  Discussion  Additional Comments:  Pt introduced himself but did not participate.  Stephen Herrera A 06/02/2015, 9:45 PM

## 2015-06-02 NOTE — Progress Notes (Signed)
DAR NOTE: Pt present with flat affect and depressed mood in the unit. Pt attended groups and participated. Pt complained of lower back pain, took all his meds as scheduled. As per self inventory, pt had a poor night sleep, good appetite, low energy, and good concentration. Pt rate depression at 0, hopeless ness at 5, and anxiety at 2. Pt's safety ensured with 15 minute and environmental checks. Pt currently denies SI/HI and A/V hallucinations. Pt verbally agrees to seek staff if SI/HI or A/VH occurs and to consult with staff before acting on these thoughts. Will continue POC.
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
## 2015-06-02 NOTE — [facility] Group Notes (Signed)
[place] LCSW Group Therapy 06/02/2015 1:15 PM  Type of Therapy: Group Therapy- Feelings about Diagnosis  Participation Level: Active   Participation Quality:  Appropriate  Affect:  Appropriate  Cognitive: Alert and Oriented   Insight:  Developing   Engagement in Therapy: Developing/Improving and Engaged   Modes of Intervention: Clarification, Confrontation, Discussion, Education, Exploration, Limit-setting, Orientation, Problem-solving, Rapport Building, Art therapist, Socialization and Support  Description of Group:   This group will allow patients to explore their thoughts and feelings about diagnoses they have received. Patients will be guided to explore their level of understanding and acceptance of these diagnoses. Facilitator will encourage patients to process their thoughts and feelings about the reactions of others to their diagnosis, and will guide patients in identifying ways to discuss their diagnosis with significant others in their lives. This group will be process-oriented, with patients participating in exploration of their own experiences as well as giving and receiving support and challenge from other group members.  Summary of Progress/Problems:  Pt discussed that he gets frustrated with the mental health system however feels that the stigma associated with mental illness has improved. Pt reports that he feels it is important to be invested in others in order to improve his depression.  Therapeutic Modalities:   Cognitive Behavioral Therapy Solution Focused Therapy Motivational Interviewing Relapse Prevention Therapy  Peri Maris, LCSWA 06/02/2015 5:42 PM

## 2015-06-02 NOTE — Progress Notes (Signed)
Stephen Herrera 1:1 Observation Documentation  For the first (8) hours following discontinuation of 1:1 precautions, a progress note entry by nursing staff should be documented at least every 2 hours, reflecting the patient's behavior, condition, mood, and conversation.  Use the progress notes for additional entries.  Time 1:1 discontinued: 1602   Patient's Behavior:  Pt is sleeping  Patient's Condition:  Respirations and unlabored  Patient's Conversation:  N/A  [name] 06/02/2015, 5:33 AM

## 2015-06-03 LAB — GLUCOSE, CAPILLARY
GLUCOSE-CAPILLARY: 118 mg/dL — AB (ref 65–99)
GLUCOSE-CAPILLARY: 108 mg/dL — AB (ref 65–99)
GLUCOSE-CAPILLARY: 125 mg/dL — AB (ref 65–99)
Glucose-Capillary: 152 mg/dL — ABNORMAL HIGH (ref 65–99)

## 2015-06-03 MED ORDER — OXYCODONE-ACETAMINOPHEN 5-325 MG PO TABS
2.0000 | ORAL_TABLET | Freq: Four times a day (QID) | ORAL | Status: DC | PRN
Start: 2015-06-03 — End: 2015-06-04
  Administered 2015-06-03: 2 via ORAL
  Filled 2015-06-03 (×3): qty 2

## 2015-06-03 NOTE — Progress Notes (Signed)
DAR NOTE: Patient presents with angry mood and affect.  Denies auditory and visual hallucinations.  Maintained on routine safety checks.  Medications given as prescribed.  Support and encouragement offered as needed.  Requested and received Oxycodone for complain of severe pain.  Patient was very irritable and agitated because his pain medication was not changed per patient request.  No signs/symptoms of hypoglycemic reaction noted.

## 2015-06-03 NOTE — Progress Notes (Signed)
Adult Psychoeducational Group Note  Date:  06/03/2015 Time:  9:42 PM  Group Topic/Focus:  Wrap-Up Group:   The focus of this group is to help patients review their daily goal of treatment and discuss progress on daily workbooks.  Participation Level:  Active  Participation Quality:  Appropriate  Affect:  Appropriate  Cognitive:  Alert  Insight: Appropriate  Engagement in Group:  Engaged  Modes of Intervention:  Discussion  Additional Comments:  Patient stated "I had a good day, until 4:00 pm. Patient goal for today was "to correct the doctor, and it worked".   Nanetta Wiegman L Larena Ohnemus 06/03/2015, 9:42 PM

## 2015-06-03 NOTE — Progress Notes (Signed)
DAR NOTE: Pt present with flat affect and depressed mood in the unit. Pt has been isolating himself and has been bed most of the time. Pt denies physical pain, took all his meds as scheduled. As per self inventory, pt had a good night sleep, good appetite, low energy, and poor concentration. Pt rate depression at 0, hopeless ness at 5, and anxiety at 2. Pt's goal for today is to "contring myself." Pt's safety ensured with 15 minute and environmental checks. Pt currently denies SI/HI and A/V hallucinations. Pt verbally agrees to seek staff if SI/HI or A/VH occurs and to consult with staff before acting on these thoughts. Will continue POC.
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
## 2015-06-03 NOTE — Tx Team (Signed)
Interdisciplinary Treatment Plan Update (Adult) Date: 06/03/2015   Date: 06/03/2015 4:34 PM  Progress in Treatment:  Attending groups: Yes  Participating in groups: Yes  Taking medication as prescribed: Yes  Tolerating medication: Yes  Family/Significant othe contact made: Yes with fiance Patient understands diagnosis: Yes AEB seeking help with depression Discussing patient identified problems/goals with staff: Yes  Medical problems stabilized or resolved: Yes  Denies suicidal/homicidal ideation: Yes Patient has not harmed self or Others: Yes   New problem(s) identified: None identified at this time.   Discharge Plan or Barriers: Pt will return home and follow-up with outpatient providers  Additional comments:  Patient and CSW reviewed pt's identified goals and treatment plan. Patient verbalized understanding and agreed to treatment plan. CSW reviewed [facility] "Discharge Process and Patient Involvement" Form. Pt verbalized understanding of information provided and signed form.   Reason for Continuation of Hospitalization:  Anxiety Depression Medication stabilization Suicidal ideation  Estimated length of stay: 1 day  Review of initial/current patient goals per problem list:   1.  Goal(s): Patient will participate in aftercare plan  Met:  Yes  Target date: 3-5 days from date of admission   As evidenced by: Patient will participate within aftercare plan AEB aftercare provider and housing plan at discharge being identified.   05/29/15: Pt will return home and follow-up with outpatient providers  2.  Goal (s): Patient will exhibit decreased depressive symptoms and suicidal ideations.  Met:  Adequate for DC  Target date: 3-5 days from date of admission   As evidenced by: Patient will utilize self rating of depression at 3 or below and demonstrate decreased signs of depression or be deemed stable for discharge by MD.  05/29/15: Pt rates depression at 10/10; denies SI  06/03/15: MD  feels that Pt's symptoms have decreased to the point that they can be managed in an outpatient setting.   3.  Goal(s): Patient will demonstrate decreased signs and symptoms of anxiety.  Met:  Adequate for DC  Target date: 3-5 days from date of admission   As evidenced by: Patient will utilize self rating of anxiety at 3 or below and demonstrated decreased signs of anxiety, or be deemed stable for discharge by MD  05/29/15: Pt rates anxiety at 10/10  06/03/15: MD feels that Pt's symptoms have decreased to the point that they can be managed in an outpatient setting.   Attendees:  Patient:    Family:    Physician: Dr. Parke Poisson, MD  06/03/2015 4:34 PM  Nursing: Lars Pinks, RN Case manager  06/03/2015 4:34 PM  Clinical Social Worker Peri Maris, [place] 06/03/2015 4:34 PM  Other: Tilden Fossa, [place] 06/03/2015 4:34 PM  Clinical: Eulogio Bear RN; Marcella Dubs, RN 06/03/2015 4:34 PM  Other: , RN Charge Nurse 06/03/2015 4:34 PM  Other:     Peri Maris, [place] Social Work [phone]

## 2015-06-03 NOTE — Progress Notes (Signed)
Recreation Therapy Notes  Date: 04.12.2017 Time: 9:30am Location: [address] Group Room   Group Topic: Stress Management  Goal Area(s) Addresses:  Patient will actively participate in stress management techniques presented during session.   Behavioral Response: Engaged, Appropriate   Intervention: Stress management techniques  Activity :  Deep Breathing and Guided Imagery. LRT provided education, instruction and demonstration on practice of Deep Breathing and Guided Imagery. Patient was asked to participate in technique introduced during session.   Education:  Stress Management, Discharge Planning.   Education Outcome: Acknowledges education  Clinical Observations/Feedback: Patient actively engaged in technique introduced and demonstrated ability to practice independently post d/c. Patient inquired about assistance with pain and the stress it causes him. LRT encouraged patient to participate in yoga and water based exercise, as well as use techniques he has learned inpatient to manage his stress. Patient acknowledged understanding, but appeared apprehensive about effectiveness.  Laureen Ochs Zykira Matlack, LRT/CTRS        Lane Hacker 06/03/2015 12:06 PM

## 2015-06-03 NOTE — [facility] Group Notes (Signed)
[place] LCSW Group Therapy 06/03/2015 1:15 PM  Type of Therapy: Group Therapy- Emotion Regulation  Pt did not attend, declined invitation.   Peri Maris, LCSWA 06/03/2015 4:53 PM
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
## 2015-06-04 LAB — GLUCOSE, CAPILLARY
Glucose-Capillary: 106 mg/dL — ABNORMAL HIGH (ref 65–99)
Glucose-Capillary: 167 mg/dL — ABNORMAL HIGH (ref 65–99)

## 2015-06-04 MED ORDER — INSULIN NPH ISOPHANE & REGULAR (70-30) 100 UNIT/ML ~~LOC~~ SUSP: 50.0000 [IU] | SUBCUTANEOUS | Status: DC

## 2015-06-04 MED ORDER — METFORMIN HCL ER 500 MG PO TB24: 500.0000 mg | ORAL_TABLET | Freq: Every evening | ORAL | Status: DC | PRN

## 2015-06-04 MED ORDER — EMPAGLIFLOZIN 25 MG PO TABS: 25.0000 mg | ORAL_TABLET | Freq: Every day | ORAL | Status: DC

## 2015-06-04 MED ORDER — SERTRALINE HCL 100 MG PO TABS: 100.0000 mg | ORAL_TABLET | Freq: Every day | ORAL | Status: DC

## 2015-06-04 MED ORDER — MIRTAZAPINE 7.5 MG PO TABS: 7.5000 mg | ORAL_TABLET | Freq: Every day | ORAL | Status: DC

## 2015-06-04 MED ORDER — ATORVASTATIN CALCIUM 40 MG PO TABS: 40.0000 mg | ORAL_TABLET | Freq: Every day | ORAL | Status: DC

## 2015-06-04 MED ORDER — ASPIRIN 81 MG PO TABS: 81.0000 mg | ORAL_TABLET | Freq: Every day | ORAL | Status: AC

## 2015-06-04 MED ORDER — TESTOSTERONE CYPIONATE 200 MG/ML IM SOLN: 160.0000 mg | INTRAMUSCULAR | Status: DC

## 2015-06-04 MED ORDER — LIDOCAINE 5 % EX PTCH: 1.0000 | MEDICATED_PATCH | CUTANEOUS | Status: DC

## 2015-06-04 NOTE — Progress Notes (Signed)
Patient ID: Stephen Herrera, male   DOB: [birth year], 60 y.o.   MRN: [id number] Pt discharged home with his wife. Pt was stable and appreciative at that time. All papers and prescriptions were given and valuables returned. Verbal understanding expressed. Denies SI/HI and A/VH. Pt given opportunity to express concerns and ask questions.
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
## 2015-06-04 NOTE — Progress Notes (Addendum)
[facility] 1:1 Observation Documentation  For the first (8) hours following discontinuation of 1:1 precautions, a progress note entry by nursing staff should be documented at least every 2 hours, reflecting the patient's behavior, condition, mood, and conversation.  Use the progress notes for additional entries.  Time 1:1 discontinued:  09:44am  Patient's Behavior:  Pt currently sitting in dayroom, leaning forward, looking at group Speaker.   Patient's Condition:  Pt in no current distress, respirations even and unlabored.   Patient's Conversation:  UTA, pt in group currently.   Elenore Rota 06/04/2015, 1400

## 2015-06-04 NOTE — Progress Notes (Signed)
Patient ID: Stephen Herrera, male   DOB: 07/01/1955, 60 y.o.   MRN: [id number] [facility] Post 1:1 Observation Documentation  For the first (8) hours following discontinuation of 1:1 precautions, a progress note entry by nursing staff should be documented at least every 2 hours, reflecting the patient's behavior, condition, mood, and conversation.  Use the progress notes for additional entries.  Time 1:1 discontinued:  09:44am  Patient's Behavior:  Pt currently in office with MD. Pt is sitting in his wheelchair. Pt is talking to MD and PA student.   Patient's Condition:  Pt in no current distress. Pt remains safe.   Patient's Conversation:  UTA, pt in consultation with MD. Will continue to monitor, follow POC.  Elenore Rota 06/04/2015, 10:00am

## 2015-06-04 NOTE — [facility] Suicide Risk Assessment (Addendum)
[facility] Discharge Suicide Risk Assessment   Principal Problem: MDD (major depressive disorder), recurrent episode, severe ([place]) Discharge Diagnoses:  Patient Active Problem List   Diagnosis Date Noted  . Severe episode of recurrent major depressive disorder, without psychotic features ([place]) [F33.2]   . DM type 2 goal A1C below 7.5 [E11.9]   . GAD (generalized anxiety disorder) [F41.1] 05/28/2015  . MDD (major depressive disorder), recurrent episode, severe ([place]) [F33.2] 05/28/2015  . Suicidal ideation [R45.851]   . Polycythemia vera ([place]) [D45] 03/31/2015  . Carcinoid syndrome ([place]) [E34.0] 02/26/2015  . Diarrhea [R19.7] 02/26/2015  . Abdominal pain [R10.9] 09/16/2014  . Polycythemia [D75.1] 09/16/2014  . Chest pain [R07.9] 09/13/2014  . Erythrocytosis [D75.1] 09/13/2014  . Syncope [R55] 09/13/2014  . Hyperhidrosis [L74.519] 04/30/2014  . Benign positional vertigo [H81.10] 10/26/2011  . Rash [R21] 10/04/2011  . Night sweats [R61] 06/03/2011  . Anxiety [F41.9] 01/26/2011  . OSA (obstructive sleep apnea) [G47.33] 08/09/2010  . DM (diabetes mellitus) ([place]) [E11.9] 06/03/2010  . Preventative health care [Z00.00] 06/03/2010  . Urinary retention with incomplete bladder emptying [R33.9] 06/03/2010  . COPD (chronic obstructive pulmonary disease) ([place]) [J44.9]   . IBS (irritable bowel syndrome) [K58.9]   . Chronic fatigue syndrome [R53.82]   . Chronic pain [G89.29]   . ADD (attention deficit disorder) [F90.9]   . Gout [274]   . Hyperlipidemia [E78.5]   . CAD (coronary atherosclerotic disease) [I25.10]   . Hypogonadism male [E29.1]   . Hypothyroidism [E03.9]   . Chest pain syndrome [R07.9]   . Allergic rhinitis, seasonal [J30.2]   . History of colonoscopy with polypectomy [Z98.890, Z86.010]     Total Time spent with patient: 30 minutes  Musculoskeletal: Strength & Muscle Tone: within normal limits Gait & Station: normal Patient leans: N/A  Psychiatric Specialty Exam: ROS  Blood  pressure 123/74, pulse 89, temperature 97.5 F (36.4 C), temperature source Oral, resp. rate 16, height 5\' 7"  (1.702 m), weight 174 lb (78.926 kg), SpO2 99 %.Body mass index is 27.25 kg/(m^2).  General Appearance: Well Groomed  Eye Contact::  Good  Speech:  Normal [id number]  Volume:  Normal  Mood:  states he is feeling better   Affect:  mildly irritable, but more reactive today.  Thought Process:  Linear  Orientation:  Full (Time, Place, and Person)  Thought Content:  denies hallucinations, no delusions   Suicidal Thoughts:  No denies any suicidal ideations , denies any self injurious ideations   Homicidal Thoughts:  No denies any violent or homicidal ideations   Memory:  recent and remote grossly intact   Judgement:  Other:  improving   Insight:  partially improved   Psychomotor Activity:  Normal  Concentration:  Good  Recall:  Good  Fund of Knowledge:Good  Language: Good  Akathisia:  Negative  Handed:  Right  AIMS (if indicated):     Assets:  Communication Skills Desire for Improvement Resilience  Sleep:  Number of Hours: 5.5  Cognition: WNL  ADL's:  Intact   Mental Status Per Nursing Assessment::   On Admission:  Suicidal ideation indicated by patient, Self-harm thoughts, Self-harm behaviors  Demographic Factors:  60 year old man, lives with SO   Loss Factors: Reports having chronic medical illness which has caused intermittent symptoms and severe weight loss, and which has not yet been clearly identified, reports difficulty dealing with medical and insurance systems to get adequate work up for the above, 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 Chronic pain  Historical  Factors: History of depression, denies history of mania or of psychosis. Denies history of violence   Risk Reduction Factors:   Sense of responsibility to family, Living with another person, especially a relative, Positive social support and Positive coping skills or problem  solving skills  Continued Clinical Symptoms:  Patient improved at this time- at present alert, attentive, calm, cooperative, mood is " better", states depression is improved, affect more reactive and not irritable at this time, no thought disorder, no SI , no HI, denies any violent ideations , no hallucinations, no delusions, 0x3, future oriented  As discussed with Nursing Staff patient had episode of anger and irritability last evening, associated with concern that opiate analgesic dose prescribed is lower than what he is prescribed by outpatient MD . At this time much calmer, able to review episode.   Patient expressing a clearer insight into depression, irritability- states he realizes he has been frustrated and fearful regarding his medical issues as above, and feeling that the medical system has not been able to effectively identify, resolve his symptoms. States that these feelings lead to episodes of angry outbursts, but denies any violent ideations or intent. States he realizes these episodes " are a problem", and is hoping that they will decrease with therapy . We have discussed pharmacological options such as Depakote ER to decrease angry outbursts, but declined due to side effect concerns .  At this time denies medication side effects. Side effects have been reviewed - aware of sedation potential , habit forming potential of opiate analgesics   Looking forward to going home today.   Cognitive Features That Contribute To Risk:  No gross cognitive deficits noted upon discharge. Is alert , attentive, and oriented x 3   Suicide Risk:  Mild:  Suicidal ideation of limited frequency, intensity, duration, and specificity.  There are no identifiable plans, no associated intent, mild dysphoria and related symptoms, good self-control (both objective and subjective assessment), few other risk factors, and identifiable protective factors, including available and accessible social  support.  Follow-up Information    Follow up with [facility].   Why:  Staff will contact you today after discharge to schedule your appointment for individual therapy. If you do not receive a call, please contact them at the number below.   Contact information:   [address] [place], [place] [address] Phone: [phone] Fax: [phone]      Follow up with [facility] On 06/29/2015.   Why:  at 11:15am with Iran Sizer for medication management.   Contact information:   [id number] [place] [address] [phone]      Plan Of Care/Follow-up recommendations:  Activity:  as tolerated  Diet:  Heart Healthy, Diabetic Diet  Tests:  NA Other:  see below  Patient is planning on returning home. Plans to follow up as above. We have also discussed possible benefits of meditation and mindfulness  Patient plans to follow up with his established providers for ongoing medical treatment as appropriate He has outpatient Neurologist ( Dr. Orvilla Cornwall), Endocrinologist ( Dr. Buddy Duty), Oncologist  ( Dr. Irene Limbo) , and has established PCP . Neita Garnet, MD 06/04/2015, 11:47 AM

## 2015-06-04 NOTE — Progress Notes (Addendum)
Patient ID: Stephen Herrera, male   DOB: 07/07/1955, 60 y.o.   MRN: [id number]  Pt currently presents with a flat  affect and anxious behavior. Per self inventory, pt rates depression at a 0, hopelessness 5 and anxiety 0. Pt's daily goal is to "learning how to control my self when I can't change things" and they intend to do so by "?". Pt reports good sleep, a good appetite, low energy and poor concentration. Pt reports increase anxiety about not taking "10-650 of percocet at night." Pt reports that he was taking this at home and wishes to be on it while at [facility]. Pt reports that he has increased anxiety "in the afternoon, I can't figure out why, I need something for then, what will I do?"    Pt provided with medications per providers orders. Pt's labs and vitals were monitored throughout the day. Pt supported emotionally and encouraged to express concerns and questions. Pt educated on medications. Pt also educated on anxiety and alternative relaxation techniques. Pt receptive to learning, needs reinforcement. Pt removed from 1:1 at 0944 per MD orders.   Pt's safety ensured with 15 minute and environmental checks. Pt currently denies SI/HI and A/V hallucinations. Pt verbally agrees to seek staff if SI/HI or A/VH occurs and to consult with staff before acting on these thoughts. Pt refused insulin this morning, states to writer "Nope, I'm not taking the 70/30 or the metformin, none of it." Provider notified. Pt to be discharged today per MD. Will continue to monitor pt for safety and continue POC.

## 2015-06-04 NOTE — Progress Notes (Addendum)
Patient ID: Stephen Herrera, male   DOB: Mar 31, 1955, 60 y.o.   MRN: [id number]  [facility] Post 1:1 Observation Documentation  For the first (8) hours following discontinuation of 1:1 precautions, a progress note entry by nursing staff should be documented at least every 2 hours, reflecting the patient's behavior, condition, mood, and conversation.  Use the progress notes for additional entries.  Time 1:1 discontinued:  09:44am  Patient's Behavior:  Pt currently in no distress, pt standing in hallway not interacting with others.   Patient's Condition:  Pt in no current distress. Respirations is unlabored and even.   Patient's Conversation:  Pt reports that "I just found out that my brother in law was diagnosed with cancer." Pt reports that he will be going to the cafeteria for lunch today.   Elenore Rota 06/04/2015, 12:18 PM

## 2015-06-04 NOTE — Progress Notes (Signed)
Patient ID: Stephen Herrera, male   DOB: 1955/05/17, 60 y.o.   MRN: [id number]  [facility] Post 1:1 Observation Documentation  For the first (8) hours following discontinuation of 1:1 precautions, a progress note entry by nursing staff should be documented at least every 2 hours, reflecting the patient's behavior, condition, mood, and conversation.  Use the progress notes for additional entries.  Time 1:1 discontinued:  09:44am  Patient's Behavior:  Pt currently sitting in dayroom watching the television.   Patient's Condition:  Pt in no current distress.   Patient's Conversation:  Pt verbally agrees to contact staff before acting on any harmful thoughts, pt also verbally confirms that he will contact staff during periods of increased anxiety and anger. Pt denies any dizziness, lightheadedness or pain. Pt also denies SI, HI and AVH. Will continue to monitor, follow POC.   Elenore Rota 06/04/2015, 10:00 AM

## 2015-06-04 NOTE — Progress Notes (Signed)
  [facility] Adult Case Management Discharge Plan :  Will you be returning to the same living situation after discharge:  Yes,  Pt returning home At discharge, do you have transportation home?: Yes,  Pt fiance to pick up Do you have the ability to pay for your medications: Yes,  Pt provided with prescriptions  Release of information consent forms completed and in the chart;  Patient's signature needed at discharge.  Patient to Follow up at: Follow-up Information    Follow up with [facility].   Why:  Staff will contact you today after discharge to schedule your appointment for individual therapy. If you do not receive a call, please contact them at the number below.   Contact information:   [address] [place], [place] [address] Phone: [phone] Fax: [phone]      Follow up with [facility] On 06/29/2015.   Why:  at 11:15am with Iran Sizer for medication management.   Contact information:   [id number] [place] [address] [phone]      Next level of care provider has access to [facility] and Suicide Prevention discussed: Yes,  with fiance; see SPE note for further details  Have you used any form of tobacco in the last 30 days? (Cigarettes, Smokeless Tobacco, Cigars, and/or Pipes): No  Has patient been referred to the Quitline?: N/A patient is not a smoker  Patient has been referred for addiction treatment: N/A  [name] 06/04/2015, 3:08 PM

## 2015-06-09 DIAGNOSIS — R634 Abnormal weight loss: Secondary | ICD-10-CM | POA: Diagnosis not present

## 2015-06-09 DIAGNOSIS — R61 Generalized hyperhidrosis: Secondary | ICD-10-CM | POA: Diagnosis not present

## 2015-06-09 DIAGNOSIS — R197 Diarrhea, unspecified: Secondary | ICD-10-CM | POA: Diagnosis not present

## 2015-06-10 ENCOUNTER — Telehealth: Payer: Self-pay | Admitting: *Deleted

## 2015-06-10 DIAGNOSIS — Z5181 Encounter for therapeutic drug level monitoring: Secondary | ICD-10-CM | POA: Diagnosis not present

## 2015-06-10 DIAGNOSIS — E230 Hypopituitarism: Secondary | ICD-10-CM | POA: Diagnosis not present

## 2015-06-10 DIAGNOSIS — E119 Type 2 diabetes mellitus without complications: Secondary | ICD-10-CM | POA: Diagnosis not present

## 2015-06-10 DIAGNOSIS — Z7984 Long term (current) use of oral hypoglycemic drugs: Secondary | ICD-10-CM | POA: Diagnosis not present

## 2015-06-10 DIAGNOSIS — D751 Secondary polycythemia: Secondary | ICD-10-CM | POA: Diagnosis not present

## 2015-06-10 DIAGNOSIS — K7581 Nonalcoholic steatohepatitis (NASH): Secondary | ICD-10-CM | POA: Diagnosis not present

## 2015-06-10 DIAGNOSIS — Z125 Encounter for screening for malignant neoplasm of prostate: Secondary | ICD-10-CM | POA: Diagnosis not present

## 2015-06-10 NOTE — Telephone Encounter (Signed)
Patient's fiancee, Jocelyn Lamer called to state that patient had gallium 68 test completed at [facility] yesterday. She states they were told that the results would be faxed to the ordering physician that day and would be relayed to them. I advised that we have not gotten any results. In fact, we were not even expecting him to have the test until 06/15/15. Jocelyn Lamer states that the test was "fast tracked." I contacted Prity @ [facility] who states that the test has been completed but is still "preliminary" and has not been signed by the over-reading radiologist. Results will not be faxed until the test has been signed off. I have spoken to Stephen Herrera to advise him of this. He verbalizes understanding.

## 2015-06-11 NOTE — Telephone Encounter (Signed)
I spoke to Prity a few moments ago and was told that the test was still in preliminary mode. I will call back tomorrow. Patient is advised.

## 2015-06-12 ENCOUNTER — Telehealth: Payer: Self-pay | Admitting: Internal Medicine

## 2015-06-12 NOTE — Telephone Encounter (Signed)
Contacted the patient after receiving the results of his recent gallium 58 dotatate PET/CT scan which was performed at [facility] on 06/09/2015 This was to evaluate for possible carcinoid which had remained occult despite previous workup. He's had chronic night sweats, intermittent issues with diarrhea, abnormal weight loss and a previously +5 HIAA test We reviewed the test by phone which was negative. There was "no evidence of gallium dotatate-avid malignancy" He was relieved to hear the results He continues to have issues with weight gain and the night sweats persist He has an appointment early next month with Dr. Annabelle Harman with [facility] for a second opinion regarding his constellation of symptoms I let him know that my office will remain available to him as he goes through this process Time provided for questions and answers He thanked me for the call

## 2015-06-23 DIAGNOSIS — H11153 Pinguecula, bilateral: Secondary | ICD-10-CM | POA: Diagnosis not present

## 2015-06-23 DIAGNOSIS — H2513 Age-related nuclear cataract, bilateral: Secondary | ICD-10-CM | POA: Diagnosis not present

## 2015-06-23 DIAGNOSIS — Z0100 Encounter for examination of eyes and vision without abnormal findings: Secondary | ICD-10-CM | POA: Diagnosis not present

## 2015-06-23 DIAGNOSIS — H16223 Keratoconjunctivitis sicca, not specified as Sjogren's, bilateral: Secondary | ICD-10-CM | POA: Diagnosis not present

## 2015-06-23 DIAGNOSIS — E119 Type 2 diabetes mellitus without complications: Secondary | ICD-10-CM | POA: Diagnosis not present

## 2015-06-23 DIAGNOSIS — Z7984 Long term (current) use of oral hypoglycemic drugs: Secondary | ICD-10-CM | POA: Diagnosis not present

## 2015-06-23 DIAGNOSIS — D3132 Benign neoplasm of left choroid: Secondary | ICD-10-CM | POA: Diagnosis not present

## 2015-06-24 DIAGNOSIS — E230 Hypopituitarism: Secondary | ICD-10-CM | POA: Diagnosis not present

## 2015-06-26 DIAGNOSIS — M4302 Spondylolysis, cervical region: Secondary | ICD-10-CM | POA: Diagnosis not present

## 2015-06-26 DIAGNOSIS — M4716 Other spondylosis with myelopathy, lumbar region: Secondary | ICD-10-CM | POA: Diagnosis not present

## 2015-06-29 DIAGNOSIS — Q822 Mastocytosis: Secondary | ICD-10-CM | POA: Diagnosis not present

## 2015-06-29 DIAGNOSIS — E119 Type 2 diabetes mellitus without complications: Secondary | ICD-10-CM | POA: Diagnosis not present

## 2015-06-29 DIAGNOSIS — D751 Secondary polycythemia: Secondary | ICD-10-CM | POA: Diagnosis not present

## 2015-07-01 DIAGNOSIS — E230 Hypopituitarism: Secondary | ICD-10-CM | POA: Diagnosis not present

## 2015-07-01 DIAGNOSIS — Z7984 Long term (current) use of oral hypoglycemic drugs: Secondary | ICD-10-CM | POA: Diagnosis not present

## 2015-07-01 DIAGNOSIS — E119 Type 2 diabetes mellitus without complications: Secondary | ICD-10-CM | POA: Diagnosis not present

## 2015-07-01 DIAGNOSIS — D751 Secondary polycythemia: Secondary | ICD-10-CM | POA: Diagnosis not present

## 2015-07-06 DIAGNOSIS — D8941 Monoclonal mast cell activation syndrome: Secondary | ICD-10-CM | POA: Diagnosis not present

## 2015-07-06 DIAGNOSIS — Q822 Mastocytosis: Secondary | ICD-10-CM | POA: Diagnosis not present

## 2015-07-06 DIAGNOSIS — E118 Type 2 diabetes mellitus with unspecified complications: Secondary | ICD-10-CM | POA: Diagnosis not present

## 2015-07-07 DIAGNOSIS — H524 Presbyopia: Secondary | ICD-10-CM | POA: Diagnosis not present

## 2015-07-07 DIAGNOSIS — H52223 Regular astigmatism, bilateral: Secondary | ICD-10-CM | POA: Diagnosis not present

## 2015-07-07 DIAGNOSIS — H25813 Combined forms of age-related cataract, bilateral: Secondary | ICD-10-CM | POA: Diagnosis not present

## 2015-07-07 DIAGNOSIS — H04123 Dry eye syndrome of bilateral lacrimal glands: Secondary | ICD-10-CM | POA: Diagnosis not present

## 2015-07-07 DIAGNOSIS — H5203 Hypermetropia, bilateral: Secondary | ICD-10-CM | POA: Diagnosis not present

## 2015-07-15 ENCOUNTER — Other Ambulatory Visit: Payer: Medicare HMO

## 2015-07-15 ENCOUNTER — Ambulatory Visit: Payer: Medicare HMO | Admitting: Hematology

## 2015-07-22 DIAGNOSIS — E230 Hypopituitarism: Secondary | ICD-10-CM | POA: Diagnosis not present

## 2015-08-12 ENCOUNTER — Ambulatory Visit (HOSPITAL_BASED_OUTPATIENT_CLINIC_OR_DEPARTMENT_OTHER): Payer: Medicare HMO | Admitting: Hematology

## 2015-08-12 ENCOUNTER — Telehealth: Payer: Self-pay | Admitting: Hematology

## 2015-08-12 ENCOUNTER — Other Ambulatory Visit (HOSPITAL_BASED_OUTPATIENT_CLINIC_OR_DEPARTMENT_OTHER): Payer: Medicare HMO

## 2015-08-12 VITALS — BP 117/57 | HR 71 | Temp 98.2°F | Resp 18 | Ht 67.0 in | Wt 189.5 lb

## 2015-08-12 DIAGNOSIS — D45 Polycythemia vera: Secondary | ICD-10-CM

## 2015-08-12 DIAGNOSIS — R634 Abnormal weight loss: Secondary | ICD-10-CM

## 2015-08-12 DIAGNOSIS — D751 Secondary polycythemia: Secondary | ICD-10-CM

## 2015-08-12 DIAGNOSIS — R197 Diarrhea, unspecified: Secondary | ICD-10-CM | POA: Diagnosis not present

## 2015-08-12 DIAGNOSIS — E119 Type 2 diabetes mellitus without complications: Secondary | ICD-10-CM | POA: Diagnosis not present

## 2015-08-12 DIAGNOSIS — R61 Generalized hyperhidrosis: Secondary | ICD-10-CM

## 2015-08-12 LAB — COMPREHENSIVE METABOLIC PANEL
ALT: 34 U/L (ref 0–55)
ANION GAP: 12 meq/L — AB (ref 3–11)
AST: 21 U/L (ref 5–34)
Albumin: 4 g/dL (ref 3.5–5.0)
Alkaline Phosphatase: 58 U/L (ref 40–150)
BILIRUBIN TOTAL: 0.52 mg/dL (ref 0.20–1.20)
BUN: 20.8 mg/dL (ref 7.0–26.0)
CHLORIDE: 105 meq/L (ref 98–109)
CO2: 23 meq/L (ref 22–29)
CREATININE: 1 mg/dL (ref 0.7–1.3)
Calcium: 9.2 mg/dL (ref 8.4–10.4)
EGFR: 85 mL/min/{1.73_m2} — AB (ref >90)
GLUCOSE: 174 mg/dL — AB (ref 70–140)
Potassium: 4 mEq/L (ref 3.5–5.1)
SODIUM: 139 meq/L (ref 136–145)
TOTAL PROTEIN: 7.7 g/dL (ref 6.4–8.3)

## 2015-08-12 LAB — CBC & DIFF AND RETIC
BASO%: 0.5 % (ref 0.0–2.0)
Basophils Absolute: 0 10*3/uL (ref 0.0–0.1)
EOS%: 2.6 % (ref 0.0–7.0)
Eosinophils Absolute: 0.2 10*3/uL (ref 0.0–0.5)
HCT: 50.3 % — ABNORMAL HIGH (ref 38.4–49.9)
HEMOGLOBIN: 17 g/dL (ref 13.0–17.1)
Immature Retic Fract: 3.3 % (ref 3.00–10.60)
LYMPH%: 30.2 % (ref 14.0–49.0)
MCH: 30.3 pg (ref 27.2–33.4)
MCHC: 33.8 g/dL (ref 32.0–36.0)
MCV: 89.7 fL (ref 79.3–98.0)
MONO#: 0.5 10*3/uL (ref 0.1–0.9)
MONO%: 6.5 % (ref 0.0–14.0)
NEUT%: 60.2 % (ref 39.0–75.0)
NEUTROS ABS: 4.5 10*3/uL (ref 1.5–6.5)
PLATELETS: 176 10*3/uL (ref 140–400)
RBC: 5.61 10*6/uL (ref 4.20–5.82)
RDW: 13.9 % (ref 11.0–14.6)
Retic %: 1.23 % (ref 0.80–1.80)
Retic Ct Abs: 69 10*3/uL (ref 34.80–93.90)
WBC: 7.4 10*3/uL (ref 4.0–10.3)
lymph#: 2.2 10*3/uL (ref 0.9–3.3)

## 2015-08-12 LAB — TSH: TSH: 4.158 m[IU]/L — AB (ref 0.320–4.118)

## 2015-08-12 LAB — FERRITIN: Ferritin: 82 ng/ml (ref 22–316)

## 2015-08-12 NOTE — Telephone Encounter (Signed)
Gave and printed appts ched and avs for pt for DEC  °
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
## 2015-08-19 DIAGNOSIS — R69 Illness, unspecified: Secondary | ICD-10-CM | POA: Diagnosis not present

## 2015-09-23 DIAGNOSIS — E236 Other disorders of pituitary gland: Secondary | ICD-10-CM | POA: Diagnosis not present

## 2015-10-14 DIAGNOSIS — R634 Abnormal weight loss: Secondary | ICD-10-CM | POA: Diagnosis not present

## 2015-10-14 DIAGNOSIS — Z7984 Long term (current) use of oral hypoglycemic drugs: Secondary | ICD-10-CM | POA: Diagnosis not present

## 2015-10-14 DIAGNOSIS — E291 Testicular hypofunction: Secondary | ICD-10-CM | POA: Diagnosis not present

## 2015-10-14 DIAGNOSIS — R69 Illness, unspecified: Secondary | ICD-10-CM | POA: Diagnosis not present

## 2015-10-14 DIAGNOSIS — E782 Mixed hyperlipidemia: Secondary | ICD-10-CM | POA: Diagnosis not present

## 2015-10-14 DIAGNOSIS — E119 Type 2 diabetes mellitus without complications: Secondary | ICD-10-CM | POA: Diagnosis not present

## 2015-10-14 DIAGNOSIS — I10 Essential (primary) hypertension: Secondary | ICD-10-CM | POA: Diagnosis not present

## 2015-10-14 DIAGNOSIS — E230 Hypopituitarism: Secondary | ICD-10-CM | POA: Diagnosis not present

## 2015-10-14 DIAGNOSIS — L749 Eccrine sweat disorder, unspecified: Secondary | ICD-10-CM | POA: Diagnosis not present

## 2015-10-20 DIAGNOSIS — M4302 Spondylolysis, cervical region: Secondary | ICD-10-CM | POA: Diagnosis not present

## 2015-10-20 DIAGNOSIS — M4716 Other spondylosis with myelopathy, lumbar region: Secondary | ICD-10-CM | POA: Diagnosis not present

## 2015-10-21 DIAGNOSIS — D751 Secondary polycythemia: Secondary | ICD-10-CM | POA: Diagnosis not present

## 2015-10-21 DIAGNOSIS — K7581 Nonalcoholic steatohepatitis (NASH): Secondary | ICD-10-CM | POA: Diagnosis not present

## 2015-10-21 DIAGNOSIS — Z6831 Body mass index (BMI) 31.0-31.9, adult: Secondary | ICD-10-CM | POA: Diagnosis not present

## 2015-10-21 DIAGNOSIS — E119 Type 2 diabetes mellitus without complications: Secondary | ICD-10-CM | POA: Diagnosis not present

## 2015-10-21 DIAGNOSIS — E230 Hypopituitarism: Secondary | ICD-10-CM | POA: Diagnosis not present

## 2015-10-21 DIAGNOSIS — E663 Overweight: Secondary | ICD-10-CM | POA: Diagnosis not present

## 2015-10-21 DIAGNOSIS — Z125 Encounter for screening for malignant neoplasm of prostate: Secondary | ICD-10-CM | POA: Diagnosis not present

## 2015-10-21 DIAGNOSIS — Z5181 Encounter for therapeutic drug level monitoring: Secondary | ICD-10-CM | POA: Diagnosis not present

## 2015-10-28 DIAGNOSIS — E782 Mixed hyperlipidemia: Secondary | ICD-10-CM | POA: Diagnosis not present

## 2016-01-08 DIAGNOSIS — E230 Hypopituitarism: Secondary | ICD-10-CM | POA: Diagnosis not present

## 2016-01-08 DIAGNOSIS — D751 Secondary polycythemia: Secondary | ICD-10-CM | POA: Diagnosis not present

## 2016-01-08 DIAGNOSIS — E119 Type 2 diabetes mellitus without complications: Secondary | ICD-10-CM | POA: Diagnosis not present

## 2016-01-11 ENCOUNTER — Encounter (HOSPITAL_COMMUNITY): Payer: Self-pay | Admitting: Emergency Medicine

## 2016-01-11 ENCOUNTER — Emergency Department (HOSPITAL_COMMUNITY)
Admission: EM | Admit: 2016-01-11 | Discharge: 2016-01-11 | Disposition: A | Discharge status: Home / Self Care | Payer: Medicare HMO | Source: Home / Self Care | Attending: Emergency Medicine | Admitting: Emergency Medicine

## 2016-01-11 ENCOUNTER — Emergency Department (HOSPITAL_COMMUNITY)
Admission: EM | Admit: 2016-01-11 | Discharge: 2016-01-12 | Disposition: A | Discharge status: Home / Self Care | Payer: Medicare HMO | Attending: Emergency Medicine | Admitting: Emergency Medicine

## 2016-01-11 DIAGNOSIS — F410 Panic disorder [episodic paroxysmal anxiety] without agoraphobia: Secondary | ICD-10-CM | POA: Insufficient documentation

## 2016-01-11 DIAGNOSIS — Z87891 Personal history of nicotine dependence: Secondary | ICD-10-CM | POA: Insufficient documentation

## 2016-01-11 DIAGNOSIS — J449 Chronic obstructive pulmonary disease, unspecified: Secondary | ICD-10-CM

## 2016-01-11 DIAGNOSIS — E119 Type 2 diabetes mellitus without complications: Secondary | ICD-10-CM | POA: Insufficient documentation

## 2016-01-11 DIAGNOSIS — I2510 Atherosclerotic heart disease of native coronary artery without angina pectoris: Secondary | ICD-10-CM | POA: Insufficient documentation

## 2016-01-11 DIAGNOSIS — F419 Anxiety disorder, unspecified: Secondary | ICD-10-CM | POA: Diagnosis present

## 2016-01-11 DIAGNOSIS — Z7984 Long term (current) use of oral hypoglycemic drugs: Secondary | ICD-10-CM | POA: Insufficient documentation

## 2016-01-11 DIAGNOSIS — Z7982 Long term (current) use of aspirin: Secondary | ICD-10-CM

## 2016-01-11 DIAGNOSIS — F909 Attention-deficit hyperactivity disorder, unspecified type: Secondary | ICD-10-CM | POA: Insufficient documentation

## 2016-01-11 DIAGNOSIS — E039 Hypothyroidism, unspecified: Secondary | ICD-10-CM

## 2016-01-11 DIAGNOSIS — Z79899 Other long term (current) drug therapy: Secondary | ICD-10-CM | POA: Insufficient documentation

## 2016-01-11 DIAGNOSIS — F332 Major depressive disorder, recurrent severe without psychotic features: Secondary | ICD-10-CM | POA: Diagnosis present

## 2016-01-11 DIAGNOSIS — Z9889 Other specified postprocedural states: Secondary | ICD-10-CM | POA: Diagnosis not present

## 2016-01-11 DIAGNOSIS — I10 Essential (primary) hypertension: Secondary | ICD-10-CM | POA: Insufficient documentation

## 2016-01-11 DIAGNOSIS — Z801 Family history of malignant neoplasm of trachea, bronchus and lung: Secondary | ICD-10-CM | POA: Diagnosis not present

## 2016-01-11 HISTORY — DX: Anxiety disorder, unspecified: F41.9

## 2016-01-11 LAB — CBC WITH DIFFERENTIAL/PLATELET
Basophils Absolute: 0 10*3/uL (ref 0.0–0.1)
Basophils Relative: 0 %
Eosinophils Absolute: 0.2 10*3/uL (ref 0.0–0.7)
Eosinophils Relative: 3 %
HEMATOCRIT: 44.7 % (ref 39.0–52.0)
HEMOGLOBIN: 15.6 g/dL (ref 13.0–17.0)
LYMPHS ABS: 3 10*3/uL (ref 0.7–4.0)
Lymphocytes Relative: 38 %
MCH: 31 pg (ref 26.0–34.0)
MCHC: 34.9 g/dL (ref 30.0–36.0)
MCV: 88.7 fL (ref 78.0–100.0)
MONOS PCT: 7 %
Monocytes Absolute: 0.6 10*3/uL (ref 0.1–1.0)
NEUTROS ABS: 4 10*3/uL (ref 1.7–7.7)
NEUTROS PCT: 52 %
Platelets: 224 10*3/uL (ref 150–400)
RBC: 5.04 MIL/uL (ref 4.22–5.81)
RDW: 12.6 % (ref 11.5–15.5)
WBC: 7.8 10*3/uL (ref 4.0–10.5)
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
## 2016-01-11 NOTE — Discharge Instructions (Signed)
Return if concern for any reason or contact Dr. Laurann Montana

## 2016-01-11 NOTE — ED Triage Notes (Signed)
Pt c/o panic attack onset 2:30 today. Pt states he has these but this is different because he cannot control it. Pt usually can do breathing techniques and talk himself out of them. Pt has taken xanax but no relief yet. Pt states started when attempting to take a urine drug test, had difficulty and urinated on himself. Pt very anxious. Pt denies SI/HI.
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
## 2016-01-11 NOTE — ED Triage Notes (Signed)
Pt reports to ER c/o panic attack around 7pm; states this was his second panic attack today; pt was seen earlier today for the first panic attack; pt attributed second panic attack to telling his wife about the first one; states second panic attack was worse than first; has prescription for Xanax but denies other psychiatric diagnoses; calm and cooperative at present

## 2016-01-12 DIAGNOSIS — F410 Panic disorder [episodic paroxysmal anxiety] without agoraphobia: Secondary | ICD-10-CM

## 2016-01-12 DIAGNOSIS — Z800 Family history of malignant neoplasm of digestive organs: Secondary | ICD-10-CM

## 2016-01-12 DIAGNOSIS — F332 Major depressive disorder, recurrent severe without psychotic features: Secondary | ICD-10-CM | POA: Diagnosis not present

## 2016-01-12 DIAGNOSIS — Z801 Family history of malignant neoplasm of trachea, bronchus and lung: Secondary | ICD-10-CM | POA: Diagnosis not present

## 2016-01-12 DIAGNOSIS — Z833 Family history of diabetes mellitus: Secondary | ICD-10-CM

## 2016-01-12 DIAGNOSIS — Z8349 Family history of other endocrine, nutritional and metabolic diseases: Secondary | ICD-10-CM

## 2016-01-12 DIAGNOSIS — Z8371 Family history of colonic polyps: Secondary | ICD-10-CM

## 2016-01-12 DIAGNOSIS — Z9889 Other specified postprocedural states: Secondary | ICD-10-CM | POA: Diagnosis not present

## 2016-01-12 DIAGNOSIS — Z87891 Personal history of nicotine dependence: Secondary | ICD-10-CM

## 2016-01-12 LAB — COMPREHENSIVE METABOLIC PANEL
ALBUMIN: 4.4 g/dL (ref 3.5–5.0)
ALK PHOS: 50 U/L (ref 38–126)
ALT: 21 U/L (ref 17–63)
AST: 23 U/L (ref 15–41)
Anion gap: 9 (ref 5–15)
BILIRUBIN TOTAL: 0.5 mg/dL (ref 0.3–1.2)
BUN: 17 mg/dL (ref 6–20)
CO2: 23 mmol/L (ref 22–32)
Calcium: 9.3 mg/dL (ref 8.9–10.3)
Chloride: 109 mmol/L (ref 101–111)
Creatinine, Ser: 0.98 mg/dL (ref 0.61–1.24)
GFR calc Af Amer: >60 mL/min (ref >60)
GFR calc non Af Amer: >60 mL/min (ref >60)
GLUCOSE: 133 mg/dL — AB (ref 65–99)
POTASSIUM: 3.6 mmol/L (ref 3.5–5.1)
Sodium: 141 mmol/L (ref 135–145)
TOTAL PROTEIN: 7.6 g/dL (ref 6.5–8.1)

## 2016-01-12 LAB — ETHANOL: Alcohol, Ethyl (B): <5 mg/dL (ref <5)

## 2016-01-12 LAB — RAPID URINE DRUG SCREEN, HOSP PERFORMED
Amphetamines: NOT DETECTED
BARBITURATES: NOT DETECTED
BENZODIAZEPINES: POSITIVE — AB
Cocaine: NOT DETECTED
Opiates: NOT DETECTED
Tetrahydrocannabinol: POSITIVE — AB

## 2016-01-12 MED ORDER — HYDROXYZINE HCL 25 MG PO TABS
25.0000 mg | ORAL_TABLET | Freq: Three times a day (TID) | ORAL | Status: DC | PRN
  Filled 2016-01-12: qty 1

## 2016-01-12 MED ORDER — SERTRALINE HCL 50 MG PO TABS
50.0000 mg | ORAL_TABLET | Freq: Every day | ORAL | Status: DC
  Filled 2016-01-12: qty 1

## 2016-01-12 MED ORDER — TRAZODONE HCL 100 MG PO TABS: 100.0000 mg | ORAL_TABLET | Freq: Every evening | ORAL | 0 refills | Status: DC | PRN

## 2016-01-12 MED ORDER — TRAZODONE HCL 100 MG PO TABS
100.0000 mg | ORAL_TABLET | Freq: Every evening | ORAL | Status: DC | PRN
Start: 2016-01-12 — End: 2016-01-12

## 2016-01-12 MED ORDER — HYDROXYZINE HCL 25 MG PO TABS: 25.0000 mg | ORAL_TABLET | Freq: Three times a day (TID) | ORAL | 0 refills | Status: DC | PRN

## 2016-01-12 MED ORDER — SERTRALINE HCL 50 MG PO TABS: 50.0000 mg | ORAL_TABLET | Freq: Every day | ORAL | 0 refills | Status: DC
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
## 2016-01-12 NOTE — [facility] Suicide Risk Assessment (Cosign Needed)
[facility] Her[facility] Discharge Suicide Risk Assessment   Principal Problem: Panic disorder without agoraphobia Discharge Diagnoses:  Patient Active Problem List   Diagnosis Date Noted  . Panic disorder without agoraphobia [F41.0] 01/12/2016    Priority: High  . MDD (major depressive disorder), recurrent severe, without psychosis ([place]) [F33.2]     Priority: High  . DM type 2 goal A1C below 7.5 [E11.9]   . GAD (generalized anxiety disorder) [F41.1] 05/28/2015  . MDD (major depressive disorder), recurrent episode, severe ([place]) [F33.2] 05/28/2015  . Suicidal ideation [R45.851]   . Polycythemia, secondary [D75.1] 03/31/2015  . Carcinoid syndrome ([place]) [E34.0] 02/26/2015  . Diarrhea [R19.7] 02/26/2015  . Abdominal pain [R10.9] 09/16/2014  . Polycythemia [D75.1] 09/16/2014  . Chest pain [R07.9] 09/13/2014  . Erythrocytosis [D75.1] 09/13/2014  . Syncope [R55] 09/13/2014  . Hyperhidrosis [L74.519] 04/30/2014  . Benign positional vertigo [H81.10] 10/26/2011  . Rash [R21] 10/04/2011  . Night sweats [R61] 06/03/2011  . Anxiety [F41.9] 01/26/2011  . OSA (obstructive sleep apnea) [G47.33] 08/09/2010  . DM (diabetes mellitus) ([place]) [E11.9] 06/03/2010  . Preventative health care [Z00.00] 06/03/2010  . Urinary retention with incomplete bladder emptying [R33.9] 06/03/2010  . COPD (chronic obstructive pulmonary disease) ([place]) [J44.9]   . IBS (irritable bowel syndrome) [K58.9]   . Chronic fatigue syndrome [R53.82]   . Chronic pain [G89.29]   . ADD (attention deficit disorder) [F98.8]   . Gout [274]   . Hyperlipidemia [E78.5]   . CAD (coronary atherosclerotic disease) [I25.10]   . Hypogonadism male [E29.1]   . Hypothyroidism [E03.9]   . Chest pain syndrome [R07.9]   . Allergic rhinitis, seasonal [J30.2]   . History of colonoscopy with polypectomy [Z98.890, Z86.010]     Total Time spent with patient: 25 minutes  Musculoskeletal: Strength & Muscle Tone: within normal limits Gait & Station: normal Patient  leans: N/A  Psychiatric Specialty Exam:   Blood pressure 113/95, pulse 87, temperature 98.2 F (36.8 C), temperature source Oral, resp. rate 16, SpO2 96 %.There is no height or weight on file to calculate BMI.   General Appearance: Casual and Fairly Groomed  Eye Contact:  Good  Speech:  Clear and Coherent and Normal Rate  Volume:  Normal  Mood:  Anxious  Affect:  Appropriate, Congruent and very anxious  Thought Process:  Coherent, Goal Directed, Linear and Descriptions of Associations: Intact  Orientation:  Full (Time, Place, and Person)  Thought Content:  Symptoms, worries, concerns, rumination about drug testing, medical marijuana, "bomb threat", probation, etc.  Suicidal Thoughts:  No  Homicidal Thoughts:  No  Memory:  Immediate;   Fair Recent;   Fair Remote;   Fair  Judgement:  Fair  Insight:  Fair  Psychomotor Activity:  Normal  Concentration:  Concentration: Fair and Attention Span: Fair  Recall:  [employer] of Knowledge:  Fair  Language:  Fair  Akathisia:  No  Handed:    AIMS (if indicated):     Assets:  Communication Skills Desire for Improvement Leisure Time Resilience  ADL's:  Intact  Cognition:  WNL  Sleep:       Demographic Factors:  Male and Caucasian  Loss Factors: Decline in physical health and Legal issues  Historical Factors: Impulsivity  Risk Reduction Factors:   Positive therapeutic relationship  Continued Clinical Symptoms:  Panic Attacks Depression:   Insomnia  Cognitive Features That Contribute To Risk:  Polarized thinking    Suicide Risk:  Minimal: No identifiable suicidal ideation.  Patients presenting with no risk factors  but with morbid ruminations; may be classified as minimal risk based on the severity of the depressive symptoms  Follow-up Information    Irven Shelling, MD.   Specialty:  Internal Medicine Contact information: [address] [place] [address] [phone]           Plan Of  Care/Follow-up recommendations:  Activity:  As tolerated Diet:  Heart healthy with low sodium  Benjamine Mola, FNP 01/12/2016, 12:43 PM

## 2016-01-12 NOTE — ED Notes (Addendum)
Pt came up to nurses station, sts "I will be back I am going to cafeteria to get lunch". Attempted to explain to pt that lunch tray has been ordered for him, however he refused to stop and listen. Pt walked away while this nurse was still talking to him. Per Heloise Purpura, psych PA pt is soon to be discharged.
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
## 2016-01-12 NOTE — ED Notes (Signed)
Pt now states he called Jeanella Craze, "your superior" and he is sending someone to talk to him.

## 2016-01-12 NOTE — ED Notes (Signed)
Pt stated he is unable to give a urine sample 

## 2016-01-12 NOTE — ED Provider Notes (Signed)
Psych has cleared patient for discharge. They have written prescriptions, he is to follow-up with his PCP. No SI/HI. Feels well enough to go home.   Sherwood Gambler, MD 01/12/16 [phone]

## 2016-01-12 NOTE — ED Notes (Signed)
Pt asked to provide urine sample two times and he states that at this time he is unable to do so

## 2016-01-12 NOTE — ED Notes (Signed)
Pt returned.

## 2016-01-12 NOTE — ED Notes (Signed)
Telepsych evaluation in process.

## 2016-01-12 NOTE — ED Notes (Signed)
Pt sts was told he will be discharge soon, he wants to know "exact time so I can plan accordingly". Explained to pt that this nurse, unfortunately can't give him exact time since TTS team hasn't finished their rounds  and pt is not up for discharge yet. Pt also sts he wants to know the results of all tests that were done during this visit. Dr Verta Ellen made aware

## 2016-01-19 ENCOUNTER — Ambulatory Visit
Admission: RE | Admit: 2016-01-19 | Discharge: 2016-01-19 | Disposition: A | Discharge status: Home / Self Care | Payer: Medicare HMO | Source: Ambulatory Visit | Attending: Internal Medicine | Admitting: Internal Medicine

## 2016-01-19 ENCOUNTER — Other Ambulatory Visit: Payer: Self-pay | Admitting: Internal Medicine

## 2016-01-19 DIAGNOSIS — R4701 Aphasia: Secondary | ICD-10-CM | POA: Diagnosis not present

## 2016-01-19 DIAGNOSIS — R130 Aphagia: Secondary | ICD-10-CM

## 2016-01-26 DIAGNOSIS — R251 Tremor, unspecified: Secondary | ICD-10-CM | POA: Diagnosis not present

## 2016-01-26 DIAGNOSIS — R4701 Aphasia: Secondary | ICD-10-CM | POA: Diagnosis not present

## 2016-02-01 DIAGNOSIS — R69 Illness, unspecified: Secondary | ICD-10-CM | POA: Diagnosis not present

## 2016-02-02 ENCOUNTER — Encounter: Payer: Self-pay | Admitting: Hematology

## 2016-02-02 ENCOUNTER — Ambulatory Visit (HOSPITAL_BASED_OUTPATIENT_CLINIC_OR_DEPARTMENT_OTHER): Payer: Medicare HMO | Admitting: Hematology

## 2016-02-02 ENCOUNTER — Other Ambulatory Visit (HOSPITAL_BASED_OUTPATIENT_CLINIC_OR_DEPARTMENT_OTHER): Payer: Medicare HMO

## 2016-02-02 VITALS — BP 124/62 | HR 83 | Temp 98.1°F | Resp 18 | Ht 67.0 in | Wt 180.5 lb

## 2016-02-02 DIAGNOSIS — D751 Secondary polycythemia: Secondary | ICD-10-CM

## 2016-02-02 DIAGNOSIS — R634 Abnormal weight loss: Secondary | ICD-10-CM | POA: Diagnosis not present

## 2016-02-02 DIAGNOSIS — D45 Polycythemia vera: Secondary | ICD-10-CM

## 2016-02-02 DIAGNOSIS — R197 Diarrhea, unspecified: Secondary | ICD-10-CM | POA: Diagnosis not present

## 2016-02-02 LAB — COMPREHENSIVE METABOLIC PANEL
ALT: 24 U/L (ref 0–55)
ANION GAP: 10 meq/L (ref 3–11)
AST: 18 U/L (ref 5–34)
Albumin: 3.8 g/dL (ref 3.5–5.0)
Alkaline Phosphatase: 66 U/L (ref 40–150)
BILIRUBIN TOTAL: 0.4 mg/dL (ref 0.20–1.20)
BUN: 16.8 mg/dL (ref 7.0–26.0)
CHLORIDE: 105 meq/L (ref 98–109)
CO2: 25 meq/L (ref 22–29)
Calcium: 9.3 mg/dL (ref 8.4–10.4)
Creatinine: 1 mg/dL (ref 0.7–1.3)
EGFR: 82 mL/min/{1.73_m2} — AB (ref >90)
GLUCOSE: 195 mg/dL — AB (ref 70–140)
POTASSIUM: 4.4 meq/L (ref 3.5–5.1)
SODIUM: 140 meq/L (ref 136–145)
TOTAL PROTEIN: 7.3 g/dL (ref 6.4–8.3)

## 2016-02-02 LAB — CBC & DIFF AND RETIC
BASO%: 0.5 % (ref 0.0–2.0)
Basophils Absolute: 0 10*3/uL (ref 0.0–0.1)
EOS%: 2 % (ref 0.0–7.0)
Eosinophils Absolute: 0.1 10*3/uL (ref 0.0–0.5)
HCT: 45 % (ref 38.4–49.9)
HGB: 15.5 g/dL (ref 13.0–17.1)
IMMATURE RETIC FRACT: 1.6 % — AB (ref 3.00–10.60)
LYMPH%: 29.9 % (ref 14.0–49.0)
MCH: 30.5 pg (ref 27.2–33.4)
MCHC: 34.4 g/dL (ref 32.0–36.0)
MCV: 88.4 fL (ref 79.3–98.0)
MONO#: 0.6 10*3/uL (ref 0.1–0.9)
MONO%: 9.7 % (ref 0.0–14.0)
NEUT%: 57.9 % (ref 39.0–75.0)
NEUTROS ABS: 3.4 10*3/uL (ref 1.5–6.5)
PLATELETS: 206 10*3/uL (ref 140–400)
RBC: 5.09 10*6/uL (ref 4.20–5.82)
RDW: 12.8 % (ref 11.0–14.6)
Retic %: 1.04 % (ref 0.80–1.80)
Retic Ct Abs: 52.94 10*3/uL (ref 34.80–93.90)
WBC: 5.9 10*3/uL (ref 4.0–10.3)
lymph#: 1.8 10*3/uL (ref 0.9–3.3)

## 2016-02-09 DIAGNOSIS — R69 Illness, unspecified: Secondary | ICD-10-CM | POA: Diagnosis not present

## 2016-02-11 ENCOUNTER — Encounter: Payer: Self-pay | Admitting: Neurology

## 2016-02-11 ENCOUNTER — Ambulatory Visit (INDEPENDENT_AMBULATORY_CARE_PROVIDER_SITE_OTHER): Payer: Medicare HMO | Admitting: Neurology

## 2016-02-11 VITALS — BP 107/59 | HR 74 | Resp 18 | Ht 67.0 in | Wt 180.0 lb

## 2016-02-11 DIAGNOSIS — R55 Syncope and collapse: Secondary | ICD-10-CM | POA: Diagnosis not present

## 2016-02-11 NOTE — Patient Instructions (Signed)
   We will check an EEG study. 
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
## 2016-02-18 DIAGNOSIS — R69 Illness, unspecified: Secondary | ICD-10-CM | POA: Diagnosis not present
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
## 2016-02-24 DIAGNOSIS — R634 Abnormal weight loss: Secondary | ICD-10-CM | POA: Diagnosis not present

## 2016-02-24 DIAGNOSIS — R61 Generalized hyperhidrosis: Secondary | ICD-10-CM | POA: Diagnosis not present

## 2016-02-24 DIAGNOSIS — R69 Illness, unspecified: Secondary | ICD-10-CM | POA: Diagnosis not present

## 2016-02-25 DIAGNOSIS — R69 Illness, unspecified: Secondary | ICD-10-CM | POA: Diagnosis not present

## 2016-03-22 ENCOUNTER — Other Ambulatory Visit: Payer: Medicare HMO

## 2016-04-05 DIAGNOSIS — M5020 Other cervical disc displacement, unspecified cervical region: Secondary | ICD-10-CM | POA: Diagnosis not present

## 2016-04-05 DIAGNOSIS — M4302 Spondylolysis, cervical region: Secondary | ICD-10-CM | POA: Diagnosis not present

## 2016-04-19 ENCOUNTER — Other Ambulatory Visit: Payer: Self-pay | Admitting: Internal Medicine

## 2016-04-19 DIAGNOSIS — R69 Illness, unspecified: Secondary | ICD-10-CM | POA: Diagnosis not present

## 2016-04-19 DIAGNOSIS — Z1389 Encounter for screening for other disorder: Secondary | ICD-10-CM | POA: Diagnosis not present

## 2016-04-19 DIAGNOSIS — L749 Eccrine sweat disorder, unspecified: Secondary | ICD-10-CM | POA: Diagnosis not present

## 2016-04-19 DIAGNOSIS — Z0000 Encounter for general adult medical examination without abnormal findings: Secondary | ICD-10-CM | POA: Diagnosis not present

## 2016-04-19 DIAGNOSIS — E782 Mixed hyperlipidemia: Secondary | ICD-10-CM | POA: Diagnosis not present

## 2016-04-19 DIAGNOSIS — E119 Type 2 diabetes mellitus without complications: Secondary | ICD-10-CM | POA: Diagnosis not present

## 2016-04-19 DIAGNOSIS — F17200 Nicotine dependence, unspecified, uncomplicated: Secondary | ICD-10-CM

## 2016-04-19 DIAGNOSIS — Z7984 Long term (current) use of oral hypoglycemic drugs: Secondary | ICD-10-CM | POA: Diagnosis not present

## 2016-04-20 ENCOUNTER — Ambulatory Visit
Admission: RE | Admit: 2016-04-20 | Discharge: 2016-04-20 | Disposition: A | Discharge status: Home / Self Care | Payer: Medicare HMO | Source: Ambulatory Visit | Attending: Internal Medicine | Admitting: Internal Medicine

## 2016-04-20 DIAGNOSIS — F17200 Nicotine dependence, unspecified, uncomplicated: Secondary | ICD-10-CM

## 2016-04-20 DIAGNOSIS — Z87891 Personal history of nicotine dependence: Secondary | ICD-10-CM | POA: Diagnosis not present

## 2016-04-27 DIAGNOSIS — K7581 Nonalcoholic steatohepatitis (NASH): Secondary | ICD-10-CM | POA: Diagnosis not present

## 2016-04-27 DIAGNOSIS — Z5181 Encounter for therapeutic drug level monitoring: Secondary | ICD-10-CM | POA: Diagnosis not present

## 2016-04-27 DIAGNOSIS — Z13820 Encounter for screening for osteoporosis: Secondary | ICD-10-CM | POA: Diagnosis not present

## 2016-04-27 DIAGNOSIS — D751 Secondary polycythemia: Secondary | ICD-10-CM | POA: Diagnosis not present

## 2016-04-27 DIAGNOSIS — R69 Illness, unspecified: Secondary | ICD-10-CM | POA: Diagnosis not present

## 2016-04-27 DIAGNOSIS — Z7984 Long term (current) use of oral hypoglycemic drugs: Secondary | ICD-10-CM | POA: Diagnosis not present

## 2016-04-27 DIAGNOSIS — E230 Hypopituitarism: Secondary | ICD-10-CM | POA: Diagnosis not present

## 2016-04-27 DIAGNOSIS — Z125 Encounter for screening for malignant neoplasm of prostate: Secondary | ICD-10-CM | POA: Diagnosis not present

## 2016-04-27 DIAGNOSIS — E119 Type 2 diabetes mellitus without complications: Secondary | ICD-10-CM | POA: Diagnosis not present

## 2016-05-23 DIAGNOSIS — E230 Hypopituitarism: Secondary | ICD-10-CM | POA: Diagnosis not present

## 2016-06-02 IMAGING — MR MR LUMBAR SPINE W/O CM
4 of 5 series · 22 of 48 positions shown · non-contrast
Comparison: MRI 02/03/2013

CLINICAL DATA: Acute right-sided low back pain extending to the
right buttock. Four weeks duration.

EXAM:
MRI LUMBAR SPINE WITHOUT CONTRAST
TECHNIQUE: Multiplanar, multisequence MR imaging of the lumbar spine was
performed. No intravenous contrast was administered.

[Series 7: T1 · sagittal · 4.0mm · 0.73mm/px · 4 of 16 slices shown (1 of 2)]
[im 1/16]
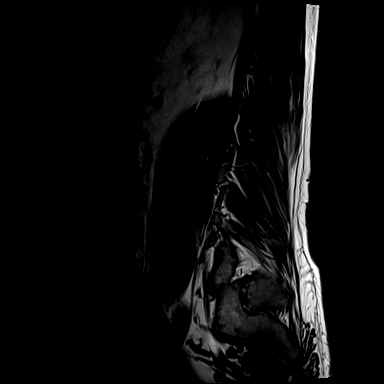
[im 3/16]
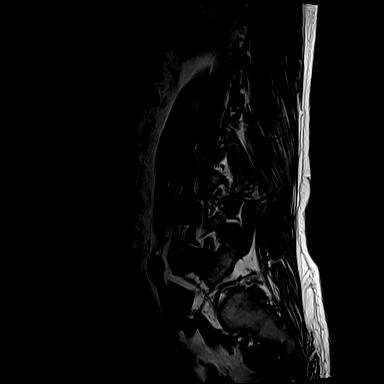
[im 8/16]
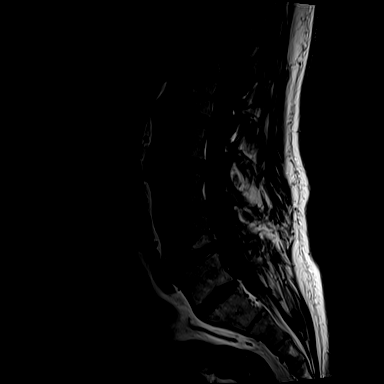
[im 13/16]
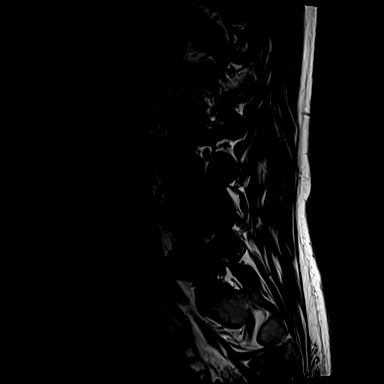

[Series 8: T2 · sagittal · 4.0mm · 0.73mm/px · 7 of 16 slices shown (1 of 2)]
[im 1/16]
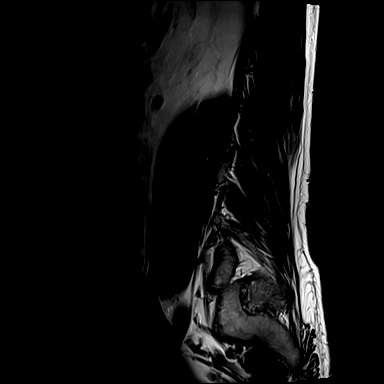
[im 3/16]
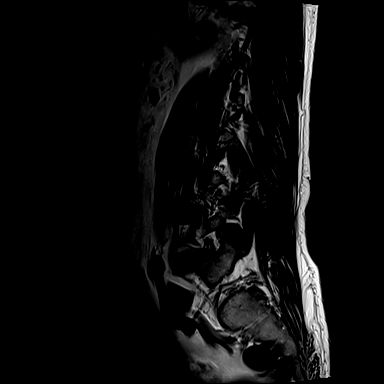
[im 6/16]
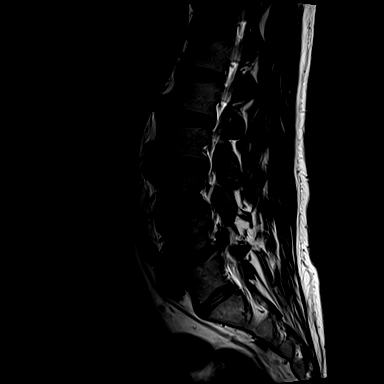
[im 8/16]
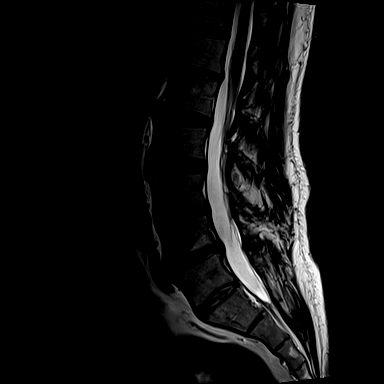
[im 11/16]
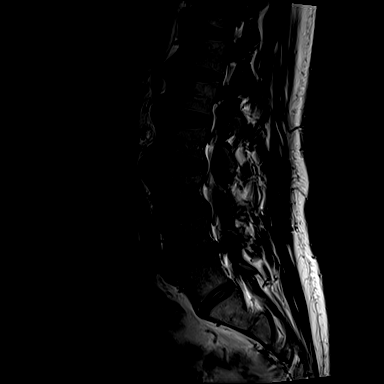
[im 13/16]
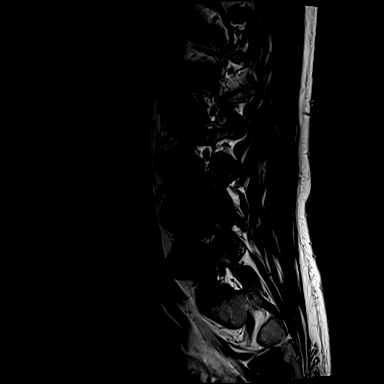
[im 16/16]
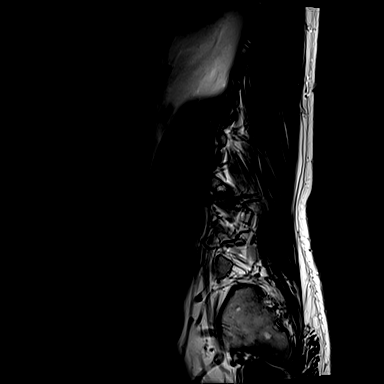

[Series 10: T2 · axial · 4.0mm · 0.28mm/px · z∈[-107,+70]mm · 8 of 31 slices shown (2 of 2)]
[im 1/31]
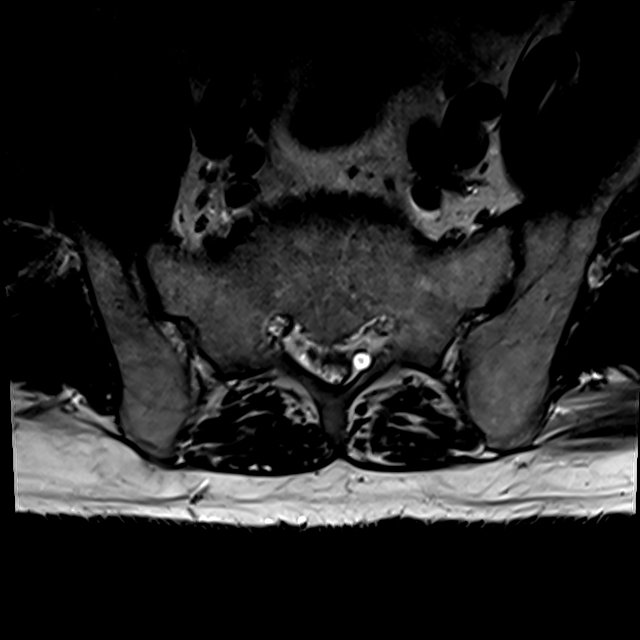
[im 5/31]
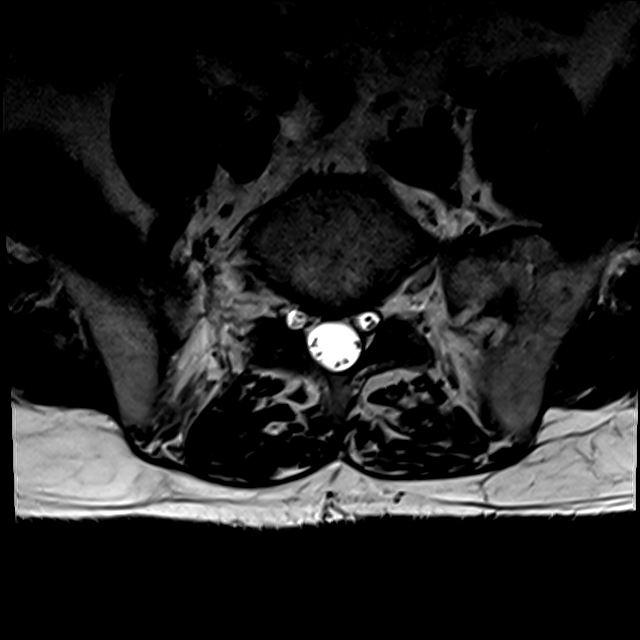
[im 10/31]
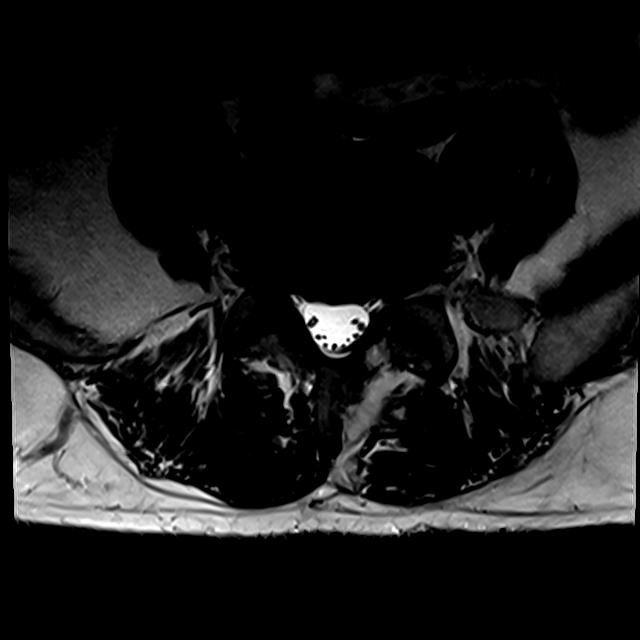
[im 14/31]
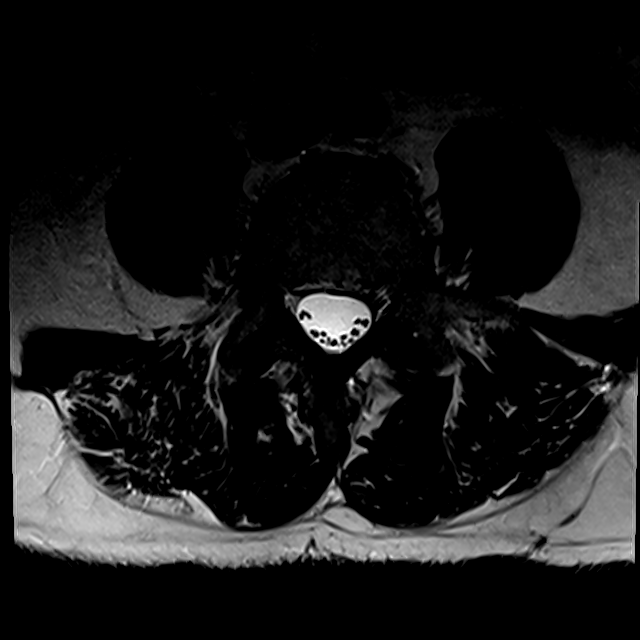
[im 17/31]
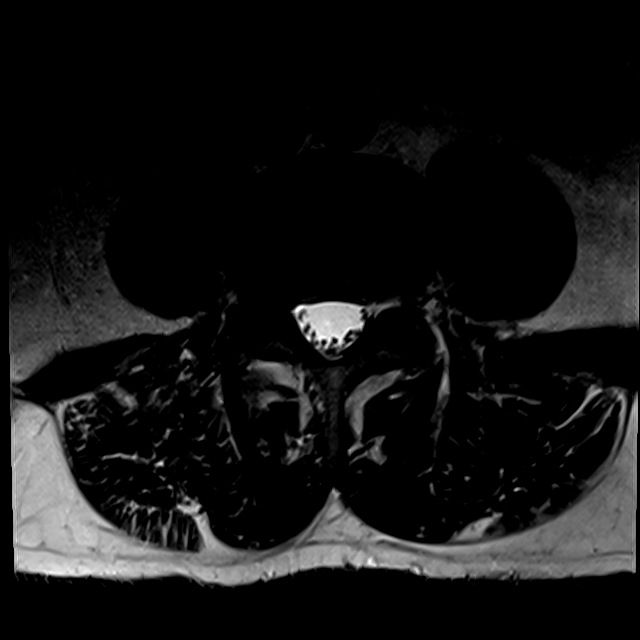
[im 21/31]
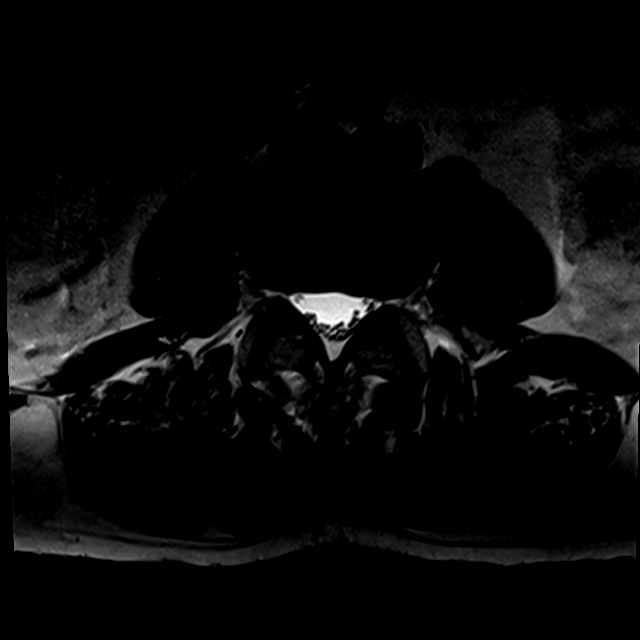
[im 26/31]
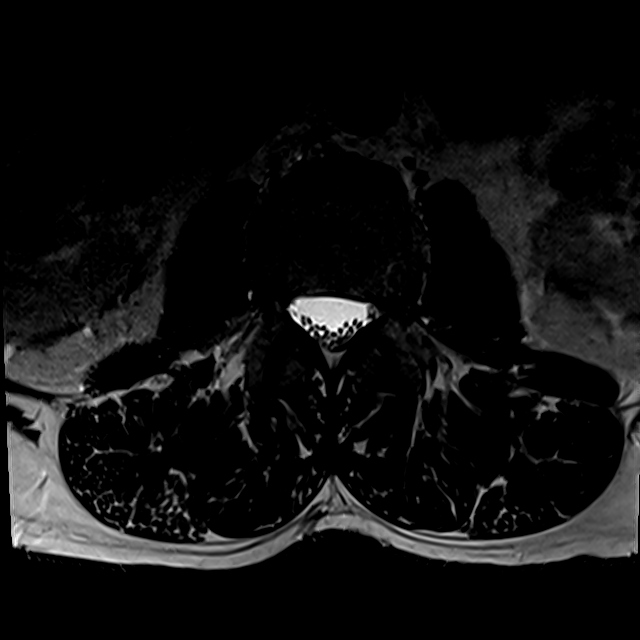
[im 31/31]
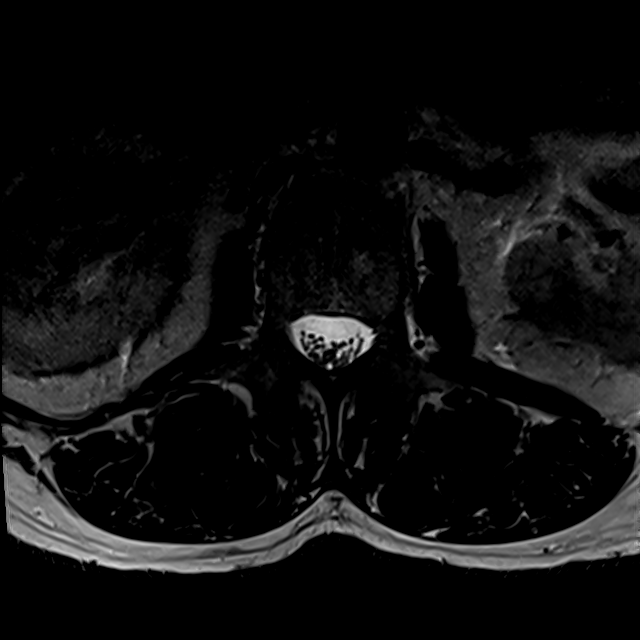

[Series 11: T1 · axial · 4.0mm · 0.56mm/px · z∈[-88,+45]mm · 3 of 31 slices shown (2 of 2)]
[im 5/31]
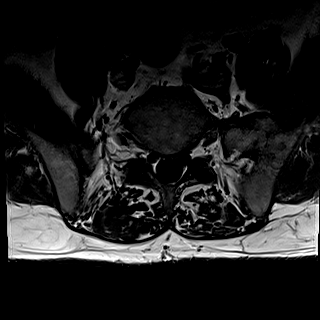
[im 17/31]
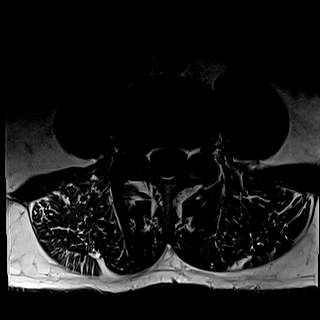
[im 26/31]
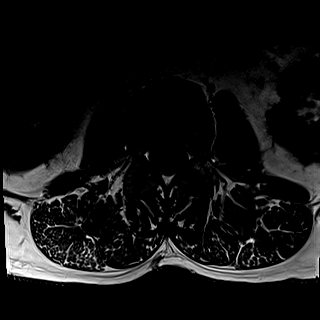

[22 of 48 positions shown; findings below may reference images not displayed]

FINDINGS: There is no evidence of metastatic disease in the region.

T11-12: Normal.

T12-L1: Shallow central disc protrusion indents the ventral
subarachnoid space but does not affect the neural structures. The
distal cord and conus are normal with conus tip at L1.

L1-2: Minimal bulging of the disc. Facet hypertrophy worse on the
right. No central canal stenosis. Mild foraminal encroachment by
facet osteophytes without visible neural compression.

L2-3: Minimal bulging of the disc. Mild facet and ligamentous
hypertrophy. No compressive stenosis. Anterior disc herniation which
could be a cause of pain.

L3-4: Disc degeneration with disc protrusion more prominent in the
right posterior lateral direction. Foraminal extension of disc
material on the right likely to compress the right L3 nerve root. No
central canal stenosis.

L4-5: Previous left hemilaminectomy. Chronic disc degeneration.
Endplate osteophytes and bulging disc material. Slight indentation
of the thecal sac. Mild narrowing and both lateral recesses. No
definite ongoing neural compression.

L5-S1:  Some transitional features.  Normal interspace.
IMPRESSION: Right foraminal disc herniation at L3-4 likely to compress the right
L3 nerve root.

Chronic postoperative changes at L4-5 with left hemilaminectomy.
Endplate osteophytes and bulging of the disc. Mild narrowing of the
lateral recesses but without definite neural compression.

Anterior disc herniation at L2-3 which could be associated with pain
syndrome.

## 2016-06-21 DIAGNOSIS — R69 Illness, unspecified: Secondary | ICD-10-CM | POA: Diagnosis not present

## 2016-08-01 DIAGNOSIS — E230 Hypopituitarism: Secondary | ICD-10-CM | POA: Diagnosis not present

## 2016-08-01 DIAGNOSIS — Z13820 Encounter for screening for osteoporosis: Secondary | ICD-10-CM | POA: Diagnosis not present

## 2016-08-01 DIAGNOSIS — D751 Secondary polycythemia: Secondary | ICD-10-CM | POA: Diagnosis not present

## 2016-08-01 DIAGNOSIS — E119 Type 2 diabetes mellitus without complications: Secondary | ICD-10-CM | POA: Diagnosis not present

## 2016-08-01 DIAGNOSIS — H04129 Dry eye syndrome of unspecified lacrimal gland: Secondary | ICD-10-CM | POA: Diagnosis not present

## 2016-08-01 DIAGNOSIS — K7581 Nonalcoholic steatohepatitis (NASH): Secondary | ICD-10-CM | POA: Diagnosis not present

## 2016-08-01 DIAGNOSIS — Z125 Encounter for screening for malignant neoplasm of prostate: Secondary | ICD-10-CM | POA: Diagnosis not present

## 2016-08-01 DIAGNOSIS — Z5181 Encounter for therapeutic drug level monitoring: Secondary | ICD-10-CM | POA: Diagnosis not present

## 2016-08-01 DIAGNOSIS — R682 Dry mouth, unspecified: Secondary | ICD-10-CM | POA: Diagnosis not present

## 2016-08-01 DIAGNOSIS — R209 Unspecified disturbances of skin sensation: Secondary | ICD-10-CM | POA: Diagnosis not present

## 2016-08-10 DIAGNOSIS — E119 Type 2 diabetes mellitus without complications: Secondary | ICD-10-CM | POA: Diagnosis not present

## 2016-08-17 ENCOUNTER — Encounter: Payer: Self-pay | Admitting: Podiatry

## 2016-08-17 ENCOUNTER — Ambulatory Visit (INDEPENDENT_AMBULATORY_CARE_PROVIDER_SITE_OTHER): Payer: Medicare HMO | Admitting: Podiatry

## 2016-08-17 ENCOUNTER — Telehealth: Payer: Self-pay | Admitting: Podiatry

## 2016-08-17 DIAGNOSIS — R61 Generalized hyperhidrosis: Secondary | ICD-10-CM

## 2016-08-17 DIAGNOSIS — L600 Ingrowing nail: Secondary | ICD-10-CM | POA: Diagnosis not present

## 2016-08-17 DIAGNOSIS — L74519 Primary focal hyperhidrosis, unspecified: Secondary | ICD-10-CM | POA: Diagnosis not present

## 2016-08-17 NOTE — Telephone Encounter (Signed)
No visit performed.  Erred encounter

## 2016-08-17 NOTE — Progress Notes (Signed)
   Subjective:    Patient ID: Stephen Herrera, male    DOB: [birth year], 61 y.o.   MRN: [id number]  HPI this patient presents the office with chief complaint of occasional sweating to both of his feet.  He says he has been evaluated for a systemic sweating  problem by multiple doctors in [place] and in [place]. He says he has had multiple tests and exams and has no conclusive reason for this sweating.  He also says that he has lost significant amount of weight for no reason.  Patient states that he has been diagnosed as diabetic for 10-15 years for which she takes metformin his medical doctor suggested he present to my office for an foot exam to determine if there is any pathology in his feet.  He does relate he has nail surgery performed on the inside border of his right big toe years ago and a piece of nail regrows from the site of the surgery every 6 months.  He presents the office today for a foot exam recommended by his medical doctor and discussion of this nail growth right big toe    Review of Systems  Constitutional: Positive for activity change, appetite change, chills, fatigue, fever and unexpected weight change.  HENT: Positive for hearing loss.   Eyes: Positive for pain and itching.  Respiratory: Positive for chest tightness.   Genitourinary: Positive for difficulty urinating.  Skin:       Nail change  Neurological: Positive for weakness and numbness.       Objective:   Physical Exam GENERAL APPEARANCE: Alert, conversant. Appropriately groomed. No acute distress.  VASCULAR: Pedal pulses are  palpable at  [facility] and PT bilateral.  Capillary refill time is immediate to all digits,  Normal temperature gradient.  Digital hair growth is present bilateral  NEUROLOGIC: sensation is normal to 5.07 monofilament at 5/5 sites bilateral.  Light touch is intact bilateral, Muscle strength normal.  MUSCULOSKELETAL: acceptable muscle strength, tone and stability bilateral.  Intrinsic muscluature intact  bilateral.  Rectus appearance of foot and digits noted bilateral.  NAILS  nail spicule noted in the medial aspect of the right hallux. DERMATOLOGIC: skin color, texture, and turgor are within normal limits.  No preulcerative lesions or ulcers  are seen, no interdigital maceration noted.  No open lesions present.   No drainage noted. No evidence of any hyperhidrosis in his feet at this visit.         Assessment & Plan:  Hyperhidrosis  Ingrown toenail right hallux.  IE  Discussed this condition with this patient.  There is no evidence of any hyperhidrosis at this visit.  There is a nail spicule that persists in the medial aspect of the right hallux.  This nail spicule is barely seen, and we discussed allowing it to grow and then to return to the office at which time a phenolization  of the nail  could be performed.   RTC prn   Gardiner Barefoot DPM

## 2016-08-29 DIAGNOSIS — Z0100 Encounter for examination of eyes and vision without abnormal findings: Secondary | ICD-10-CM | POA: Diagnosis not present

## 2016-08-29 DIAGNOSIS — H04123 Dry eye syndrome of bilateral lacrimal glands: Secondary | ICD-10-CM | POA: Diagnosis not present

## 2016-09-02 DIAGNOSIS — M4302 Spondylolysis, cervical region: Secondary | ICD-10-CM | POA: Diagnosis not present

## 2016-09-02 DIAGNOSIS — M47816 Spondylosis without myelopathy or radiculopathy, lumbar region: Secondary | ICD-10-CM | POA: Diagnosis not present

## 2016-09-14 DIAGNOSIS — R69 Illness, unspecified: Secondary | ICD-10-CM | POA: Diagnosis not present

## 2016-10-19 DIAGNOSIS — R6889 Other general symptoms and signs: Secondary | ICD-10-CM | POA: Diagnosis not present

## 2016-11-07 DIAGNOSIS — Z5181 Encounter for therapeutic drug level monitoring: Secondary | ICD-10-CM | POA: Diagnosis not present

## 2016-11-07 DIAGNOSIS — D751 Secondary polycythemia: Secondary | ICD-10-CM | POA: Diagnosis not present

## 2016-11-07 DIAGNOSIS — K7581 Nonalcoholic steatohepatitis (NASH): Secondary | ICD-10-CM | POA: Diagnosis not present

## 2016-11-07 DIAGNOSIS — Z125 Encounter for screening for malignant neoplasm of prostate: Secondary | ICD-10-CM | POA: Diagnosis not present

## 2016-11-07 DIAGNOSIS — E230 Hypopituitarism: Secondary | ICD-10-CM | POA: Diagnosis not present

## 2016-11-07 DIAGNOSIS — E119 Type 2 diabetes mellitus without complications: Secondary | ICD-10-CM | POA: Diagnosis not present

## 2016-11-07 DIAGNOSIS — R509 Fever, unspecified: Secondary | ICD-10-CM | POA: Diagnosis not present

## 2016-12-13 DIAGNOSIS — M47816 Spondylosis without myelopathy or radiculopathy, lumbar region: Secondary | ICD-10-CM | POA: Diagnosis not present

## 2017-01-16 DIAGNOSIS — Z5181 Encounter for therapeutic drug level monitoring: Secondary | ICD-10-CM | POA: Diagnosis not present

## 2017-01-16 DIAGNOSIS — E230 Hypopituitarism: Secondary | ICD-10-CM | POA: Diagnosis not present

## 2017-01-16 DIAGNOSIS — Z79899 Other long term (current) drug therapy: Secondary | ICD-10-CM | POA: Diagnosis not present

## 2017-01-16 DIAGNOSIS — E119 Type 2 diabetes mellitus without complications: Secondary | ICD-10-CM | POA: Diagnosis not present

## 2017-01-16 DIAGNOSIS — E782 Mixed hyperlipidemia: Secondary | ICD-10-CM | POA: Diagnosis not present

## 2017-01-18 DIAGNOSIS — E119 Type 2 diabetes mellitus without complications: Secondary | ICD-10-CM | POA: Diagnosis not present

## 2017-01-18 DIAGNOSIS — Z5181 Encounter for therapeutic drug level monitoring: Secondary | ICD-10-CM | POA: Diagnosis not present

## 2017-01-18 DIAGNOSIS — K7581 Nonalcoholic steatohepatitis (NASH): Secondary | ICD-10-CM | POA: Diagnosis not present

## 2017-01-18 DIAGNOSIS — E230 Hypopituitarism: Secondary | ICD-10-CM | POA: Diagnosis not present

## 2017-03-17 DIAGNOSIS — R937 Abnormal findings on diagnostic imaging of other parts of musculoskeletal system: Secondary | ICD-10-CM | POA: Diagnosis not present

## 2017-03-17 DIAGNOSIS — M47816 Spondylosis without myelopathy or radiculopathy, lumbar region: Secondary | ICD-10-CM | POA: Diagnosis not present

## 2017-03-17 DIAGNOSIS — M25851 Other specified joint disorders, right hip: Secondary | ICD-10-CM | POA: Diagnosis not present

## 2017-03-17 DIAGNOSIS — M1611 Unilateral primary osteoarthritis, right hip: Secondary | ICD-10-CM | POA: Diagnosis not present

## 2017-03-27 ENCOUNTER — Other Ambulatory Visit: Payer: Self-pay

## 2017-03-27 ENCOUNTER — Other Ambulatory Visit: Payer: Self-pay | Admitting: Neurosurgery

## 2017-03-27 DIAGNOSIS — M25851 Other specified joint disorders, right hip: Secondary | ICD-10-CM

## 2017-03-28 ENCOUNTER — Ambulatory Visit
Admission: RE | Admit: 2017-03-28 | Discharge: 2017-03-28 | Disposition: A | Discharge status: Home / Self Care | Payer: Medicare HMO | Source: Ambulatory Visit | Attending: Neurosurgery | Admitting: Neurosurgery

## 2017-03-28 DIAGNOSIS — M25551 Pain in right hip: Secondary | ICD-10-CM | POA: Diagnosis not present

## 2017-03-28 DIAGNOSIS — M25851 Other specified joint disorders, right hip: Secondary | ICD-10-CM

## 2017-04-13 ENCOUNTER — Ambulatory Visit (INDEPENDENT_AMBULATORY_CARE_PROVIDER_SITE_OTHER): Payer: Medicare HMO | Admitting: Orthopedic Surgery

## 2017-04-13 ENCOUNTER — Encounter (INDEPENDENT_AMBULATORY_CARE_PROVIDER_SITE_OTHER): Payer: Self-pay | Admitting: Orthopedic Surgery

## 2017-04-13 DIAGNOSIS — G8929 Other chronic pain: Secondary | ICD-10-CM | POA: Diagnosis not present

## 2017-04-13 DIAGNOSIS — M5441 Lumbago with sciatica, right side: Secondary | ICD-10-CM | POA: Diagnosis not present
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
## 2017-04-14 NOTE — Progress Notes (Signed)
Faxed note to Dr Rex Kras office [phone]

## 2017-04-18 DIAGNOSIS — N5082 Scrotal pain: Secondary | ICD-10-CM | POA: Diagnosis not present

## 2017-04-19 ENCOUNTER — Emergency Department (HOSPITAL_COMMUNITY)
Admission: EM | Admit: 2017-04-19 | Discharge: 2017-04-20 | Disposition: A | Discharge status: Other Acute Inpatient Hospital | Payer: Medicare HMO | Attending: Emergency Medicine | Admitting: Emergency Medicine

## 2017-04-19 ENCOUNTER — Encounter (HOSPITAL_COMMUNITY): Payer: Self-pay | Admitting: Student

## 2017-04-19 DIAGNOSIS — R1030 Lower abdominal pain, unspecified: Secondary | ICD-10-CM | POA: Diagnosis not present

## 2017-04-19 DIAGNOSIS — N401 Enlarged prostate with lower urinary tract symptoms: Secondary | ICD-10-CM | POA: Diagnosis not present

## 2017-04-19 DIAGNOSIS — E119 Type 2 diabetes mellitus without complications: Secondary | ICD-10-CM | POA: Diagnosis not present

## 2017-04-19 DIAGNOSIS — J449 Chronic obstructive pulmonary disease, unspecified: Secondary | ICD-10-CM | POA: Insufficient documentation

## 2017-04-19 DIAGNOSIS — F332 Major depressive disorder, recurrent severe without psychotic features: Secondary | ICD-10-CM | POA: Diagnosis present

## 2017-04-19 DIAGNOSIS — E039 Hypothyroidism, unspecified: Secondary | ICD-10-CM | POA: Insufficient documentation

## 2017-04-19 DIAGNOSIS — I2510 Atherosclerotic heart disease of native coronary artery without angina pectoris: Secondary | ICD-10-CM | POA: Insufficient documentation

## 2017-04-19 DIAGNOSIS — R351 Nocturia: Secondary | ICD-10-CM | POA: Diagnosis not present

## 2017-04-19 DIAGNOSIS — R45851 Suicidal ideations: Secondary | ICD-10-CM

## 2017-04-19 DIAGNOSIS — R0602 Shortness of breath: Secondary | ICD-10-CM | POA: Diagnosis not present

## 2017-04-19 DIAGNOSIS — Z87891 Personal history of nicotine dependence: Secondary | ICD-10-CM | POA: Insufficient documentation

## 2017-04-19 DIAGNOSIS — Z7982 Long term (current) use of aspirin: Secondary | ICD-10-CM | POA: Insufficient documentation

## 2017-04-19 DIAGNOSIS — R69 Illness, unspecified: Secondary | ICD-10-CM | POA: Diagnosis not present

## 2017-04-19 DIAGNOSIS — F410 Panic disorder [episodic paroxysmal anxiety] without agoraphobia: Secondary | ICD-10-CM | POA: Diagnosis present

## 2017-04-19 DIAGNOSIS — Z736 Limitation of activities due to disability: Secondary | ICD-10-CM | POA: Diagnosis not present

## 2017-04-19 LAB — COMPREHENSIVE METABOLIC PANEL
ALBUMIN: 4.4 g/dL (ref 3.5–5.0)
ALT: 38 U/L (ref 17–63)
AST: 25 U/L (ref 15–41)
Alkaline Phosphatase: 66 U/L (ref 38–126)
Anion gap: 12 (ref 5–15)
BUN: 22 mg/dL — AB (ref 6–20)
CALCIUM: 9.5 mg/dL (ref 8.9–10.3)
CO2: 23 mmol/L (ref 22–32)
CREATININE: 0.83 mg/dL (ref 0.61–1.24)
Chloride: 105 mmol/L (ref 101–111)
GFR calc Af Amer: >60 mL/min (ref >60)
GFR calc non Af Amer: >60 mL/min (ref >60)
GLUCOSE: 113 mg/dL — AB (ref 65–99)
Potassium: 3.7 mmol/L (ref 3.5–5.1)
SODIUM: 140 mmol/L (ref 135–145)
Total Bilirubin: 0.6 mg/dL (ref 0.3–1.2)
Total Protein: 8.5 g/dL — ABNORMAL HIGH (ref 6.5–8.1)

## 2017-04-19 LAB — URINALYSIS, ROUTINE W REFLEX MICROSCOPIC
BACTERIA UA: NONE SEEN
Bilirubin Urine: NEGATIVE
Hgb urine dipstick: NEGATIVE
Ketones, ur: 5 mg/dL — AB
Leukocytes, UA: NEGATIVE
Nitrite: NEGATIVE
Protein, ur: NEGATIVE mg/dL
SQUAMOUS EPITHELIAL / LPF: NONE SEEN
Specific Gravity, Urine: 1.025 (ref 1.005–1.030)
WBC, UA: NONE SEEN WBC/hpf (ref 0–5)
pH: 7 (ref 5.0–8.0)

## 2017-04-19 LAB — RAPID URINE DRUG SCREEN, HOSP PERFORMED
AMPHETAMINES: NOT DETECTED
BENZODIAZEPINES: NOT DETECTED
Barbiturates: NOT DETECTED
COCAINE: NOT DETECTED
Opiates: NOT DETECTED
Tetrahydrocannabinol: POSITIVE — AB

## 2017-04-19 LAB — CBC WITH DIFFERENTIAL/PLATELET
Basophils Absolute: 0 10*3/uL (ref 0.0–0.1)
Basophils Relative: 0 %
EOS PCT: 1 %
Eosinophils Absolute: 0.1 10*3/uL (ref 0.0–0.7)
HEMATOCRIT: 46.6 % (ref 39.0–52.0)
Hemoglobin: 16 g/dL (ref 13.0–17.0)
LYMPHS ABS: 2 10*3/uL (ref 0.7–4.0)
LYMPHS PCT: 20 %
MCH: 30.9 pg (ref 26.0–34.0)
MCHC: 34.3 g/dL (ref 30.0–36.0)
MCV: 90 fL (ref 78.0–100.0)
MONO ABS: 0.7 10*3/uL (ref 0.1–1.0)
Monocytes Relative: 7 %
NEUTROS ABS: 7.1 10*3/uL (ref 1.7–7.7)
Neutrophils Relative %: 72 %
PLATELETS: 276 10*3/uL (ref 150–400)
RBC: 5.18 MIL/uL (ref 4.22–5.81)
RDW: 13.1 % (ref 11.5–15.5)
WBC: 9.8 10*3/uL (ref 4.0–10.5)

## 2017-04-19 LAB — ETHANOL: Alcohol, Ethyl (B): <10 mg/dL (ref <10)

## 2017-04-19 LAB — TROPONIN I: Troponin I: <0.03 ng/mL (ref <0.03)

## 2017-04-19 LAB — D-DIMER, QUANTITATIVE (NOT AT ARMC): D DIMER QUANT: 0.34 ug{FEU}/mL (ref 0.00–0.50)

## 2017-04-19 MED ORDER — OXYCODONE HCL 5 MG PO TABS: 5.0000 mg | ORAL_TABLET | ORAL | Status: DC | PRN

## 2017-04-19 MED ORDER — ONDANSETRON HCL 4 MG PO TABS: 4.0000 mg | ORAL_TABLET | Freq: Three times a day (TID) | ORAL | Status: DC | PRN

## 2017-04-19 MED ORDER — ASPIRIN EC 81 MG PO TBEC
81.0000 mg | DELAYED_RELEASE_TABLET | Freq: Every day | ORAL | Status: DC
  Administered 2017-04-20: 81 mg via ORAL
  Filled 2017-04-19: qty 1

## 2017-04-19 MED ORDER — IBUPROFEN 200 MG PO TABS: 600.0000 mg | ORAL_TABLET | Freq: Three times a day (TID) | ORAL | Status: DC | PRN

## 2017-04-19 MED ORDER — NAPROXEN SODIUM 275 MG PO TABS
440.0000 mg | ORAL_TABLET | Freq: Every day | ORAL | Status: DC | PRN
  Administered 2017-04-20: 412.5 mg via ORAL
  Filled 2017-04-19 (×3): qty 2

## 2017-04-19 MED ORDER — ALUM & MAG HYDROXIDE-SIMETH 200-200-20 MG/5ML PO SUSP: 30.0000 mL | Freq: Four times a day (QID) | ORAL | Status: DC | PRN

## 2017-04-19 MED ORDER — OXYCODONE-ACETAMINOPHEN 5-325 MG PO TABS: 1.0000 | ORAL_TABLET | ORAL | Status: DC | PRN

## 2017-04-19 MED ORDER — ZOLPIDEM TARTRATE 5 MG PO TABS: 5.0000 mg | ORAL_TABLET | Freq: Every evening | ORAL | Status: DC | PRN

## 2017-04-19 MED ORDER — OXYCODONE-ACETAMINOPHEN 10-325 MG PO TABS: 1.0000 | ORAL_TABLET | ORAL | Status: DC | PRN

## 2017-04-19 MED ORDER — ACETAMINOPHEN 325 MG PO TABS: 650.0000 mg | ORAL_TABLET | ORAL | Status: DC | PRN

## 2017-04-19 MED ORDER — OXYCODONE-ACETAMINOPHEN 5-325 MG PO TABS
1.0000 | ORAL_TABLET | Freq: Once | ORAL | Status: AC
  Administered 2017-04-19: 1 via ORAL
  Filled 2017-04-19: qty 1

## 2017-04-19 MED ORDER — LORAZEPAM 1 MG PO TABS
1.0000 mg | ORAL_TABLET | Freq: Once | ORAL | Status: AC
  Administered 2017-04-19: 1 mg via ORAL
  Filled 2017-04-19: qty 1

## 2017-04-19 MED ORDER — NAPROXEN 500 MG PO TABS
500.0000 mg | ORAL_TABLET | Freq: Once | ORAL | Status: AC
  Administered 2017-04-19: 500 mg via ORAL
  Filled 2017-04-19: qty 1

## 2017-04-19 MED ORDER — FENTANYL CITRATE (PF) 100 MCG/2ML IJ SOLN
100.0000 ug | Freq: Once | INTRAMUSCULAR | Status: AC
  Administered 2017-04-19: 100 ug via INTRAVENOUS
  Filled 2017-04-19: qty 2
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
## 2017-04-19 NOTE — ED Notes (Signed)
Patient asked for the hospitalist. I attempted to adjust his BP cuff in order to update his vitals and he told me "Don't touch me!". I told him "Ok." left the room, and informed Raquel Sarna, Therapist, sports
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
## 2017-04-19 NOTE — ED Notes (Signed)
Tori, RN from [facility] called to say they will continue to hold a bed for the patient in the morning. ([phone] Call [facility] in the am after 0730 to confirm the time pt can be transferred.

## 2017-04-19 NOTE — ED Notes (Signed)
Sitter at bedside.
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
## 2017-04-19 NOTE — ED Notes (Signed)
Dr. Billy Fischer notified that patient would like to speak with her. She will review his chart and see him as soon as she can.

## 2017-04-19 NOTE — BH Assessment (Signed)
Pt presented to the lobby requesting to be seen, but immediately collapsed into chair so this writer was called by front desk/reception. Patient in acute distress with rapid breathing, stuttering speech, sweating, C/O shortness of breath anxiety consistent with panic attack presentation. Patient intermittently writhing in the chair stating '' I'm gonna pass out '' Unable to obtain much history due to patients current condition but patient reported ''  I had to lock my guns up in my safe at my house . I need help '' patient unable to complete walk in form, and in acute distress so EMS was called. Patient also reported to writer '' lllast time I was like this I had a stroke Designer, jewellery phoned EMS , presented to the scene. V/S obtained and noted patient very tachycardic, elevated B/P . HR 130'S B/P 170/110 . Report given to EMS and copy of V/S provided. Patient escorted from lobby on stretcher with EMS. NP notified that patient did not complete walk in form and EMS called for emergent treatment. Report also given to TTS . Pt will require TTS assessment once medically cleared.

## 2017-04-19 NOTE — ED Notes (Signed)
Bed: FG90 Expected date:  Expected time:  Means of arrival:  Comments: EMS/panic attack

## 2017-04-19 NOTE — BH Assessment (Signed)
[place] Assessment Progress Note  Pt was tentatively accepted to [facility]. Pt was not happy about this with his main concern being that he would not be able to continue his pain medications. He reports that he has been prescribed oxycodone 3-4 times/day PRN by his neurologist, Glenna Fellows, at [facility] in [place]. He indicates that if he doesn't get his pain medication he will be "climbing up the walls" , which would be counter productive to his treatment. He reports that he's not depressed, just "frustrated and in pain". He shares that, at the time of his panic attack and near suicide attempt, he felt that going to [facility] for help was better than killing himself. Now, that he's had time to reflect and calm down, he doesn't feel that it would be the best choice, primarily b/c of the likeliness of not receiving his pain medication. Pt aware of the IVC process, but agrees to stay overnight and allow the psychiatrist to assess him in the morning.   Kenna Gilbert. Lovena Le, [place], [place], LPCA Counselor

## 2017-04-19 NOTE — ED Notes (Signed)
ED Provider at bedside. 

## 2017-04-20 ENCOUNTER — Inpatient Hospital Stay (HOSPITAL_COMMUNITY)
Admission: AD | Admit: 2017-04-20 | Discharge: 2017-04-27 | DRG: 885 | Disposition: A | Discharge status: Home / Self Care | Payer: Medicare HMO | Source: Intra-hospital | Attending: Psychiatry | Admitting: Psychiatry

## 2017-04-20 ENCOUNTER — Other Ambulatory Visit: Payer: Self-pay

## 2017-04-20 ENCOUNTER — Encounter (HOSPITAL_COMMUNITY): Payer: Self-pay | Admitting: Behavioral Health

## 2017-04-20 DIAGNOSIS — K219 Gastro-esophageal reflux disease without esophagitis: Secondary | ICD-10-CM | POA: Diagnosis present

## 2017-04-20 DIAGNOSIS — E785 Hyperlipidemia, unspecified: Secondary | ICD-10-CM | POA: Diagnosis present

## 2017-04-20 DIAGNOSIS — K589 Irritable bowel syndrome without diarrhea: Secondary | ICD-10-CM | POA: Diagnosis present

## 2017-04-20 DIAGNOSIS — R55 Syncope and collapse: Secondary | ICD-10-CM | POA: Diagnosis not present

## 2017-04-20 DIAGNOSIS — J449 Chronic obstructive pulmonary disease, unspecified: Secondary | ICD-10-CM | POA: Diagnosis present

## 2017-04-20 DIAGNOSIS — I2510 Atherosclerotic heart disease of native coronary artery without angina pectoris: Secondary | ICD-10-CM | POA: Diagnosis present

## 2017-04-20 DIAGNOSIS — R45851 Suicidal ideations: Secondary | ICD-10-CM

## 2017-04-20 DIAGNOSIS — Y900 Blood alcohol level of less than 20 mg/100 ml: Secondary | ICD-10-CM | POA: Diagnosis not present

## 2017-04-20 DIAGNOSIS — Z736 Limitation of activities due to disability: Secondary | ICD-10-CM | POA: Diagnosis not present

## 2017-04-20 DIAGNOSIS — Z87891 Personal history of nicotine dependence: Secondary | ICD-10-CM

## 2017-04-20 DIAGNOSIS — Z7982 Long term (current) use of aspirin: Secondary | ICD-10-CM | POA: Diagnosis not present

## 2017-04-20 DIAGNOSIS — F332 Major depressive disorder, recurrent severe without psychotic features: Principal | ICD-10-CM | POA: Diagnosis present

## 2017-04-20 DIAGNOSIS — Z7984 Long term (current) use of oral hypoglycemic drugs: Secondary | ICD-10-CM | POA: Diagnosis not present

## 2017-04-20 DIAGNOSIS — E119 Type 2 diabetes mellitus without complications: Secondary | ICD-10-CM | POA: Diagnosis not present

## 2017-04-20 DIAGNOSIS — F419 Anxiety disorder, unspecified: Secondary | ICD-10-CM | POA: Diagnosis not present

## 2017-04-20 DIAGNOSIS — G4700 Insomnia, unspecified: Secondary | ICD-10-CM | POA: Diagnosis present

## 2017-04-20 DIAGNOSIS — G8929 Other chronic pain: Secondary | ICD-10-CM | POA: Diagnosis not present

## 2017-04-20 DIAGNOSIS — Z888 Allergy status to other drugs, medicaments and biological substances status: Secondary | ICD-10-CM

## 2017-04-20 DIAGNOSIS — F909 Attention-deficit hyperactivity disorder, unspecified type: Secondary | ICD-10-CM | POA: Diagnosis present

## 2017-04-20 DIAGNOSIS — Z79899 Other long term (current) drug therapy: Secondary | ICD-10-CM | POA: Diagnosis not present

## 2017-04-20 DIAGNOSIS — R69 Illness, unspecified: Secondary | ICD-10-CM | POA: Diagnosis not present

## 2017-04-20 DIAGNOSIS — F1290 Cannabis use, unspecified, uncomplicated: Secondary | ICD-10-CM | POA: Diagnosis not present

## 2017-04-20 DIAGNOSIS — F410 Panic disorder [episodic paroxysmal anxiety] without agoraphobia: Secondary | ICD-10-CM | POA: Diagnosis not present

## 2017-04-20 DIAGNOSIS — R569 Unspecified convulsions: Secondary | ICD-10-CM

## 2017-04-20 DIAGNOSIS — Z634 Disappearance and death of family member: Secondary | ICD-10-CM | POA: Diagnosis not present

## 2017-04-20 DIAGNOSIS — R410 Disorientation, unspecified: Secondary | ICD-10-CM | POA: Diagnosis not present

## 2017-04-20 DIAGNOSIS — E039 Hypothyroidism, unspecified: Secondary | ICD-10-CM | POA: Diagnosis present

## 2017-04-20 DIAGNOSIS — M545 Low back pain: Secondary | ICD-10-CM | POA: Diagnosis present

## 2017-04-20 DIAGNOSIS — S0990XA Unspecified injury of head, initial encounter: Secondary | ICD-10-CM | POA: Diagnosis not present

## 2017-04-20 DIAGNOSIS — R531 Weakness: Secondary | ICD-10-CM | POA: Diagnosis not present

## 2017-04-20 LAB — GLUCOSE, CAPILLARY: Glucose-Capillary: 133 mg/dL — ABNORMAL HIGH (ref 65–99)

## 2017-04-20 LAB — CBG MONITORING, ED: GLUCOSE-CAPILLARY: 151 mg/dL — AB (ref 65–99)

## 2017-04-20 MED ORDER — METFORMIN HCL ER 500 MG PO TB24
1000.0000 mg | ORAL_TABLET | Freq: Every day | ORAL | Status: DC
  Administered 2017-04-20: 1000 mg via ORAL
  Filled 2017-04-20 (×5): qty 2

## 2017-04-20 MED ORDER — HYDROXYZINE HCL 50 MG PO TABS
50.0000 mg | ORAL_TABLET | Freq: Three times a day (TID) | ORAL | Status: DC | PRN
  Administered 2017-04-21 – 2017-04-22 (×2): 50 mg via ORAL
  Filled 2017-04-20 (×5): qty 1

## 2017-04-20 MED ORDER — IBUPROFEN 800 MG PO TABS: 800.0000 mg | ORAL_TABLET | Freq: Three times a day (TID) | ORAL | Status: DC | PRN

## 2017-04-20 MED ORDER — IBUPROFEN 800 MG PO TABS
800.0000 mg | ORAL_TABLET | Freq: Three times a day (TID) | ORAL | Status: DC | PRN
  Administered 2017-04-20 – 2017-04-21 (×4): 800 mg via ORAL
  Filled 2017-04-20 (×3): qty 1

## 2017-04-20 MED ORDER — CANAGLIFLOZIN 100 MG PO TABS
100.0000 mg | ORAL_TABLET | Freq: Every day | ORAL | Status: DC
  Administered 2017-04-21 – 2017-04-22 (×2): 100 mg via ORAL
  Filled 2017-04-20 (×4): qty 1

## 2017-04-20 MED ORDER — ESCITALOPRAM OXALATE 5 MG PO TABS
5.0000 mg | ORAL_TABLET | Freq: Every day | ORAL | Status: DC
  Filled 2017-04-20 (×2): qty 1

## 2017-04-20 MED ORDER — ONDANSETRON HCL 4 MG PO TABS: 4.0000 mg | ORAL_TABLET | Freq: Three times a day (TID) | ORAL | Status: DC | PRN

## 2017-04-20 MED ORDER — ESCITALOPRAM OXALATE 10 MG PO TABS
5.0000 mg | ORAL_TABLET | Freq: Every day | ORAL | Status: DC
  Filled 2017-04-20: qty 1

## 2017-04-20 MED ORDER — ALUM & MAG HYDROXIDE-SIMETH 200-200-20 MG/5ML PO SUSP: 30.0000 mL | Freq: Four times a day (QID) | ORAL | Status: DC | PRN

## 2017-04-20 MED ORDER — ACETAMINOPHEN 325 MG PO TABS
650.0000 mg | ORAL_TABLET | ORAL | Status: DC | PRN
  Administered 2017-04-25 (×3): 650 mg via ORAL
  Filled 2017-04-20 (×3): qty 2

## 2017-04-20 MED ORDER — MAGNESIUM HYDROXIDE 400 MG/5ML PO SUSP: 30.0000 mL | Freq: Every day | ORAL | Status: DC | PRN

## 2017-04-20 MED ORDER — EMPAGLIFLOZIN-METFORMIN HCL ER 25-1000 MG PO TB24: 1.0000 | ORAL_TABLET | Freq: Every day | ORAL | Status: DC

## 2017-04-20 MED ORDER — METFORMIN HCL ER 500 MG PO TB24
1000.0000 mg | ORAL_TABLET | Freq: Every day | ORAL | Status: DC
  Administered 2017-04-20: 1000 mg via ORAL
  Filled 2017-04-20: qty 2

## 2017-04-20 MED ORDER — TRAZODONE HCL 50 MG PO TABS
50.0000 mg | ORAL_TABLET | Freq: Every evening | ORAL | Status: DC | PRN
  Administered 2017-04-20 – 2017-04-24 (×3): 50 mg via ORAL
  Filled 2017-04-20 (×17): qty 1

## 2017-04-20 MED ORDER — METFORMIN HCL 500 MG PO TABS: 1000.0000 mg | ORAL_TABLET | Freq: Every day | ORAL | Status: DC

## 2017-04-20 MED ORDER — ASPIRIN EC 81 MG PO TBEC
81.0000 mg | DELAYED_RELEASE_TABLET | Freq: Every day | ORAL | Status: DC
  Administered 2017-04-21 – 2017-04-27 (×6): 81 mg via ORAL
  Filled 2017-04-20 (×10): qty 1

## 2017-04-20 MED ORDER — CANAGLIFLOZIN 100 MG PO TABS
100.0000 mg | ORAL_TABLET | Freq: Every day | ORAL | Status: DC
  Administered 2017-04-20: 100 mg via ORAL
  Filled 2017-04-20: qty 1

## 2017-04-20 MED ORDER — METFORMIN HCL ER 500 MG PO TB24
1000.0000 mg | ORAL_TABLET | Freq: Every day | ORAL | Status: DC
  Administered 2017-04-21 – 2017-04-22 (×2): 1000 mg via ORAL
  Filled 2017-04-20 (×4): qty 2

## 2017-04-20 MED ORDER — HYDROXYZINE HCL 25 MG PO TABS: 50.0000 mg | ORAL_TABLET | Freq: Three times a day (TID) | ORAL | Status: DC | PRN

## 2017-04-20 MED ORDER — KETOROLAC TROMETHAMINE 30 MG/ML IJ SOLN
30.0000 mg | Freq: Once | INTRAMUSCULAR | Status: AC
  Administered 2017-04-20: 30 mg via INTRAVENOUS
  Filled 2017-04-20: qty 1

## 2017-04-20 NOTE — Tx Team (Addendum)
Initial Treatment Plan 04/20/2017 2:14 PM Stephen Herrera [id number]    PATIENT STRESSORS: Health problems Substance abuse   PATIENT STRENGTHS: Ability for insight Capable of independent living   PATIENT IDENTIFIED PROBLEMS: "Find a tool to work on things that I can not control"  "Chronic pain/Medically fix pain"                   DISCHARGE CRITERIA:  Ability to meet basic life and health needs Adequate post-discharge living arrangements  PRELIMINARY DISCHARGE PLAN: Attend aftercare/continuing care group Attend PHP/IOP  PATIENT/FAMILY INVOLVEMENT: This treatment plan has been presented to and reviewed with the patient, Stephen Herrera, and/or family member.  The patient and family have been given the opportunity to ask questions and make suggestions.  Marissa Calamity, RN 04/20/2017, 2:14 PM

## 2017-04-20 NOTE — ED Notes (Addendum)
Pt A&O x 3, no acute distress noted, presents with SI, took gun and held it to head, didn't pull trigger.  Pt reports he suffers from severe anxiety.  Denies HI or AVH.   Monitoring for safety, Q 15 min checks in effect.

## 2017-04-20 NOTE — Progress Notes (Signed)
D    Pt is depressed and irritable   He was requesting opiate pain killers for pain and said he was on them prior to coming into the hospital but his UDS was negative for opiates however he said his last dose was Tuesday   He is cooperative and his behavior and interactions are appropriate A    Verbal support given    Medications administered and effectiveness monitored    Q 15 min checks R    Pt is safe at present

## 2017-04-20 NOTE — Progress Notes (Signed)
Pt requested to review meds with Probation officer. Pt stated that he takes metformin 1000 mg XR at supper time if his blood sugar is greater than 115. Writer notified Herbert Pun, NP. Verbal order given by Herbert Pun NP., for this writer to order Metformin 1000 XR at supper for a blood sugar greater than 115. Pharmacy called to verify order with Probation officer. Writer verified order with pt and pharmacist.

## 2017-04-20 NOTE — ED Notes (Signed)
Educated pt on lexapro, he refused.

## 2017-04-20 NOTE — ED Notes (Signed)
Report given to [facility]. Bed available at 1300 ([phone]). Pt has been educated by Teaching laboratory technician.

## 2017-04-20 NOTE — ED Provider Notes (Signed)
Please see prior notes for history, physical and care.  Briefly this is a 62 year old male who is awaiting reevaluation with psychiatry in the morning, who requested to speak with me regarding continued pain concerns.  Patient reported he has had right sided groin pain has been present for the past 2-3 days, and in addition to multiple other concerns regarding chronic pain.  Regarding his right groin pain, patient had been evaluated yesterday by his PCP, urology, and Dr. Kathrynn Humble, without emergent etiology noted.  I again examined the patient, who does not have any testicular tenderness, has normal cremasteric reflex, normal testicular lie, and given no sign of pain or other abnormalities on exam, have low suspicion for testicular torsion and do not feel ultrasound is indicated.  He does have some tenderness in his groin, over his inguinal lymph nodes.  I do not detect a hernia on exam, in addition he has no findings to suggest incarcerated or strangulated hernia, including no symptoms to suggest obstruction.  He does not have flank pain, and given tenderness over the groin area, do not feel this is likely pain that is radiating from nephrolithiasis.  Discussed that based off of my history, physical exam, as well as those performed previously today, I do not feel he requires further emergent imaging, they however do recommend continued follow-up as an outpatient with his primary care physician, chronic pain specialist and urologist.  I feel he is medically clear for [facility].  Given toradol for pain.   Gareth Morgan, MD 04/20/17 [phone]
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
## 2017-04-20 NOTE — Progress Notes (Signed)
High fall risk resulted in lower back pain (chronic).

## 2017-04-20 NOTE — Progress Notes (Signed)
Terese collected pt's valuables (money/clothing).

## 2017-04-20 NOTE — BH Assessment (Signed)
[facility] Assessment Progress Note  Per Buford Dresser, DO, this pt requires psychiatric hospitalization at this time.  Letitia Libra, RN, [facility] has assigned pt to [facility] Rm [id number].  Pt has reluctantly signed Voluntary Admission and Consent for Treatment, as well as Consent to Release Information to his wife and to his son, and signed forms have been faxed to [facility].  Pt's nurse, Langley Gauss, has been notified, and agrees to send original paperwork along with pt via [place] [place], and to call report to [phone].  Jalene Mullet, [place] Coordinator [phone]

## 2017-04-20 NOTE — Progress Notes (Signed)
Adult Psychoeducational Group Note  Date:  04/20/2017 Time: 1600  Group Topic/Focus:  Aromatherapy  Participation Level:  Active  Participation Quality:  Appropriate  Affect:  Appropriate  Cognitive:  Appropriate  Insight: Appropriate  Engagement in Group:  Engaged  Modes of Intervention:  Education  Additional Comments:    Stephen Herrera L 04/20/2017

## 2017-04-20 NOTE — ED Notes (Signed)
Pt denies SI. He is seeking medical attention, "I have been passed around. I don't want meds." He gave his guns to a friend to lock up.

## 2017-04-20 NOTE — ED Notes (Signed)
Informed MD of pt's home meds, will implement order for ACHS/oral diabetic meds.

## 2017-04-20 NOTE — ED Notes (Addendum)
Pt refuses to eat breakfast until his CBG Synjardy (am) and metiformin (pm). Pharmacy is with patient to obtain home med list.

## 2017-04-20 NOTE — Progress Notes (Signed)
Pt attend wrap up group, His day was a 3. His goal to control things he cannot do anything about.

## 2017-04-20 NOTE — Progress Notes (Signed)
Pt admitted to [facility] vol from [place]. Pt expressed that he attempted suicide by holding his gun to his head but talked himself out of it. Per pt, the gun is now locked up at a neighbor's house for safety. Pt stated that he walked in seeking help yesterday, here at [facility], had a panic attack and was sent to [facility] by EMS. Pt reports ongoing panic attacks due to hx of chronic pain. Pt verbalized that he's had chronic back pain for several months and is unable to get it medically fixed. Pt expressed that he's been self medicating with marijuana oils and edibles. Pt denies tobacco and etoh use. Pt reported having a stroke 18 months ago causing aphasia but theres no documentation regarding pt having a stroke. Pt also, stated that he had a seizure yesterday but there's no record of pt having a seizure in his chart. When asked if pt take meds for seizures, pt stated no. Pt gait unsteady at times while ambulating due to chronic lower back pain. Pt declined an assistive device when offered. Pt denies using any assistive device at home.

## 2017-04-20 NOTE — ED Notes (Signed)
CBG 151. 

## 2017-04-20 NOTE — ED Notes (Signed)
Offered pain regimen. He refuses the regimen. He denies SI/HI/AVH.

## 2017-04-21 DIAGNOSIS — I2510 Atherosclerotic heart disease of native coronary artery without angina pectoris: Secondary | ICD-10-CM

## 2017-04-21 DIAGNOSIS — G8929 Other chronic pain: Secondary | ICD-10-CM

## 2017-04-21 DIAGNOSIS — J449 Chronic obstructive pulmonary disease, unspecified: Secondary | ICD-10-CM

## 2017-04-21 DIAGNOSIS — Z634 Disappearance and death of family member: Secondary | ICD-10-CM

## 2017-04-21 DIAGNOSIS — F332 Major depressive disorder, recurrent severe without psychotic features: Principal | ICD-10-CM

## 2017-04-21 DIAGNOSIS — F1290 Cannabis use, unspecified, uncomplicated: Secondary | ICD-10-CM

## 2017-04-21 DIAGNOSIS — Y900 Blood alcohol level of less than 20 mg/100 ml: Secondary | ICD-10-CM

## 2017-04-21 DIAGNOSIS — E119 Type 2 diabetes mellitus without complications: Secondary | ICD-10-CM

## 2017-04-21 DIAGNOSIS — F410 Panic disorder [episodic paroxysmal anxiety] without agoraphobia: Secondary | ICD-10-CM

## 2017-04-21 LAB — GLUCOSE, CAPILLARY
Glucose-Capillary: 159 mg/dL — ABNORMAL HIGH (ref 65–99)
Glucose-Capillary: 117 mg/dL — ABNORMAL HIGH (ref 65–99)

## 2017-04-21 LAB — TSH: TSH: 1.751 u[IU]/mL (ref 0.350–4.500)

## 2017-04-21 MED ORDER — LIDOCAINE 5 % EX PTCH
1.0000 | MEDICATED_PATCH | Freq: Every day | CUTANEOUS | Status: DC
  Administered 2017-04-22 – 2017-04-23 (×2): 1 via TRANSDERMAL
  Filled 2017-04-21 (×11): qty 1

## 2017-04-21 MED ORDER — LORAZEPAM 1 MG PO TABS
1.0000 mg | ORAL_TABLET | Freq: Four times a day (QID) | ORAL | Status: DC | PRN
  Administered 2017-04-21: 1 mg via ORAL
  Filled 2017-04-21: qty 1

## 2017-04-21 MED ORDER — VENLAFAXINE HCL ER 37.5 MG PO CP24
37.5000 mg | ORAL_CAPSULE | Freq: Once | ORAL | Status: AC
  Administered 2017-04-21: 37.5 mg via ORAL
  Filled 2017-04-21 (×2): qty 1

## 2017-04-21 MED ORDER — VENLAFAXINE HCL ER 75 MG PO CP24
75.0000 mg | ORAL_CAPSULE | Freq: Every day | ORAL | Status: DC
  Administered 2017-04-25 – 2017-04-27 (×3): 75 mg via ORAL
  Filled 2017-04-21 (×9): qty 1
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
## 2017-04-21 NOTE — BHH Suicide Risk Assessment (Signed)
[place] INPATIENT:  Family/Significant Other Suicide Prevention Education  Suicide Prevention Education:  Patient Refusal for Family/Significant Other Suicide Prevention Education: The patient Stephen Herrera has refused to provide written consent for family/significant other to be provided Family/Significant Other Suicide Prevention Education during admission and/or prior to discharge.  Physician notified.  Frutoso Chase Harless Molinari 04/21/2017, 12:41 PM

## 2017-04-21 NOTE — [facility] Group Notes (Signed)
Adult Psychoeducational Group Note  Date:  04/21/2017 Time:  9:57 PM  Group Topic/Focus:  Wrap-Up Group:   The focus of this group is to help patients review their daily goal of treatment and discuss progress on daily workbooks.  Participation Level:  Minimal  Participation Quality:  Appropriate and Attentive  Affect:  Depressed  Cognitive:  Alert and Appropriate  Insight: Appropriate and Good  Engagement in Group:  Engaged  Modes of Intervention:  Discussion and Education  Additional Comments:  Pt attended and participated in wrap up group this evening. Pt rated their day a 9/10 to begin with, but it dropped to a 0 at the end of the night. Pt wife cam to visit and relayed some bad news about her health. Pt goal was to meet with the Dr. and the Dr was able to make a diagnosis they they have been seeking for some time now.   Cristi Loron 04/21/2017, 9:57 PM

## 2017-04-21 NOTE — Tx Team (Signed)
Interdisciplinary Treatment and Diagnostic Plan Update  04/21/2017 Time of Session: 0915 Stephen Herrera MRN: [id number]  Principal Diagnosis: <principal problem not specified>  Secondary Diagnoses: Active Problems:   Major depressive disorder, recurrent severe without psychotic features ([place])   Current Medications:  Current Facility-Administered Medications  Medication Dose Route Frequency Provider Last Rate Last Dose  . acetaminophen (TYLENOL) tablet 650 mg  650 mg Oral Q4H PRN Patrecia Pour, NP      . alum & mag hydroxide-simeth (MAALOX/MYLANTA) 200-200-20 MG/5ML suspension 30 mL  30 mL Oral Q6H PRN Patrecia Pour, NP      . aspirin EC tablet 81 mg  81 mg Oral Daily Patrecia Pour, NP   81 mg at 04/21/17 0801  . canagliflozin (INVOKANA) tablet 100 mg  100 mg Oral QAC breakfast Patrecia Pour, NP   100 mg at 04/21/17 7106   And  . metFORMIN (GLUCOPHAGE-XR) 24 hr tablet 1,000 mg  1,000 mg Oral Q breakfast Patrecia Pour, NP   1,000 mg at 04/21/17 2694  . escitalopram (LEXAPRO) tablet 5 mg  5 mg Oral Daily Lord, Jamison Y, NP      . hydrOXYzine (ATARAX/VISTARIL) tablet 50 mg  50 mg Oral TID PRN Patrecia Pour, NP      . ibuprofen (ADVIL,MOTRIN) tablet 800 mg  800 mg Oral TID PRN Patrecia Pour, NP   800 mg at 04/21/17 0801  . magnesium hydroxide (MILK OF MAGNESIA) suspension 30 mL  30 mL Oral Daily PRN Patrecia Pour, NP      . metFORMIN (GLUCOPHAGE-XR) 24 hr tablet 1,000 mg  1,000 mg Oral Q supper Lindell Spar I, NP   1,000 mg at 04/20/17 1734  . ondansetron (ZOFRAN) tablet 4 mg  4 mg Oral Q8H PRN Patrecia Pour, NP      . traZODone (DESYREL) tablet 50 mg  50 mg Oral QHS,MR X 1 [name] A, NP   50 mg at 04/20/17 2222   PTA Medications: Medications Prior to Admission  Medication Sig Dispense Refill Last Dose  . aspirin 81 MG tablet Take 1 tablet (81 mg total) by mouth daily. 30 tablet 0 04/19/2017 at Unknown time  . Empagliflozin-metFORMIN HCl ER (SYNJARDY XR) 25-1000 MG  TB24 Take 1 tablet by mouth daily with breakfast. Hold if blood sugar is <115   Past Week at Unknown time  . metFORMIN (GLUCOPHAGE) 500 MG tablet Take 1,000 mg by mouth daily with supper.   Past Week at Unknown time  . naproxen sodium (ALEVE) 220 MG tablet Take 440 mg by mouth daily as needed (PAIN).   04/18/2017 at Unknown time    Patient Stressors: Health problems Substance abuse  Patient Strengths: Ability for insight Capable of independent living  Treatment Modalities: Medication Management, Group therapy, Case management,  1 to 1 session with clinician, Psychoeducation, Recreational therapy.   Physician Treatment Plan for Primary Diagnosis: <principal problem not specified> Long Term Goal(s):     Short Term Goals:    Medication Management: Evaluate patient's response, side effects, and tolerance of medication regimen.  Therapeutic Interventions: 1 to 1 sessions, Unit Group sessions and Medication administration.  Evaluation of Outcomes: Not Met  Physician Treatment Plan for Secondary Diagnosis: Active Problems:   Major depressive disorder, recurrent severe without psychotic features ([place])  Long Term Goal(s):     Short Term Goals:       Medication Management: Evaluate patient's response, side effects, and tolerance of medication regimen.  Therapeutic Interventions: 1 to 1 sessions, Unit Group sessions and Medication administration.  Evaluation of Outcomes: Not Met   RN Treatment Plan for Primary Diagnosis: <principal problem not specified> Long Term Goal(s): Knowledge of disease and therapeutic regimen to maintain health will improve  Short Term Goals: Ability to identify and develop effective coping behaviors will improve and Compliance with prescribed medications will improve  Medication Management: RN will administer medications as ordered by provider, will assess and evaluate patient's response and provide education to patient for prescribed medication. RN will  report any adverse and/or side effects to prescribing provider.  Therapeutic Interventions: 1 on 1 counseling sessions, Psychoeducation, Medication administration, Evaluate responses to treatment, Monitor vital signs and CBGs as ordered, Perform/monitor CIWA, COWS, AIMS and Fall Risk screenings as ordered, Perform wound care treatments as ordered.  Evaluation of Outcomes: Not Met   LCSW Treatment Plan for Primary Diagnosis: <principal problem not specified> Long Term Goal(s): Safe transition to appropriate next level of care at discharge, Engage patient in therapeutic group addressing interpersonal concerns.  Short Term Goals: Engage patient in aftercare planning with referrals and resources, Increase social support and Increase skills for wellness and recovery  Therapeutic Interventions: Assess for all discharge needs, 1 to 1 time with Social worker, Explore available resources and support systems, Assess for adequacy in community support network, Educate family and significant other(s) on suicide prevention, Complete Psychosocial Assessment, Interpersonal group therapy.  Evaluation of Outcomes: Not Met   Progress in Treatment: Attending groups: No. Participating in groups: No. Taking medication as prescribed: Yes. Toleration medication: Yes. Family/Significant other contact made: No, will contact:  when given permission Patient understands diagnosis: Yes. Discussing patient identified problems/goals with staff: Yes. Medical problems stabilized or resolved: Yes. Denies suicidal/homicidal ideation: Yes. Issues/concerns per patient self-inventory: Yes. Other: none  New problem(s) identified: No, Describe:  none  New Short Term/Long Term Goal(s):Pt goal: "learn how to cope with things I can't control."  Discharge Plan or Barriers:   Reason for Continuation of Hospitalization: Depression Suicidal ideation  Estimated Length of Stay: 3-5 days.  Attendees: Patient: Stephen Herrera  04/21/2017   Physician: Dr Sanjuana Letters, MD 04/21/2017   Nursing: Grayland Ormond, RN 04/21/2017   RN Care Manager: 04/21/2017   Social Worker: Lurline Idol, LCSW 04/21/2017   Recreational Therapist:  04/21/2017   Other:  04/21/2017   Other:  04/21/2017   Other: 04/21/2017     Scribe for Treatment Team: Joanne Chars, LCSW 04/21/2017 11:06 AM

## 2017-04-21 NOTE — [facility] Group Notes (Signed)
  [facility] LCSW Group Therapy Note  Date/Time: 04/21/17, 1315  Type of Therapy/Topic:  Group Therapy:  Emotion Regulation  Participation Level:  Active   Mood:pleasant  Description of Group:    The purpose of this group is to assist patients in learning to regulate negative emotions and experience positive emotions. Patients will be guided to discuss ways in which they have been vulnerable to their negative emotions. These vulnerabilities will be juxtaposed with experiences of positive emotions or situations, and patients challenged to use positive emotions to combat negative ones. Special emphasis will be placed on coping with negative emotions in conflict situations, and patients will process healthy conflict resolution skills.  Therapeutic Goals: 1. Patient will identify two positive emotions or experiences to reflect on in order to balance out negative emotions:  2. Patient will label two or more emotions that they find the most difficult to experience:  3. Patient will be able to demonstrate positive conflict resolution skills through discussion or role plays:   Summary of Patient Progress:Pt shared that helplessness is one emotion that pt finds difficult to experience.  Pt was active in discussion regarding positive ways to deal with difficult emotions and shared a story about getting angry during a Dr appt and saying a number of things that he later regreted.        Therapeutic Modalities:   Cognitive Behavioral Therapy Feelings Identification Dialectical Behavioral Therapy  Lurline Idol, LCSW

## 2017-04-21 NOTE — Progress Notes (Signed)
Recreation Therapy Notes  Date: 04/21/17 Time: 0930 Location: 300 Hall Dayroom  Group Topic: Stress Management  Goal Area(s) Addresses:  Patient will verbalize importance of using healthy stress management.  Patient will identify positive emotions associated with healthy stress management.   Intervention: Stress Management  Activity :  Body Scan.  LRT introduced the stress management technique meditation.  LRT played a body scan meditation to allow patients to examine any sensations or tension they may have been feeling.  Patients were to follow along as the meditation played.  Education:  Stress Management, Discharge Planning.   Education Outcome: Acknowledges edcuation/In group clarification offered/Needs additional education  Clinical Observations/Feedback: Pt did not attend group.    Victorino Sparrow, LRT/CTRS         Ria Comment, Zariana Strub A 04/21/2017 10:45 AM

## 2017-04-21 NOTE — Progress Notes (Signed)
Patient ID: Stephen Herrera, male   DOB: 19-Sep-1955, 62 y.o.   MRN: [id number]  Pt currently presents with a flat affect and anxious behavior. Upon first approach pt reports increased anxiety and shakiness. Pt reports that he just learned his wife contracted HIV during a procedure last year. Pt states "There is nothing I can do about it, I can feel the panic attack coming on." Requests Ativan prn. Pt reports good sleep with current medication regimen.   Pt provided with medications per providers orders. Pt's labs and vitals were monitored throughout the night. Pt given a 1:1 about emotional and mental status. Pt supported and encouraged to express concerns and questions. Pt educated on medications and relaxation techniques, needs reinforcement.   Pt's safety ensured with 15 minute and environmental checks. Pt currently denies SI/HI and A/V hallucinations. Pt verbally agrees to seek staff if SI/HI or A/VH occurs and to consult with staff before acting on any harmful thoughts. Will continue POC.

## 2017-04-21 NOTE — [facility] Group Notes (Signed)
[facility] Group Notes:  (Nursing/MHT/Case Management/Adjunct)  Date:  04/21/2017  Time:  4:00 p.m.  Type of Therapy:  Psychoeducational Skills  Participation Level:  Active  Participation Quality:  Appropriate  Affect:  Appropriate  Cognitive:  Appropriate  Insight:  Appropriate  Engagement in Group:  Engaged  Modes of Intervention:  Education  Summary of Progress/Problems:  Patient actively participated in educational group.    Cammy Copa 04/21/2017, 5:36 PM

## 2017-04-21 NOTE — Progress Notes (Signed)
Adult Psychoeducational Group Note  Date:  04/21/2017 Time:  9:36 AM  Group Topic/Focus:  Goals Group:   The focus of this group is to help patients establish daily goals to achieve during treatment and discuss how the patient can incorporate goal setting into their daily lives to aide in recovery.  Participation Level:  Minimal  Participation Quality:  Redirectable and Resistant  Affect:  Irritable  Cognitive:  Alert  Insight: Limited  Engagement in Group:  Distracting and Off Topic  Modes of Intervention:  Discussion and Education  Additional Comments:  Pt's only concern during group was to get " the proper medicine for my pain, I'm in a lot of pain and they are not taking care of me here".  Stephen Herrera E 04/21/2017, 9:36 AM

## 2017-04-21 NOTE — [facility] Suicide Risk Assessment (Signed)
[facility] Admission Suicide Risk Assessment   Nursing information obtained from:  Patient Demographic factors:  Male, Unemployed, Access to firearms Current Mental Status:  Suicidal ideation indicated by patient, Suicide plan, Intention to act on suicide plan Loss Factors:  Decline in physical health Historical Factors:  Prior suicide attempts Risk Reduction Factors:  Sense of responsibility to family, Positive social support  Total Time spent with patient: 45 minutes Principal Problem: Major depressive disorder, recurrent severe without psychotic features ([place]) Diagnosis:   Patient Active Problem List   Diagnosis Date Noted  . Panic disorder [F41.0] 04/21/2017  . Major depressive disorder, recurrent severe without psychotic features ([place]) [F33.2]   . DM type 2 goal A1C below 7.5 [E11.9]   . Polycythemia [D75.1] 09/16/2014  . DM (diabetes mellitus) ([place]) [E11.9] 06/03/2010  . COPD (chronic obstructive pulmonary disease) ([place]) [J44.9]   . IBS (irritable bowel syndrome) [K58.9]   . Chronic fatigue syndrome [R53.82]   . Chronic pain [G89.29]   . Hyperlipidemia [E78.5]   . CAD (coronary atherosclerotic disease) [I25.10]   . Hypothyroidism [E03.9]    Subjective Data:  62 y.o Caucasian male, lives with his partner, retired. Background history of MDD, Panic disorder, chronic pain and multiple medical comorbidity. Presented to the ER unaccompanied. While at the ER had a full blown panic attack with elevated vital signs, increased perspiration. Reports multiple health stressors lately. Was overwhelmed and held a gun to own head. Later took away his guns and decided to seek help. Routine labs significant for elevated BUN and total protein. ECG shows sinus tachycardia, QTc 459. Toxicology is negative. UDS is THC. BAL<10 mg/dl. Patient is in pain. He has agitated depression. No past suicidal behavior, no family history of suicide, no evidence of psychosis. No evidence of mania. No cognitive impairment. No  access to weapons. He is cooperative with care. He has agreed to treatment recommendations. He has agreed to communicate suicidal thoughts to staff if the thoughts becomes overwhelming.     Continued Clinical Symptoms:  Alcohol Use Disorder Identification Test Final Score (AUDIT): 4 The "Alcohol Use Disorders Identification Test", Guidelines for Use in Primary Care, Second Edition.  World Pharmacologist [facility]). Score between 0-7:  no or low risk or alcohol related problems. Score between 8-15:  moderate risk of alcohol related problems. Score between 16-19:  high risk of alcohol related problems. Score 20 or above:  warrants further diagnostic evaluation for alcohol dependence and treatment.   CLINICAL FACTORS:   Panic Attacks Depression:   Severe   Musculoskeletal: Strength & Muscle Tone: within normal limits Gait & Station: unsteady Patient leans: Right  Psychiatric Specialty Exam: Physical Exam  ROS  Blood pressure 136/76, pulse (!) 103, temperature 97.9 F (36.6 C), temperature source Oral, resp. rate 18, height 5\' 7"  (1.702 m), weight 82.6 kg (182 lb).Body mass index is 28.51 kg/m.  General Appearance: As in H&P  Eye Contact:    Speech:    Volume:    Mood:    Affect:    Thought Process:    Orientation:    Thought Content:    Suicidal Thoughts:  As in H&P  Homicidal Thoughts:    Memory:    Judgement:    Insight:    Psychomotor Activity:    Concentration:    Recall:    Fund of Knowledge:    Language:    Akathisia:    Handed:    AIMS (if indicated):     Assets:    ADL's:  Cognition:  As in H&P  Sleep:  Number of Hours: 6.25      COGNITIVE FEATURES THAT CONTRIBUTE TO RISK:  None    SUICIDE RISK:   Minimal: No identifiable suicidal ideation.  Patients presenting with no risk factors but with morbid ruminations; may be classified as minimal risk based on the severity of the depressive symptoms  PLAN OF CARE:  As in H&P  I certify that inpatient  services furnished can reasonably be expected to improve the patient's condition.   Artist [place], MD 04/21/2017, 12:29 PM

## 2017-04-21 NOTE — Progress Notes (Signed)
D Pt is observed in the dayroom by this Probation officer. When he is not aware staff is observing him, he ambulates semi-fluidly. He does not limp and his back is not visibly bent. A He presented to the med window for his morning meds. HE explains his back situation " I ve got a slipped disc and I have nerve issue" . He requests advil for back pain twice and writer medicated him with advil per his request. He completed his daily assessment and on this he wrote he denied having SI today . HE rated his depression, hopelessness and anxiety " 1/0/8", respectively. R
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
## 2017-04-22 LAB — T4: T4 TOTAL: 7.2 ug/dL (ref 4.5–12.0)

## 2017-04-22 LAB — GLUCOSE, CAPILLARY: Glucose-Capillary: 137 mg/dL — ABNORMAL HIGH (ref 65–99)

## 2017-04-22 LAB — T3: T3 TOTAL: 79 ng/dL (ref 71–180)

## 2017-04-22 MED ORDER — PROPRANOLOL HCL 10 MG PO TABS
10.0000 mg | ORAL_TABLET | Freq: Three times a day (TID) | ORAL | Status: DC
  Administered 2017-04-22 – 2017-04-23 (×3): 10 mg via ORAL
  Filled 2017-04-22 (×16): qty 1

## 2017-04-22 MED ORDER — CANAGLIFLOZIN 100 MG PO TABS
100.0000 mg | ORAL_TABLET | Freq: Every day | ORAL | Status: DC
  Administered 2017-04-23 – 2017-04-27 (×5): 100 mg via ORAL
  Filled 2017-04-22 (×9): qty 1

## 2017-04-22 MED ORDER — IBUPROFEN 600 MG PO TABS
600.0000 mg | ORAL_TABLET | Freq: Three times a day (TID) | ORAL | Status: DC | PRN
  Administered 2017-04-22 – 2017-04-27 (×7): 600 mg via ORAL
  Filled 2017-04-22 (×7): qty 1

## 2017-04-22 MED ORDER — METFORMIN HCL ER 500 MG PO TB24
1000.0000 mg | ORAL_TABLET | Freq: Every day | ORAL | Status: DC
  Administered 2017-04-23 – 2017-04-27 (×5): 1000 mg via ORAL
  Filled 2017-04-22 (×8): qty 2

## 2017-04-22 NOTE — Progress Notes (Signed)
Adult Psychoeducational Group Note  Date:  04/22/2017 Time:  9:14 PM  Group Topic/Focus:  Wrap-Up Group:   The focus of this group is to help patients review their daily goal of treatment and discuss progress on daily workbooks.  Participation Level:  Active  Participation Quality:  Appropriate  Affect:  Appropriate  Cognitive:  Alert and Oriented  Insight: Good  Engagement in Group:  Engaged  Modes of Intervention:  Discussion  Additional Comments:  Pt rated his day a 8.5/10 and stated his goal was to understand his next trigger. Pt was able to speak with the doctor and find some answers to questions they have had for more than a year now. Pt does not know what his goal is for tomorrow.  Milas Kocher 04/22/2017, 9:14 PM
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
## 2017-04-22 NOTE — [facility] Group Notes (Signed)
Orientation / Goal setting   Date:  04/22/2017  Time:  9:54 AM  Type of Therapy:  Nurse Education  /  The group focuses on teaching patients who their staff is, what the staff's responsibilities are and what the unit programming / scheduling looks like. Additionally, SMART goal-setting is introduced.  Participation Level:  Active  Participation Quality:  Appropriate  Affect:  Appropriate  Cognitive:  Alert  Insight:  Good  Engagement in Group:  Engaged  Modes of Intervention:  Education  Summary of Progress/Problems:  Stephen Herrera 04/22/2017, 9:54 AM

## 2017-04-22 NOTE — [facility] Group Notes (Signed)
LCSW Group Therapy Note  04/22/2017 10:00AM - 11:15 - [address], 11:15-12:00 - [address]  Type of Therapy and Topic:  Group Therapy: Anger Cues and Responses  Participation Level:  Active   Description of Group:   In this group, patients learned how to recognize the physical, cognitive, emotional, and behavioral responses they have to anger-provoking situations.  They identified a recent time they became angry and how they reacted.  They analyzed how their reaction was possibly beneficial and how it was possibly unhelpful.  The group discussed a variety of healthier coping skills that could help with such a situation in the future.  Deep breathing was practiced briefly.  Therapeutic Goals: 1. Patients will remember their last incident of anger and how they felt emotionally and physically, what their thoughts were at the time, and how they behaved. 2. Patients will identify how their behavior at that time worked for them, as well as how it worked against them. 3. Patients will explore possible new behaviors to use in future anger situations. 4. Patients will learn that anger itself is normal and cannot be eliminated, and that healthier reactions can assist with resolving conflict rather than worsening situations.  Summary of Patient Progress:  The patient shared that their most recent time of anger was last night and said this was because he got bad information.  He was quite monopolizing throughout group and had to be redirected several times.  Many of his comments were appropriate and he gave a description of some healthy coping skills that assist him; however, he also perseverated on his desire to kill himself because "enough is enough" and he is tired of having to keep trying so hard to be "normal."  Therapeutic Modalities:   Cognitive Behavioral Therapy  Stephen Herrera  04/22/2017 9:04 AM
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
## 2017-04-23 ENCOUNTER — Encounter (HOSPITAL_COMMUNITY): Payer: Self-pay | Admitting: Emergency Medicine

## 2017-04-23 ENCOUNTER — Inpatient Hospital Stay (HOSPITAL_COMMUNITY): Payer: Medicare HMO

## 2017-04-23 LAB — CBC WITH DIFFERENTIAL/PLATELET
Basophils Absolute: 0 10*3/uL (ref 0.0–0.1)
Basophils Relative: 0 %
Eosinophils Absolute: 0.3 10*3/uL (ref 0.0–0.7)
Eosinophils Relative: 3 %
HCT: 44.4 % (ref 39.0–52.0)
Hemoglobin: 15.3 g/dL (ref 13.0–17.0)
LYMPHS ABS: 2.7 10*3/uL (ref 0.7–4.0)
LYMPHS PCT: 29 %
MCH: 30.8 pg (ref 26.0–34.0)
MCHC: 34.5 g/dL (ref 30.0–36.0)
MCV: 89.3 fL (ref 78.0–100.0)
MONO ABS: 0.7 10*3/uL (ref 0.1–1.0)
Monocytes Relative: 7 %
Neutro Abs: 5.7 10*3/uL (ref 1.7–7.7)
Neutrophils Relative %: 61 %
PLATELETS: 245 10*3/uL (ref 150–400)
RBC: 4.97 MIL/uL (ref 4.22–5.81)
RDW: 13.1 % (ref 11.5–15.5)
WBC: 9.5 10*3/uL (ref 4.0–10.5)

## 2017-04-23 LAB — BASIC METABOLIC PANEL
Anion gap: 9 (ref 5–15)
BUN: 21 mg/dL — AB (ref 6–20)
CHLORIDE: 106 mmol/L (ref 101–111)
CO2: 24 mmol/L (ref 22–32)
Calcium: 9.1 mg/dL (ref 8.9–10.3)
Creatinine, Ser: 0.93 mg/dL (ref 0.61–1.24)
GFR calc Af Amer: >60 mL/min (ref >60)
GFR calc non Af Amer: >60 mL/min (ref >60)
GLUCOSE: 124 mg/dL — AB (ref 65–99)
POTASSIUM: 3.9 mmol/L (ref 3.5–5.1)
Sodium: 139 mmol/L (ref 135–145)

## 2017-04-23 LAB — GLUCOSE, CAPILLARY
GLUCOSE-CAPILLARY: 135 mg/dL — AB (ref 65–99)
Glucose-Capillary: 141 mg/dL — ABNORMAL HIGH (ref 65–99)
Glucose-Capillary: 148 mg/dL — ABNORMAL HIGH (ref 65–99)

## 2017-04-23 MED ORDER — LORAZEPAM 2 MG/ML IJ SOLN
INTRAMUSCULAR | Status: AC
  Administered 2017-04-23: 11:00:00
  Filled 2017-04-23: qty 1

## 2017-04-23 MED ORDER — METFORMIN HCL 500 MG PO TABS
500.0000 mg | ORAL_TABLET | Freq: Once | ORAL | Status: DC
  Filled 2017-04-23: qty 1

## 2017-04-23 NOTE — Progress Notes (Signed)
Nutrition Brief Note  Patient identified on the Malnutrition Screening Tool (MST) Report  Weight is stable. No needs identified.  Wt Readings from Last 15 Encounters:  04/20/17 182 lb (82.6 kg)  02/11/16 180 lb (81.6 kg)  02/02/16 180 lb 8 oz (81.9 kg)  08/12/15 189 lb 8 oz (86 kg)  05/28/15 174 lb (78.9 kg)  05/06/15 188 lb (85.3 kg)  05/05/15 188 lb 12.8 oz (85.6 kg)  04/17/15 185 lb 3.2 oz (84 kg)  02/26/15 186 lb 1.6 oz (84.4 kg)  01/27/15 189 lb 8 oz (86 kg)  01/20/15 188 lb 11.2 oz (85.6 kg)  12/23/14 192 lb 11.2 oz (87.4 kg)  11/26/14 195 lb 3.2 oz (88.5 kg)  11/14/14 189 lb (85.7 kg)  10/29/14 193 lb (87.5 kg)    Body mass index is 28.51 kg/m. Patient meets criteria for overweight based on current BMI.   Labs and medications reviewed.   No nutrition interventions warranted at this time. If nutrition issues arise, please consult RD.   Clayton Bibles, MS, RD, [place] Dietitian Pager: [phone] After Hours Pager: [phone]
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
## 2017-04-23 NOTE — Discharge Instructions (Addendum)
Please schedule an appointment with your neurologist to follow up on your possible seizure today. The CT scan of your head is normal. Your labs are reassuring. Return to the ER for new or concerning symptoms.
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
## 2017-04-23 NOTE — [facility] Group Notes (Signed)
Identifying Needs / Goals   Date:  04/23/2017  Time:  10:24 AM  Type of Therapy:  Nurse Education  /  The group is focused on educating patients how to identify their needs , how to develop healthy skills needed to get these mneeds met and how to communiate this ina  Healthy way, in order to get their needs met.   Participation Level:  Active  Participation Quality:  Appropriate  Affect:  Appropriate  Cognitive:  Alert  Insight:  Improving  Engagement in Group:  Engaged  Modes of Intervention:  Education  Summary of Progress/Problems:  Stephen Herrera 04/23/2017, 10:24 AM
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
## 2017-04-23 NOTE — ED Notes (Signed)
Bed: WA22 Expected date:  Expected time:  Means of arrival:  Comments: EMS-from [facility]- 

## 2017-04-23 NOTE — ED Notes (Signed)
Report called to [facility] 

## 2017-04-23 NOTE — Progress Notes (Signed)
Patient ID: Stephen Herrera, male   DOB: 1955/07/20, 62 y.o.   MRN: [id number]  D: Patient in dayroom playing cards with peers. pt reports his day got better after he had the chance to talk to his doctor. Pt mood and affect appeared depress.  Pt reports he is tolerating medication well. Pt denies SI/HI/AVH. Pt attended and participated in evening wrap up group. Cooperative with assessment.    A: Medications administered as prescribed. Support and encouragement provided to attend groups and engage in milieu. Pt encouraged to discuss feelings and come to staff with any question or concerns.   R: Patient remains safe and complaint with medications.

## 2017-04-23 NOTE — [facility] Group Notes (Signed)
Identifying Needs   Date:  04/23/2017  Time:  7:36 AM  Type of Therapy:  Nurse Education  : The group is focused on educating patients how to identify their needs and healthy ways to communicate in order to get their needs met.  Participation Level:  Active  Participation Quality:  Attentive  Affect:  Appropriate  Cognitive:  Appropriate  Insight:  Appropriate  Engagement in Group:  Engaged  Modes of Intervention:  Education  Summary of Progress/Problems:  Lauralyn Primes 04/23/2017, 7:36 AM

## 2017-04-23 NOTE — ED Triage Notes (Signed)
Patient here via EMS from [facility] with reports of seizure like activity.

## 2017-04-24 LAB — GLUCOSE, CAPILLARY
Glucose-Capillary: 169 mg/dL — ABNORMAL HIGH (ref 65–99)
Glucose-Capillary: 128 mg/dL — ABNORMAL HIGH (ref 65–99)
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
## 2017-04-24 NOTE — Progress Notes (Signed)
Recreation Therapy Notes  Date: 04/24/17 Time: 0930 Location: 300 Hall Dayroom  Group Topic: Stress Management  Goal Area(s) Addresses:  Patient will verbalize importance of using healthy stress management.  Patient will identify positive emotions associated with healthy stress management.   Intervention: Stress Management  Activity :  Guided Imagery.  LRT introduced the stress management technique of guided imagery.  LRT read a script that allowed patients to envision being on the beach.  Patients were to follow along as the script was read to engage in the activity.  Education: Stress Management, Discharge Planning.   Education Outcome: Acknowledges edcuation/In group clarification offered/Needs additional education  Clinical Observations/Feedback: Pt did not attend group.     Victorino Sparrow, LRT/CTRS          Victorino Sparrow A 04/24/2017 12:05 PM

## 2017-04-24 NOTE — Progress Notes (Signed)
Patient ID: Stephen Herrera, male   DOB: 10-May-1955, 62 y.o.   MRN: [id number]  Stephen Herrera refused his 1200 scheduled Inderal.

## 2017-04-24 NOTE — [facility] Group Notes (Signed)
LCSW Group Therapy Note 04/24/2017 3:31 PM  Type of Therapy and Topic: Group Therapy: Overcoming Obstacles  Participation Level: Did Not Attend  Description of Group:  In this group patients will be encouraged to explore what they see as obstacles to their own wellness and recovery. They will be guided to discuss their thoughts, feelings, and behaviors related to these obstacles. The group will process together ways to cope with barriers, with attention given to specific choices patients can make. Each patient will be challenged to identify changes they are motivated to make in order to overcome their obstacles. This group will be process-oriented, with patients participating in exploration of their own experiences as well as giving and receiving support and challenge from other group members.  Therapeutic Goals: 1. Patient will identify personal and current obstacles as they relate to admission. 2. Patient will identify barriers that currently interfere with their wellness or overcoming obstacles.  3. Patient will identify feelings, thought process and behaviors related to these barriers. 4. Patient will identify two changes they are willing to make to overcome these obstacles:   Summary of Patient Progress  Invited, chose not to attend.    Therapeutic Modalities:  Cognitive Behavioral Therapy Solution Focused Therapy Motivational Interviewing Relapse Prevention Therapy   Theresa Duty Clinical Social Worker

## 2017-04-24 NOTE — Progress Notes (Signed)
Patient ID: KELDON LASSEN, male   DOB: [birth year], 62 y.o.   MRN: [id number]  Pt currently presents with a flat affect and guarded behavior. Pt reports to writer that their goal is to "figure out what is going on with me." Reports ongoing anxiety, easily agitated when requests are denied. Ruminates about with earlier events, clarifies that "I did not take my medications because I wasn't sure which one caused it and I wanted to talk to the doc first." Pt uses wheelchair to ambulate on the unit tonight. Pt reports good sleep with current medication regimen.   Pt provided with medications per providers orders. Pt's labs and vitals were monitored throughout the night. Pt given a 1:1 about emotional and mental status. Pt supported and encouraged to express concerns and questions. Pt educated on medications, fall risk safety and assertiveness tehcniques.   Pt's safety ensured with 15 minute and environmental checks. Pt currently denies SI/HI and A/V hallucinations. Pt verbally agrees to seek staff if SI/HI or A/VH occurs and to consult with staff before acting on any harmful thoughts. Will continue POC.

## 2017-04-24 NOTE — Progress Notes (Signed)
Adult Psychoeducational Group Note  Date:  04/24/2017 Time:  9:01 PM  Group Topic/Focus:  Wrap-Up Group:   The focus of this group is to help patients review their daily goal of treatment and discuss progress on daily workbooks.  Participation Level:  Active  Participation Quality:  Appropriate  Affect:  Appropriate  Cognitive:  Appropriate  Insight: Appropriate  Engagement in Group:  Engaged  Modes of Intervention:  Discussion  Additional Comments:  The patient expressed that he attended group.  Nash Shearer 04/24/2017, 9:01 PM

## 2017-04-24 NOTE — Progress Notes (Signed)
Late entry for 1930 04/23/2017 D Pt returned  To [facility] today at 1626 via [facility] transport via wheelchair from [facility] ED  after having witnessed seizure with loss of consciousness on the Adult Unit [facility]  approx 1045 this AM. Verbal report from Elvina Sidle ED RN is " all workup was negative". Pt is alert and oriented x4, he is upset " they didn't find anything wrong with me .Marland KitchenI've been going to the doctor for a year and a half for this and nobody believes me...they think I'm lying.". Pt CBG checked at 1705 148 and RN unable to administer 1700 dose of glucophage ( that pt states is due) because electronic chart is unaccessible by Probation officer.  Corporate treasurer ED to initiate transfer of electronic medical record back to [facility]- so Probation officer can access patient's medical record and this is in progress.Patient offered support and requested to sit in his room this pm.

## 2017-04-24 NOTE — Progress Notes (Signed)
Patient ID: Stephen Herrera, male   DOB: [birth year], 61 y.o.   MRN: [id number]  Patient refused scheduled 0800 medications including lidocaine patch although he reported pain. MD Izediuno notified during treatment team this morning.

## 2017-04-24 NOTE — Progress Notes (Signed)
Patient ID: Stephen Herrera, male   DOB: Dec 17, 1955, 62 y.o.   MRN: [id number]  Stephen Herrera came to Probation officer before lunch asking to get money out of his locker to give to another male patient. Writer reminded Stephen Herrera that he signed a treatment agreement stating, "I will not buy, sell, trade, barter, or give away personal items." Stephen Herrera verbalized understanding stating, "okay okay."

## 2017-04-24 NOTE — Progress Notes (Signed)
Patient ID: Stephen Herrera, male   DOB: 08/22/1955, 62 y.o.   MRN: [id number]  D: Patient has been his room talking to roommate most of the evening. Pt asked if he can take metformin which was scheduled for before dinner. Medication had been d/c by pharmacy. Pt became angry stating everyone here have changed his medications and staff will not let him take his home medication which is locked up. NP notified new order of 500 metformin given. Pt angrily refused all her scheduled medications.   Pt denies SI/HI/AVH. Pt attended and participated in evening wrap up group. Cooperative with assessment.    A: Support and encouragement provided as needed. Pt encouraged to discuss feelings and come to staff with any question or concerns.   R: Patient remains safe and complaint with medications.

## 2017-04-24 NOTE — Progress Notes (Signed)
Adult Psychoeducational Group Note  Date:  04/24/2017 Time:  6:47 PM  Group Topic/Focus:  Healthy Communication:   The focus of this group is to discuss communication, barriers to communication, as well as healthy ways to communicate with others.  Participation Level:  Active  Participation Quality:  Appropriate  Affect:  Appropriate  Cognitive:  Alert and Appropriate  Insight: Appropriate, Good and Improving  Engagement in Group:  Engaged  Modes of Intervention:  Activity and Discussion  Additional Comments:  Pt did attend group today as well as participated in all group activities and discussions.  Marjory Meints R Thereasa Iannello 04/24/2017, 6:47 PM

## 2017-04-24 NOTE — Progress Notes (Signed)
Patient ID: Stephen Herrera, male   DOB: [birth year], 62 y.o.   MRN: [id number]  D: Patient has been in his room talking to his roommate most of the evening. Pt asked if he can take metformin which was scheduled for before dinner. Medication had been d/c by pharmacy. Pt became angry stating everyone here have changed his medications and staff will not let him take his home medication which is locked up. NP notified new order of 500 metformin given. Pt angrily refused all her scheduled medications.   Pt denies SI/HI/AVH. Pt attended and participated in evening wrap up group. Cooperative with assessment.    A: Support and encouragement provided as needed. Pt encouraged to discuss feelings and come to staff with any question or concerns.   R: Patient remains safe on the unit.

## 2017-04-24 NOTE — [facility] Group Notes (Signed)
[facility] Group Notes:  Self Care   Date:  04/24/2017  Time:  11:14 AM  Type of Therapy:  Psychoeducational Skills  Participation Level:  Active  Participation Quality:  Appropriate and Attentive  Affect:  Blunted  Cognitive:  Alert and Appropriate  Insight:  Appropriate  Engagement in Group:  Engaged  Modes of Intervention:  Activity, Discussion and Education  Summary of Progress/Problems: Patient came to group and participated. He worked on his self care activity sheet. He chose not to share his areas that he is doing well in with self care or those he needs to focus his attention on.   Hebert Dooling E 04/24/2017, 11:14 AM

## 2017-04-24 NOTE — Progress Notes (Signed)
Patient ID: Stephen Herrera, male   DOB: 16-May-1955, 62 y.o.   MRN: [id number]  DAR: Pt. Denies HI and A/V Hallucinations. He does verbalize SI this morning, he is able to contract for safety. He reports that his sleep last night was poor, his appetite is poor, his energy level is low, and his concentration is good. He rates his depression level is 0/10, his hopelessness level is 10/10, and anxiety level is 2/10. Patient does report pain but is currently refusing medication. Support and encouragement provided to the patient. Scheduled medications administered to patient per physician's orders. Patient is seen in the milieu throughout the day. He is utilizing a wheelchair to aide in ambulation. He voices frustration surrounding not knowing why he had seizure like activity yesterday. He is irritable this morning but more pleasant as the day progresses.Q15 minute checks are maintained for safety.

## 2017-04-25 DIAGNOSIS — Z87891 Personal history of nicotine dependence: Secondary | ICD-10-CM

## 2017-04-25 DIAGNOSIS — G4700 Insomnia, unspecified: Secondary | ICD-10-CM

## 2017-04-25 DIAGNOSIS — F419 Anxiety disorder, unspecified: Secondary | ICD-10-CM

## 2017-04-25 LAB — GLUCOSE, CAPILLARY
GLUCOSE-CAPILLARY: 119 mg/dL — AB (ref 65–99)
Glucose-Capillary: 171 mg/dL — ABNORMAL HIGH (ref 65–99)

## 2017-04-25 MED ORDER — TRAZODONE HCL 50 MG PO TABS
50.0000 mg | ORAL_TABLET | Freq: Every evening | ORAL | Status: DC | PRN
  Administered 2017-04-25 – 2017-04-26 (×2): 50 mg via ORAL
  Filled 2017-04-25 (×2): qty 1

## 2017-04-25 NOTE — Plan of Care (Signed)
Patient verbalizes understanding of information, education provided. 

## 2017-04-25 NOTE — Progress Notes (Signed)
D: Pt was in the dayroom upon initial approach.  Pt presents with appropriate affect and mood.  He reports his day was "good" and his goal was to "talk to the doctor."  He reports the talk went "better than expected" and he describes the discussion as the best part of his day.  Pt denies SI/HI, denies hallucinations, denies pain.  Pt has been visible in milieu interacting with peers and staff appropriately.  Pt attended evening group.  He has been using a wheelchair for ambulation tonight.   A: Introduced self to pt.  Actively listened to pt and offered support and encouragement.  PRN medication administered for pain and sleep.  Fall prevention techniques reviewed with pt and pt verbalized understanding.  Q15 minute safety checks maintained.  R: Pt is safe on the unit.  Pt is compliant with medications.  Pt verbally contracts for safety.  Will continue to monitor and assess.
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
## 2017-04-25 NOTE — Plan of Care (Signed)
  Progressing Safety: Periods of time without injury will increase 04/25/2017 2338 - Progressing by Karie Kirks, RN Note Pt has not harmed self or others tonight.  He denies SI/HI and verbally contracts for safety.

## 2017-04-25 NOTE — [facility] Group Notes (Signed)
[facility] Group Notes:  (Nursing/MHT/Case Management/Adjunct)  Date:  04/25/2017  Time:  1515  Type of Therapy:  Nurse Education - Therapeutic Activity  Participation Level:  Active  Participation Quality:  Appropriate  Affect:  Appropriate  Cognitive:  Alert  Insight:  Improving  Engagement in Group:  Engaged  Modes of Intervention:  Activity  Summary of Progress/Problems: Patient participated in therapy ball activity. Shared with group. Pleasant mood and verbalized enjoyment of group.   Loletta Specter [facility] 04/25/2017, 4:40 PM

## 2017-04-25 NOTE — B[facility] Group Notes (Signed)
LCSW Group Therapy Note 04/25/2017 12:28 PM  Type of Therapy and Topic: Group Therapy: Avoiding Self-Sabotaging and Enabling Behaviors  Participation Level: Active  Description of Group:  In this group, patients will learn how to identify obstacles, self-sabotaging and enabling behaviors, as well as: what are they, why do we do them and what needs these behaviors meet. Discuss unhealthy relationships and how to have positive healthy boundaries with those that sabotage and enable. Explore aspects of self-sabotage and enabling in yourself and how to limit these self-destructive behaviors in everyday life.  Therapeutic Goals: 1. Patient will identify one obstacle that relates to self-sabotage and enabling behaviors 2. Patient will identify one personal self-sabotaging or enabling behavior they did prior to admission 3. Patient will state a plan to change the above identified behavior 4. Patient will demonstrate ability to communicate their needs through discussion and/or role play.   Summary of Patient Progress:  Stephen Herrera was engaged throughout and participated throughout today's group session. Stephen Herrera reports that self sabotaging to him was "doing anything that is detrimental to your overall health". Stephen Herrera states that he struggles with overreacting and isolation. He states that those are his main self sabotaging behaviors and that in the future he plans to be more reflective so that he can correct his behaviors.    Therapeutic Modalities:  Cognitive Behavioral Therapy Person-Centered Therapy Motivational Interviewing   [place] Clinical Social Worker

## 2017-04-25 NOTE — Progress Notes (Addendum)
D: Patient observed up and visible in the milieu. Patient states he continues to be frustrated with his metformin dosing. States it should be XL prn per his outpatient MD's prescribing. Patient's affect irritable with congruent mood. Per self inventory and discussions with writer, rates depression, hopelessness and anxiety all at a 0/10. Rates sleep as poor, appetite asgood, energy as normal and concentration as good.  States goal for today is "finding out what is physically wrong with me!" Reports chronic abd pain radiating to groin which has been an ongoing issue. No other physical complaints. No reported or observed seizure activity. Ambulating in wheelchair.  A: Medicated per orders though patient refused lidocaine patch. "It doesn't work." Tylenol prn given for discomfort. Level III obs in place for safety. Emotional support offered and self inventory reviewed. Encouraged completion of Suicide Safety Plan and programming participation. Discussed POC with MD, SW.  Fall prevention plan in place and reviewed with patient as pt is a high fall risk due to chronic pain, reported seizure activity over the weekend (patient was med cleared.)   R: Patient verbalizes understanding of POC, falls prevention education. On reassess, patient rates pain is a 7/10. Patient denies SI/HI/AVH and remains safe on level III obs. Will continue to monitor closely and make verbal contact frequently.

## 2017-04-26 DIAGNOSIS — Z736 Limitation of activities due to disability: Secondary | ICD-10-CM

## 2017-04-26 LAB — GLUCOSE, CAPILLARY
Glucose-Capillary: 139 mg/dL — ABNORMAL HIGH (ref 65–99)
Glucose-Capillary: 139 mg/dL — ABNORMAL HIGH (ref 65–99)

## 2017-04-26 NOTE — B[facility] Group Notes (Signed)
[facility] Group Notes:  (Nursing/MHT/Case Management/Adjunct)  Date:  04/26/2017  Time:  1:30 p.m.  Type of Therapy:  Psychoeducational Skills  Participation Level:  Active  Participation Quality:  Appropriate  Affect:  Appropriate  Cognitive:  Appropriate  Insight:  Appropriate  Engagement in Group:  Engaged  Modes of Intervention:  Education  Summary of Progress/Problems:  Patient responded appropriately and actively participated in this group.    Cammy Copa 04/26/2017, 3:30 PM

## 2017-04-26 NOTE — BHH Group Notes (Signed)
[facility] Group Notes:  (Nursing/MHT/Case Management/Adjunct)  Date:  04/26/2017  Time:  4:15 PM  Type of Therapy:  Psychoeducational Skills  Participation Level:  Active  Participation Quality:  Appropriate and Attentive  Affect:  Blunted  Cognitive:  Alert and Appropriate  Insight:  Lacking  Engagement in Group:  Engaged  Modes of Intervention:  Activity, Discussion and Education  Summary of Progress/Problems: Patient attended group and participated.

## 2017-04-26 NOTE — Progress Notes (Signed)
Adult Psychoeducational Group Note  Date:  04/26/2017 Time:  1:41 AM  Group Topic/Focus:  Wrap-Up Group:   The focus of this group is to help patients review their daily goal of treatment and discuss progress on daily workbooks.  Participation Level:  Active  Participation Quality:  Appropriate and Attentive  Affect:  Appropriate  Cognitive:  Alert and Appropriate  Insight: Appropriate  Engagement in Group:  Engaged  Modes of Intervention:  Discussion, Education, Socialization and Support  Additional Comments:  Pt rated his day at a 5 out of 73. Pt stated he is working towards arranging plans following his discharge and was pleased about being able to have a discussion with MD about it.  Rocky Crafts 04/26/2017, 1:41 AM

## 2017-04-26 NOTE — B[facility] Group Notes (Signed)
[facility] Group Therapy 04/26/2017 1:15pm  Type of Therapy: Mental Health Association Presentation  Participation Level: Active  Participation Quality: Attentive  Affect: Appropriate  Cognitive: Oriented  Insight: Developing/Improving  Engagement in Therapy: Engaged  Modes of Intervention: Discussion, Education and Socialization  Summary of Progress/Problems: [place] Speaker came to talk about his personal journey with mental health. The pt processed ways by which to relate to the speaker. [place] speaker provided handouts and educational information pertaining to groups and services offered by the [facility]. Pt was engaged in speaker's presentation and was receptive to resources provided.    [employer], LCSW 04/26/2017 12:50 PM

## 2017-04-26 NOTE — Progress Notes (Signed)
Pt presents with an animated affect and anxious mood. Pt reported decreased depression and anxiety today. Pt expressed that his chronic pain frustrates him because he continues to be in pain. Pt refused lidocaine patch when offered. Pt stated that the lidocaine patch doesn't work for him. Pt denies SI. Pt reports fair sleep at bedtime. Medications reviewed with pt. Medications administered as ordered per MD. Verbal support provided. Pt encouraged to attend groups. 15 minute checks performed for safety.

## 2017-04-26 NOTE — Progress Notes (Signed)
D   Pt is pleasant and cooperative    He was upset about another patient taking the puzzle he had won in bingo earlier but said he was trying not to let that ruin his night   He said he is ready for discharge tomorrow and he feels so much better A     Verbal support given    Medications administered and effectiveness monitored    Returned pt puzzle to him   Q 15 min checks R   Pt is safe at present time

## 2017-04-26 NOTE — Tx Team (Signed)
Interdisciplinary Treatment and Diagnostic Plan Update  04/26/2017 Time of Session: [place] MRN: [id number]  Principal Diagnosis: Major depressive disorder, recurrent severe without psychotic features [facility])  Secondary Diagnoses: Principal Problem:   Major depressive disorder, recurrent severe without psychotic features ([place]) Active Problems:   Panic disorder   Current Medications:  Current Facility-Administered Medications  Medication Dose Route Frequency Provider Last Rate Last Dose  . acetaminophen (TYLENOL) tablet 650 mg  650 mg Oral Q4H PRN Patrecia Pour, NP   650 mg at 04/25/17 2201  . alum & mag hydroxide-simeth (MAALOX/MYLANTA) 200-200-20 MG/5ML suspension 30 mL  30 mL Oral Q6H PRN Patrecia Pour, NP      . aspirin EC tablet 81 mg  81 mg Oral Daily Patrecia Pour, NP   81 mg at 04/26/17 0749  . metFORMIN (GLUCOPHAGE-XR) 24 hr tablet 1,000 mg  1,000 mg Oral Q breakfast Izediuno, Vincent A, MD   1,000 mg at 04/26/17 0610   And  . canagliflozin [facility]) tablet 100 mg  100 mg Oral QAC breakfast Izediuno, Laruth Bouchard, MD   100 mg at 04/26/17 0610  . hydrOXYzine (ATARAX/VISTARIL) tablet 50 mg  50 mg Oral TID PRN Patrecia Pour, NP   50 mg at 04/22/17 2249  . ibuprofen (ADVIL,MOTRIN) tablet 600 mg  600 mg Oral TID PRN Artist Beach, MD   600 mg at 04/26/17 0518  . lidocaine (LIDODERM) 5 % 1 patch  1 patch Transdermal Daily Izediuno, Laruth Bouchard, MD   1 patch at 04/23/17 0824  . LORazepam (ATIVAN) tablet 1 mg  1 mg Oral Q6H PRN Artist Beach, MD   1 mg at 04/21/17 2040  . magnesium hydroxide (MILK OF MAGNESIA) suspension 30 mL  30 mL Oral Daily PRN Patrecia Pour, NP      . metFORMIN (GLUCOPHAGE) tablet 500 mg  500 mg Oral Once [name] A, NP      . ondansetron (ZOFRAN) tablet 4 mg  4 mg Oral Q8H PRN Patrecia Pour, NP      . traZODone (DESYREL) tablet 50 mg  50 mg Oral QHS PRN Cobos, Myer Peer, MD   50 mg at 04/25/17 2114  . venlafaxine XR (EFFEXOR-XR)  24 hr capsule 75 mg  75 mg Oral QPC breakfast Izediuno, Laruth Bouchard, MD   75 mg at 04/26/17 0749   PTA Medications: Medications Prior to Admission  Medication Sig Dispense Refill Last Dose  . aspirin 81 MG tablet Take 1 tablet (81 mg total) by mouth daily. 30 tablet 0 04/19/2017 at Unknown time  . Empagliflozin-metFORMIN HCl ER (SYNJARDY XR) 25-1000 MG TB24 Take 1 tablet by mouth daily with breakfast. Hold if blood sugar is <115   Past Week at Unknown time  . metFORMIN (GLUCOPHAGE) 500 MG tablet Take 1,000 mg by mouth daily with supper.   Past Week at Unknown time  . naproxen sodium (ALEVE) 220 MG tablet Take 440 mg by mouth daily as needed (PAIN).   04/18/2017 at Unknown time    Patient Stressors: Health problems Substance abuse  Patient Strengths: Ability for insight Capable of independent living  Treatment Modalities: Medication Management, Group therapy, Case management,  1 to 1 session with clinician, Psychoeducation, Recreational therapy.   Physician Treatment Plan for Primary Diagnosis: Major depressive disorder, recurrent severe without psychotic features ([place]) Long Term Goal(s): Improvement in symptoms so as ready for discharge Improvement in symptoms so as ready for discharge   Short Term Goals:  Ability to identify changes in lifestyle to reduce recurrence of condition will improve Ability to verbalize feelings will improve Ability to disclose and discuss suicidal ideas Ability to demonstrate self-control will improve Ability to identify and develop effective coping behaviors will improve Ability to maintain clinical measurements within normal limits will improve Compliance with prescribed medications will improve Ability to identify changes in lifestyle to reduce recurrence of condition will improve Ability to verbalize feelings will improve Ability to disclose and discuss suicidal ideas Ability to demonstrate self-control will improve Ability to identify and develop  effective coping behaviors will improve Ability to maintain clinical measurements within normal limits will improve Compliance with prescribed medications will improve  Medication Management: Evaluate patient's response, side effects, and tolerance of medication regimen.  Therapeutic Interventions: 1 to 1 sessions, Unit Group sessions and Medication administration.  Evaluation of Outcomes: Progressing  Physician Treatment Plan for Secondary Diagnosis: Principal Problem:   Major depressive disorder, recurrent severe without psychotic features ([place]) Active Problems:   Panic disorder  Long Term Goal(s): Improvement in symptoms so as ready for discharge Improvement in symptoms so as ready for discharge   Short Term Goals: Ability to identify changes in lifestyle to reduce recurrence of condition will improve Ability to verbalize feelings will improve Ability to disclose and discuss suicidal ideas Ability to demonstrate self-control will improve Ability to identify and develop effective coping behaviors will improve Ability to maintain clinical measurements within normal limits will improve Compliance with prescribed medications will improve Ability to identify changes in lifestyle to reduce recurrence of condition will improve Ability to verbalize feelings will improve Ability to disclose and discuss suicidal ideas Ability to demonstrate self-control will improve Ability to identify and develop effective coping behaviors will improve Ability to maintain clinical measurements within normal limits will improve Compliance with prescribed medications will improve     Medication Management: Evaluate patient's response, side effects, and tolerance of medication regimen.  Therapeutic Interventions: 1 to 1 sessions, Unit Group sessions and Medication administration.  Evaluation of Outcomes: Progressing   RN Treatment Plan for Primary Diagnosis: Major depressive disorder, recurrent severe  without psychotic features ([place]) Long Term Goal(s): Knowledge of disease and therapeutic regimen to maintain health will improve  Short Term Goals: Ability to identify and develop effective coping behaviors will improve and Compliance with prescribed medications will improve  Medication Management: RN will administer medications as ordered by provider, will assess and evaluate patient's response and provide education to patient for prescribed medication. RN will report any adverse and/or side effects to prescribing provider.  Therapeutic Interventions: 1 on 1 counseling sessions, Psychoeducation, Medication administration, Evaluate responses to treatment, Monitor vital signs and CBGs as ordered, Perform/monitor CIWA, COWS, AIMS and Fall Risk screenings as ordered, Perform wound care treatments as ordered.  Evaluation of Outcomes: Progressing   LCSW Treatment Plan for Primary Diagnosis: Major depressive disorder, recurrent severe without psychotic features ([place]) Long Term Goal(s): Safe transition to appropriate next level of care at discharge, Engage patient in therapeutic group addressing interpersonal concerns.  Short Term Goals: Engage patient in aftercare planning with referrals and resources, Increase social support and Increase skills for wellness and recovery  Therapeutic Interventions: Assess for all discharge needs, 1 to 1 time with Social worker, Explore available resources and support systems, Assess for adequacy in community support network, Educate family and significant other(s) on suicide prevention, Complete Psychosocial Assessment, Interpersonal group therapy.  Evaluation of Outcomes: Progressing   Progress in Treatment: Attending groups: Yes Participating in groups:  Yes Taking medication as prescribed: Yes. Toleration medication: Yes. Family/Significant other contact made: No, will contact:  pt refused consent. Patient understands diagnosis: Yes. Discussing patient  identified problems/goals with staff: Yes. Medical problems stabilized or resolved: Yes. Denies suicidal/homicidal ideation: Yes. Issues/concerns per patient self-inventory: Yes. Other: none  New problem(s) identified: No, Describe:  none  New Short Term/Long Term Goal(s):Pt goal: "learn how to cope with things I can't control."  Discharge Plan or Barriers: Follow up with PCP.  Reason for Continuation of Hospitalization: Depression Suicidal ideation  Estimated Length of Stay: 1 day  Attendees: Patient: 04/26/2017   Physician: Dr. Parke Poisson, MD 04/26/2017   Nursing: Grayland Ormond, RN 04/26/2017   RN Care Manager: 04/26/2017   Social Worker: Lurline Idol, LCSW 04/26/2017   Recreational Therapist:  04/26/2017   Other:  04/26/2017   Other:  04/26/2017   Other: 04/26/2017        Scribe for Treatment Team: Joanne Chars, LCSW 04/26/2017 11:38 AM
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
## 2017-04-27 LAB — GLUCOSE, CAPILLARY: Glucose-Capillary: 124 mg/dL — ABNORMAL HIGH (ref 65–99)

## 2017-04-27 MED ORDER — TRAZODONE HCL 50 MG PO TABS: 50.0000 mg | ORAL_TABLET | Freq: Every evening | ORAL | 0 refills | Status: AC | PRN

## 2017-04-27 MED ORDER — VENLAFAXINE HCL ER 75 MG PO CP24: 75.0000 mg | ORAL_CAPSULE | Freq: Every day | ORAL | 0 refills | Status: DC

## 2017-04-27 MED ORDER — HYDROXYZINE HCL 50 MG PO TABS: 50.0000 mg | ORAL_TABLET | Freq: Three times a day (TID) | ORAL | 0 refills | Status: DC | PRN

## 2017-04-27 NOTE — Progress Notes (Signed)
Pt received both written and verbal discharge instructions. Pt verbalized understanding of discharge instructions. Pt agreed to f/u appt and med regimen. Pt received d/c packet and prescriptions. Pt gathered belongings from room and locker. Pt safely discharged to the lobby. 

## 2017-04-27 NOTE — [facility] Group Notes (Signed)
Adult Psychoeducational Group Note  Date:  04/27/2017 Time:  12:02 AM  Group Topic/Focus:  Wrap-Up Group:   The focus of this group is to help patients review their daily goal of treatment and discuss progress on daily workbooks.  Participation Level:  Active  Participation Quality:  Appropriate  Affect:  Appropriate  Cognitive:  Alert and Oriented  Insight: Appropriate  Engagement in Group:  Engaged  Modes of Intervention:  Exploration and Support  Additional Comments:  Pt rated his day an 8 or 9. Pt verbalized that something positive that happened was that he was able to see the gentleman with [facility] [employer].  Pt verbalized that another positive was that he gets to leave tomorrow and that a place was found.  Pt reported that positive coping strategies that he can use are affirmations, finding a quiet place, and breathing.   Fuquan Wilson, [place] 04/27/2017, 12:02 AM

## 2017-04-27 NOTE — Progress Notes (Signed)
  [facility] Adult Case Management Discharge Plan :  Will you be returning to the same living situation after discharge:  Yes,  with his wife At discharge, do you have transportation home?: Yes,  patient reports his car is in the [facility] parking lot Do you have the ability to pay for your medications: Yes,  [employer] MEDICARE  Release of information consent forms completed and in the chart;  Patient's signature needed at discharge.  Patient to Follow up at: Follow-up Information    Lavone Orn, MD Follow up in 19 day(s).   Specialty:  Internal Medicine Why:  Appointment is 05/09/17 at 11:00am with Dr. Lavone Orn.  Contact information: [address] [place] [address] [phone]        BEHAVIORAL HEALTH OUTPATIENT THERAPY [place] Follow up.   Specialty:  Behavioral Health Why:  Partial Hospitalization Program appointment is 05-01-2017 at 1:30pm.  Contact information: [place] [phone] [place] [phone]          Next level of care provider has access to [place] and Suicide Prevention discussed: Yes,  with the patient.   Have you used any form of tobacco in the last 30 days? (Cigarettes, Smokeless Tobacco, Cigars, and/or Pipes): No  Has patient been referred to the Quitline?: N/A patient is not a smoker  Patient has been referred for addiction treatment: [place], [place] 04/27/2017, 11:23 AM

## 2017-04-27 NOTE — [facility] Suicide Risk Assessment (Signed)
[facility] INPATIENT:  Family/Significant Other Suicide Prevention Education  Suicide Prevention Education:   SPE completed with patient, as patient refused to consent to family contact. SPI pamphlet provided to pt and pt was encouraged to share information with support network, ask questions, and talk about any concerns relating to SPE. Patient denies access to guns/firearms and verbalized understanding of information provided. Mobile Crisis information also provided to patient.   

## 2017-04-27 NOTE — [facility] Suicide Risk Assessment (Signed)
[facility] Discharge Suicide Risk Assessment   Principal Problem: Major depressive disorder, recurrent severe without psychotic features [facility]) Discharge Diagnoses:  Patient Active Problem List   Diagnosis Date Noted  . Panic disorder [F41.0] 04/21/2017  . Major depressive disorder, recurrent severe without psychotic features ([place]) [F33.2]   . DM type 2 goal A1C below 7.5 [E11.9]   . Polycythemia [D75.1] 09/16/2014  . DM (diabetes mellitus) ([place]) [E11.9] 06/03/2010  . COPD (chronic obstructive pulmonary disease) ([place]) [J44.9]   . IBS (irritable bowel syndrome) [K58.9]   . Chronic fatigue syndrome [R53.82]   . Chronic pain [G89.29]   . Hyperlipidemia [E78.5]   . CAD (coronary atherosclerotic disease) [I25.10]   . Hypothyroidism [E03.9]     Total Time spent with patient: 30 minutes  Musculoskeletal: Strength & Muscle Tone: within normal limits Gait & Station: normal  Mobilizing in wheel chair on unit, states he walks with cane at home  Patient leans: N/A   Psychiatric Specialty Exam: ROS denies chest pain , denies shortness of breath, no vomiting   Blood pressure 137/74, pulse 83, temperature 98.6 F (37 C), temperature source Oral, resp. rate 18, height 5\' 7"  (1.702 m), weight 82.6 kg (182 lb), SpO2 96 %.Body mass index is 28.51 kg/m.  General Appearance: Well Groomed  Eye Contact::  Good  Speech:  Normal Rate409  Volume:  Normal  Mood:  reports mood has improved , states " I feel better "  Affect:  reactive, smiles at times appropriately   Thought Process:  Linear and Descriptions of Associations: Intact  Orientation:  Other:  fully alert and attentive   Thought Content:  no hallucinations, no delusions, not internally preoccupied   Suicidal Thoughts:  No denies suicidal or self injurious ideations, denies homicidal ideations   Homicidal Thoughts:  No  Memory:  recent and remote grossly intact   Judgement:  Other:  improving   Insight:  improving   Psychomotor Activity:  Normal   Concentration:  Good  Recall:  Good  Fund of Knowledge:Good  Language: Good  Akathisia:  Negative  Handed:  Right  AIMS (if indicated):     Assets:  Communication Skills Desire for Improvement Resilience  Sleep:  Number of Hours: 5.75  Cognition: WNL  ADL's:  Intact   Mental Status Per Nursing Assessment::   On Admission:  Suicidal ideation indicated by patient, Suicide plan, Intention to act on suicide plan  Demographic Factors:  62 year old married male , has three adult children, retired   Loss Factors: Concerns about having underlying medical illness that has not yet been identified .  ( patient reports episodes of feeling acutely/ subjectively hot , flushed , dizzy, and which  results in " passing out" - states " next thing I know I am in the ER". He  states carcinoid was suspected and that he underwent extensive workup but there were no findings ). Of note, states he did see a neurologist in the past, and is confident he had an EEG, but is not currently following up with neurologist .   Historical Factors: Prior psychiatric admissions, history of depression, anxiety. States he has never attempted suicide   Risk Reduction Factors:   Sense of responsibility to family, Living with another person, especially a relative, Positive social support and Positive coping skills or problem solving skills  Continued Clinical Symptoms:  Alert, attentive, well related, pleasant, mood described as improved compared to how he felt prior to admission. Denies suicidal ideations, denies homicidal or  violent ideations, no hallucinations, no delusions, not internally preoccupied, future oriented.  Behavior on unit in good control, pleasant on approach. Denies medication side effects. Expresses readiness for discharge today.  Cognitive Features That Contribute To Risk:  No gross cognitive deficits noted upon discharge. Is alert , attentive, and oriented x 3     Suicide Risk:  Mild:   Suicidal ideation of limited frequency, intensity, duration, and specificity.  There are no identifiable plans, no associated intent, mild dysphoria and related symptoms, good self-control (both objective and subjective assessment), few other risk factors, and identifiable protective factors, including available and accessible social support.  Follow-up Information    Your Neurologist Follow up.        Lavone Orn, MD Follow up in 19 day(s).   Specialty:  Internal Medicine Why:  Appointment is 05/09/17 at 11:00am with Dr. Lavone Orn.  Contact information: [address] [place] [address] [phone]           Plan Of Care/Follow-up recommendations:  Activity:  as tolerated  Diet:  regular Tests:  NA Other:  See below  Patient expresses readiness for discharge and is leaving unit in good spirits  Plans to return home Plans to follow up with PCP for ongoing medical management- I have encouraged him to follow up with Neurologist as well.   Jenne Campus, MD 04/27/2017, 8:05 AM

## 2017-05-01 ENCOUNTER — Other Ambulatory Visit (HOSPITAL_COMMUNITY): Payer: Medicare HMO | Attending: Psychiatry | Admitting: Licensed Clinical Social Worker

## 2017-05-01 DIAGNOSIS — R5382 Chronic fatigue, unspecified: Secondary | ICD-10-CM | POA: Insufficient documentation

## 2017-05-01 DIAGNOSIS — F331 Major depressive disorder, recurrent, moderate: Secondary | ICD-10-CM | POA: Diagnosis not present

## 2017-05-01 DIAGNOSIS — E119 Type 2 diabetes mellitus without complications: Secondary | ICD-10-CM | POA: Insufficient documentation

## 2017-05-01 DIAGNOSIS — F410 Panic disorder [episodic paroxysmal anxiety] without agoraphobia: Secondary | ICD-10-CM | POA: Diagnosis present

## 2017-05-01 DIAGNOSIS — J449 Chronic obstructive pulmonary disease, unspecified: Secondary | ICD-10-CM | POA: Diagnosis not present

## 2017-05-01 DIAGNOSIS — E230 Hypopituitarism: Secondary | ICD-10-CM | POA: Diagnosis not present

## 2017-05-01 DIAGNOSIS — E039 Hypothyroidism, unspecified: Secondary | ICD-10-CM | POA: Diagnosis not present

## 2017-05-01 DIAGNOSIS — I2510 Atherosclerotic heart disease of native coronary artery without angina pectoris: Secondary | ICD-10-CM | POA: Diagnosis not present

## 2017-05-01 DIAGNOSIS — Z7984 Long term (current) use of oral hypoglycemic drugs: Secondary | ICD-10-CM | POA: Diagnosis not present

## 2017-05-01 DIAGNOSIS — Z79899 Other long term (current) drug therapy: Secondary | ICD-10-CM | POA: Diagnosis not present

## 2017-05-01 DIAGNOSIS — K7581 Nonalcoholic steatohepatitis (NASH): Secondary | ICD-10-CM | POA: Diagnosis not present

## 2017-05-01 DIAGNOSIS — R69 Illness, unspecified: Secondary | ICD-10-CM | POA: Diagnosis not present
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
## 2017-05-09 DIAGNOSIS — R69 Illness, unspecified: Secondary | ICD-10-CM | POA: Diagnosis not present

## 2017-05-09 DIAGNOSIS — R55 Syncope and collapse: Secondary | ICD-10-CM | POA: Diagnosis not present

## 2017-05-11 DIAGNOSIS — R1030 Lower abdominal pain, unspecified: Secondary | ICD-10-CM | POA: Diagnosis not present

## 2017-05-11 DIAGNOSIS — R351 Nocturia: Secondary | ICD-10-CM | POA: Diagnosis not present

## 2017-05-16 ENCOUNTER — Encounter: Payer: Self-pay | Admitting: Neurology

## 2017-05-16 ENCOUNTER — Ambulatory Visit: Payer: Medicare HMO | Admitting: Neurology

## 2017-05-16 ENCOUNTER — Telehealth: Payer: Self-pay | Admitting: Neurology

## 2017-05-16 VITALS — BP 147/82 | HR 82 | Ht 67.0 in | Wt 198.0 lb

## 2017-05-16 DIAGNOSIS — R569 Unspecified convulsions: Secondary | ICD-10-CM | POA: Diagnosis not present
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
## 2017-05-16 NOTE — Telephone Encounter (Signed)
[employer] medicare order sent to [facility] they do the auth & will reach out the patient to schedule.

## 2017-05-16 NOTE — Patient Instructions (Signed)
   We will check MRI of the brain and get an EEG study.

## 2017-05-19 DIAGNOSIS — R69 Illness, unspecified: Secondary | ICD-10-CM | POA: Diagnosis not present

## 2017-05-25 DIAGNOSIS — R69 Illness, unspecified: Secondary | ICD-10-CM | POA: Diagnosis not present

## 2017-05-31 ENCOUNTER — Encounter: Payer: Self-pay | Admitting: Neurology

## 2017-06-15 ENCOUNTER — Other Ambulatory Visit: Payer: Medicare HMO

## 2017-07-13 ENCOUNTER — Institutional Professional Consult (permissible substitution): Payer: Medicare HMO | Admitting: Neurology

## 2017-08-01 DIAGNOSIS — E119 Type 2 diabetes mellitus without complications: Secondary | ICD-10-CM | POA: Diagnosis not present

## 2017-08-01 DIAGNOSIS — K7581 Nonalcoholic steatohepatitis (NASH): Secondary | ICD-10-CM | POA: Diagnosis not present

## 2017-08-01 DIAGNOSIS — E230 Hypopituitarism: Secondary | ICD-10-CM | POA: Diagnosis not present

## 2017-08-01 DIAGNOSIS — Z79899 Other long term (current) drug therapy: Secondary | ICD-10-CM | POA: Diagnosis not present

## 2017-08-01 DIAGNOSIS — Z125 Encounter for screening for malignant neoplasm of prostate: Secondary | ICD-10-CM | POA: Diagnosis not present

## 2017-08-03 DIAGNOSIS — M47816 Spondylosis without myelopathy or radiculopathy, lumbar region: Secondary | ICD-10-CM | POA: Diagnosis not present

## 2017-08-05 ENCOUNTER — Other Ambulatory Visit: Payer: Self-pay | Admitting: Neurosurgery

## 2017-08-05 DIAGNOSIS — M47816 Spondylosis without myelopathy or radiculopathy, lumbar region: Secondary | ICD-10-CM

## 2017-08-15 ENCOUNTER — Ambulatory Visit
Admission: RE | Admit: 2017-08-15 | Discharge: 2017-08-15 | Disposition: A | Discharge status: Home / Self Care | Payer: Medicare HMO | Source: Ambulatory Visit | Attending: Neurosurgery | Admitting: Neurosurgery

## 2017-08-15 DIAGNOSIS — M47816 Spondylosis without myelopathy or radiculopathy, lumbar region: Secondary | ICD-10-CM

## 2017-08-15 DIAGNOSIS — M48061 Spinal stenosis, lumbar region without neurogenic claudication: Secondary | ICD-10-CM | POA: Diagnosis not present

## 2017-09-08 DIAGNOSIS — M47816 Spondylosis without myelopathy or radiculopathy, lumbar region: Secondary | ICD-10-CM | POA: Diagnosis not present

## 2017-09-11 ENCOUNTER — Other Ambulatory Visit: Payer: Self-pay | Admitting: Neurosurgery

## 2017-09-11 DIAGNOSIS — M47816 Spondylosis without myelopathy or radiculopathy, lumbar region: Secondary | ICD-10-CM

## 2017-09-19 ENCOUNTER — Ambulatory Visit: Payer: Medicare HMO | Admitting: Neurology

## 2017-09-25 ENCOUNTER — Ambulatory Visit
Admission: RE | Admit: 2017-09-25 | Discharge: 2017-09-25 | Disposition: A | Discharge status: Home / Self Care | Payer: Medicare HMO | Source: Ambulatory Visit | Attending: Neurosurgery | Admitting: Neurosurgery

## 2017-09-25 DIAGNOSIS — M5126 Other intervertebral disc displacement, lumbar region: Secondary | ICD-10-CM | POA: Diagnosis not present

## 2017-09-25 DIAGNOSIS — M47816 Spondylosis without myelopathy or radiculopathy, lumbar region: Secondary | ICD-10-CM

## 2017-09-25 MED ORDER — METHYLPREDNISOLONE ACETATE 40 MG/ML INJ SUSP (RADIOLOG
120.0000 mg | Freq: Once | INTRAMUSCULAR | Status: AC
  Administered 2017-09-25: 120 mg via EPIDURAL

## 2017-09-25 MED ORDER — IOPAMIDOL (ISOVUE-M 200) INJECTION 41%
1.0000 mL | Freq: Once | INTRAMUSCULAR | Status: AC
  Administered 2017-09-25: 1 mL via EPIDURAL

## 2017-09-25 NOTE — Discharge Instructions (Signed)

## 2017-10-24 DIAGNOSIS — M47816 Spondylosis without myelopathy or radiculopathy, lumbar region: Secondary | ICD-10-CM | POA: Diagnosis not present

## 2017-10-25 DIAGNOSIS — R69 Illness, unspecified: Secondary | ICD-10-CM | POA: Diagnosis not present

## 2017-11-02 DIAGNOSIS — R69 Illness, unspecified: Secondary | ICD-10-CM | POA: Diagnosis not present

## 2018-01-24 DIAGNOSIS — M47816 Spondylosis without myelopathy or radiculopathy, lumbar region: Secondary | ICD-10-CM | POA: Diagnosis not present

## 2018-01-31 DIAGNOSIS — E119 Type 2 diabetes mellitus without complications: Secondary | ICD-10-CM | POA: Diagnosis not present

## 2018-01-31 DIAGNOSIS — K7581 Nonalcoholic steatohepatitis (NASH): Secondary | ICD-10-CM | POA: Diagnosis not present

## 2018-01-31 DIAGNOSIS — E230 Hypopituitarism: Secondary | ICD-10-CM | POA: Diagnosis not present

## 2018-02-02 DIAGNOSIS — E119 Type 2 diabetes mellitus without complications: Secondary | ICD-10-CM | POA: Diagnosis not present

## 2018-05-07 DIAGNOSIS — M47812 Spondylosis without myelopathy or radiculopathy, cervical region: Secondary | ICD-10-CM | POA: Diagnosis not present

## 2018-05-07 DIAGNOSIS — M47816 Spondylosis without myelopathy or radiculopathy, lumbar region: Secondary | ICD-10-CM | POA: Diagnosis not present

## 2018-06-07 DIAGNOSIS — E559 Vitamin D deficiency, unspecified: Secondary | ICD-10-CM | POA: Diagnosis not present

## 2018-06-07 DIAGNOSIS — M47812 Spondylosis without myelopathy or radiculopathy, cervical region: Secondary | ICD-10-CM | POA: Diagnosis not present

## 2018-06-07 DIAGNOSIS — M4306 Spondylolysis, lumbar region: Secondary | ICD-10-CM | POA: Diagnosis not present

## 2018-06-07 DIAGNOSIS — M129 Arthropathy, unspecified: Secondary | ICD-10-CM | POA: Diagnosis not present

## 2018-06-07 DIAGNOSIS — G894 Chronic pain syndrome: Secondary | ICD-10-CM | POA: Diagnosis not present

## 2018-06-07 DIAGNOSIS — Z79899 Other long term (current) drug therapy: Secondary | ICD-10-CM | POA: Diagnosis not present

## 2018-06-18 MED FILL — OXYCODONE-APAP 10-325: 10-325 | 30 days supply | Qty: 180 | Fill #0

## 2018-06-18 MED FILL — NARCAN 4 MG NASAL SPRAY: 4 | 2 days supply | Qty: 2 | Fill #0

## 2018-07-06 DIAGNOSIS — M4306 Spondylolysis, lumbar region: Secondary | ICD-10-CM | POA: Diagnosis not present

## 2018-07-06 DIAGNOSIS — E559 Vitamin D deficiency, unspecified: Secondary | ICD-10-CM | POA: Diagnosis not present

## 2018-07-06 DIAGNOSIS — G894 Chronic pain syndrome: Secondary | ICD-10-CM | POA: Diagnosis not present

## 2018-07-06 DIAGNOSIS — R899 Unspecified abnormal finding in specimens from other organs, systems and tissues: Secondary | ICD-10-CM | POA: Diagnosis not present

## 2018-07-06 DIAGNOSIS — Z79899 Other long term (current) drug therapy: Secondary | ICD-10-CM | POA: Diagnosis not present

## 2018-07-11 DIAGNOSIS — E663 Overweight: Secondary | ICD-10-CM | POA: Diagnosis not present

## 2018-07-11 DIAGNOSIS — Z6829 Body mass index (BMI) 29.0-29.9, adult: Secondary | ICD-10-CM | POA: Diagnosis not present

## 2018-07-11 DIAGNOSIS — R768 Other specified abnormal immunological findings in serum: Secondary | ICD-10-CM | POA: Diagnosis not present

## 2018-07-20 MED FILL — OXYCODONE-APAP 10-325: 10-325 | 30 days supply | Qty: 180 | Fill #0

## 2018-08-01 DIAGNOSIS — G8929 Other chronic pain: Secondary | ICD-10-CM | POA: Diagnosis not present

## 2018-08-01 DIAGNOSIS — M5441 Lumbago with sciatica, right side: Secondary | ICD-10-CM | POA: Diagnosis not present

## 2018-08-01 DIAGNOSIS — E559 Vitamin D deficiency, unspecified: Secondary | ICD-10-CM | POA: Diagnosis not present

## 2018-08-01 DIAGNOSIS — E782 Mixed hyperlipidemia: Secondary | ICD-10-CM | POA: Diagnosis not present

## 2018-08-01 DIAGNOSIS — R5382 Chronic fatigue, unspecified: Secondary | ICD-10-CM | POA: Diagnosis not present

## 2018-08-01 DIAGNOSIS — M25551 Pain in right hip: Secondary | ICD-10-CM | POA: Diagnosis not present

## 2018-08-01 DIAGNOSIS — E119 Type 2 diabetes mellitus without complications: Secondary | ICD-10-CM | POA: Diagnosis not present

## 2018-08-13 MED FILL — VIT D2 1.25 MG (50,000 UNIT: 1.25 MG | 28 days supply | Qty: 4 | Fill #0

## 2018-08-14 DIAGNOSIS — E119 Type 2 diabetes mellitus without complications: Secondary | ICD-10-CM | POA: Diagnosis not present

## 2018-08-15 MED FILL — OXYCODONE-APAP 10-325: 10-325 | 30 days supply | Qty: 180 | Fill #0

## 2018-08-17 DIAGNOSIS — G894 Chronic pain syndrome: Secondary | ICD-10-CM | POA: Diagnosis not present

## 2018-08-17 DIAGNOSIS — Z79899 Other long term (current) drug therapy: Secondary | ICD-10-CM | POA: Diagnosis not present

## 2018-08-17 DIAGNOSIS — M4306 Spondylolysis, lumbar region: Secondary | ICD-10-CM | POA: Diagnosis not present

## 2018-08-20 DIAGNOSIS — M4306 Spondylolysis, lumbar region: Secondary | ICD-10-CM | POA: Diagnosis not present

## 2018-08-20 DIAGNOSIS — M47812 Spondylosis without myelopathy or radiculopathy, cervical region: Secondary | ICD-10-CM | POA: Diagnosis not present

## 2018-08-20 DIAGNOSIS — G8929 Other chronic pain: Secondary | ICD-10-CM | POA: Diagnosis not present

## 2018-08-22 ENCOUNTER — Other Ambulatory Visit (HOSPITAL_COMMUNITY): Payer: Self-pay | Admitting: Internal Medicine

## 2018-08-22 DIAGNOSIS — I2584 Coronary atherosclerosis due to calcified coronary lesion: Secondary | ICD-10-CM

## 2018-08-22 MED FILL — PRAVASTATIN SODIUM 20 MG TA: 20 | 90 days supply | Qty: 90 | Fill #0

## 2018-08-27 ENCOUNTER — Telehealth (HOSPITAL_COMMUNITY): Payer: Self-pay

## 2018-08-27 NOTE — Telephone Encounter (Signed)
Instructions left on the patient's answering machine. Asked to call back with any questions. S.Stephen Herrera EMTP 

## 2018-08-29 DIAGNOSIS — E119 Type 2 diabetes mellitus without complications: Secondary | ICD-10-CM | POA: Diagnosis not present

## 2018-08-30 ENCOUNTER — Ambulatory Visit (HOSPITAL_COMMUNITY): Payer: Medicare HMO

## 2018-08-30 ENCOUNTER — Telehealth (HOSPITAL_COMMUNITY): Payer: Self-pay

## 2018-08-30 ENCOUNTER — Other Ambulatory Visit: Payer: Self-pay

## 2018-09-05 ENCOUNTER — Telehealth (HOSPITAL_COMMUNITY): Payer: Self-pay | Admitting: *Deleted

## 2018-09-05 NOTE — Telephone Encounter (Signed)
Patient given detailed instructions per Myocardial Perfusion Study Information Sheet for the test on 09/10/18 at 8:00. Patient notified to arrive 15 minutes early and that it is imperative to arrive on time for appointment to keep from having the test rescheduled.  If you need to cancel or reschedule your appointment, please call the office within 24 hours of your appointment. . Patient verbalized understanding.Stephen Herrera

## 2018-09-10 ENCOUNTER — Other Ambulatory Visit: Payer: Self-pay

## 2018-09-10 ENCOUNTER — Ambulatory Visit (HOSPITAL_COMMUNITY): Payer: Medicare HMO | Attending: Cardiovascular Disease

## 2018-09-10 VITALS — Ht 67.0 in | Wt 185.0 lb

## 2018-09-10 DIAGNOSIS — I2584 Coronary atherosclerosis due to calcified coronary lesion: Secondary | ICD-10-CM | POA: Diagnosis not present

## 2018-09-10 DIAGNOSIS — I2510 Atherosclerotic heart disease of native coronary artery without angina pectoris: Secondary | ICD-10-CM | POA: Diagnosis present

## 2018-09-10 LAB — MYOCARDIAL PERFUSION IMAGING
LV dias vol: 89 mL (ref 62–150)
LV sys vol: 34 mL
Peak HR: 94 {beats}/min
Rest HR: 83 {beats}/min
SDS: 0
SRS: 0
SSS: 0
TID: 1.29

## 2018-09-10 MED ORDER — TECHNETIUM TC 99M TETROFOSMIN IV KIT
10.0000 | PACK | Freq: Once | INTRAVENOUS | Status: AC | PRN
  Administered 2018-09-10: 10 via INTRAVENOUS
  Filled 2018-09-10: qty 10

## 2018-09-10 MED ORDER — TECHNETIUM TC 99M TETROFOSMIN IV KIT
31.9000 | PACK | Freq: Once | INTRAVENOUS | Status: AC | PRN
  Administered 2018-09-10: 31.9 via INTRAVENOUS
  Filled 2018-09-10: qty 32

## 2018-09-10 MED ORDER — REGADENOSON 0.4 MG/5ML IV SOLN
0.4000 mg | Freq: Once | INTRAVENOUS | Status: AC
  Administered 2018-09-10: 0.4 mg via INTRAVENOUS

## 2018-09-14 DIAGNOSIS — E119 Type 2 diabetes mellitus without complications: Secondary | ICD-10-CM | POA: Diagnosis not present

## 2018-09-17 DIAGNOSIS — I10 Essential (primary) hypertension: Secondary | ICD-10-CM | POA: Diagnosis not present

## 2018-09-17 DIAGNOSIS — E782 Mixed hyperlipidemia: Secondary | ICD-10-CM | POA: Diagnosis not present

## 2018-09-17 DIAGNOSIS — I2584 Coronary atherosclerosis due to calcified coronary lesion: Secondary | ICD-10-CM | POA: Diagnosis not present

## 2018-09-17 DIAGNOSIS — I2510 Atherosclerotic heart disease of native coronary artery without angina pectoris: Secondary | ICD-10-CM | POA: Diagnosis not present

## 2018-09-17 DIAGNOSIS — R69 Illness, unspecified: Secondary | ICD-10-CM | POA: Diagnosis not present

## 2018-09-17 DIAGNOSIS — E119 Type 2 diabetes mellitus without complications: Secondary | ICD-10-CM | POA: Diagnosis not present

## 2018-09-18 DIAGNOSIS — E119 Type 2 diabetes mellitus without complications: Secondary | ICD-10-CM | POA: Diagnosis not present

## 2018-09-20 DIAGNOSIS — M47812 Spondylosis without myelopathy or radiculopathy, cervical region: Secondary | ICD-10-CM | POA: Diagnosis not present

## 2018-09-20 DIAGNOSIS — G8929 Other chronic pain: Secondary | ICD-10-CM | POA: Diagnosis not present

## 2018-09-20 DIAGNOSIS — M4306 Spondylolysis, lumbar region: Secondary | ICD-10-CM | POA: Diagnosis not present

## 2018-10-17 DIAGNOSIS — G894 Chronic pain syndrome: Secondary | ICD-10-CM | POA: Diagnosis not present

## 2018-10-17 DIAGNOSIS — Z79899 Other long term (current) drug therapy: Secondary | ICD-10-CM | POA: Diagnosis not present

## 2018-10-17 DIAGNOSIS — M4306 Spondylolysis, lumbar region: Secondary | ICD-10-CM | POA: Diagnosis not present

## 2018-10-23 DIAGNOSIS — M4306 Spondylolysis, lumbar region: Secondary | ICD-10-CM | POA: Diagnosis not present

## 2018-10-23 DIAGNOSIS — G8929 Other chronic pain: Secondary | ICD-10-CM | POA: Diagnosis not present

## 2018-10-23 DIAGNOSIS — M47812 Spondylosis without myelopathy or radiculopathy, cervical region: Secondary | ICD-10-CM | POA: Diagnosis not present

## 2018-11-22 DIAGNOSIS — G8929 Other chronic pain: Secondary | ICD-10-CM | POA: Diagnosis not present

## 2018-11-22 DIAGNOSIS — M47812 Spondylosis without myelopathy or radiculopathy, cervical region: Secondary | ICD-10-CM | POA: Diagnosis not present

## 2018-11-22 DIAGNOSIS — M4306 Spondylolysis, lumbar region: Secondary | ICD-10-CM | POA: Diagnosis not present

## 2018-12-03 DIAGNOSIS — E119 Type 2 diabetes mellitus without complications: Secondary | ICD-10-CM | POA: Diagnosis not present

## 2018-12-03 DIAGNOSIS — I2510 Atherosclerotic heart disease of native coronary artery without angina pectoris: Secondary | ICD-10-CM | POA: Diagnosis not present

## 2018-12-03 DIAGNOSIS — E559 Vitamin D deficiency, unspecified: Secondary | ICD-10-CM | POA: Diagnosis not present

## 2018-12-03 DIAGNOSIS — Z1159 Encounter for screening for other viral diseases: Secondary | ICD-10-CM | POA: Diagnosis not present

## 2018-12-03 DIAGNOSIS — Z0000 Encounter for general adult medical examination without abnormal findings: Secondary | ICD-10-CM | POA: Diagnosis not present

## 2018-12-03 DIAGNOSIS — I700 Atherosclerosis of aorta: Secondary | ICD-10-CM | POA: Diagnosis not present

## 2018-12-03 DIAGNOSIS — Z1389 Encounter for screening for other disorder: Secondary | ICD-10-CM | POA: Diagnosis not present

## 2018-12-03 DIAGNOSIS — R69 Illness, unspecified: Secondary | ICD-10-CM | POA: Diagnosis not present

## 2018-12-03 DIAGNOSIS — K7581 Nonalcoholic steatohepatitis (NASH): Secondary | ICD-10-CM | POA: Diagnosis not present

## 2018-12-03 DIAGNOSIS — E782 Mixed hyperlipidemia: Secondary | ICD-10-CM | POA: Diagnosis not present

## 2018-12-06 ENCOUNTER — Other Ambulatory Visit: Payer: Self-pay | Admitting: Internal Medicine

## 2018-12-06 DIAGNOSIS — F17209 Nicotine dependence, unspecified, with unspecified nicotine-induced disorders: Secondary | ICD-10-CM

## 2018-12-13 ENCOUNTER — Ambulatory Visit
Admission: RE | Admit: 2018-12-13 | Discharge: 2018-12-13 | Disposition: A | Discharge status: Home / Self Care | Payer: Medicare HMO | Source: Ambulatory Visit | Attending: Internal Medicine | Admitting: Internal Medicine

## 2018-12-13 DIAGNOSIS — Z87891 Personal history of nicotine dependence: Secondary | ICD-10-CM | POA: Diagnosis not present

## 2018-12-13 DIAGNOSIS — F17209 Nicotine dependence, unspecified, with unspecified nicotine-induced disorders: Secondary | ICD-10-CM

## 2018-12-14 DIAGNOSIS — Z79899 Other long term (current) drug therapy: Secondary | ICD-10-CM | POA: Diagnosis not present

## 2018-12-14 DIAGNOSIS — M4306 Spondylolysis, lumbar region: Secondary | ICD-10-CM | POA: Diagnosis not present

## 2018-12-14 DIAGNOSIS — G894 Chronic pain syndrome: Secondary | ICD-10-CM | POA: Diagnosis not present

## 2018-12-20 ENCOUNTER — Other Ambulatory Visit: Payer: Self-pay | Admitting: Internal Medicine

## 2018-12-20 DIAGNOSIS — R911 Solitary pulmonary nodule: Secondary | ICD-10-CM

## 2018-12-21 DIAGNOSIS — E119 Type 2 diabetes mellitus without complications: Secondary | ICD-10-CM | POA: Diagnosis not present

## 2018-12-21 DIAGNOSIS — E782 Mixed hyperlipidemia: Secondary | ICD-10-CM | POA: Diagnosis not present

## 2018-12-21 DIAGNOSIS — I10 Essential (primary) hypertension: Secondary | ICD-10-CM | POA: Diagnosis not present

## 2018-12-21 DIAGNOSIS — I2510 Atherosclerotic heart disease of native coronary artery without angina pectoris: Secondary | ICD-10-CM | POA: Diagnosis not present

## 2018-12-23 DIAGNOSIS — G8929 Other chronic pain: Secondary | ICD-10-CM | POA: Diagnosis not present

## 2018-12-23 DIAGNOSIS — M4306 Spondylolysis, lumbar region: Secondary | ICD-10-CM | POA: Diagnosis not present

## 2018-12-23 DIAGNOSIS — M47812 Spondylosis without myelopathy or radiculopathy, cervical region: Secondary | ICD-10-CM | POA: Diagnosis not present

## 2019-01-22 DIAGNOSIS — M47812 Spondylosis without myelopathy or radiculopathy, cervical region: Secondary | ICD-10-CM | POA: Diagnosis not present

## 2019-01-22 DIAGNOSIS — M4306 Spondylolysis, lumbar region: Secondary | ICD-10-CM | POA: Diagnosis not present

## 2019-01-22 DIAGNOSIS — G8929 Other chronic pain: Secondary | ICD-10-CM | POA: Diagnosis not present

## 2019-01-24 ENCOUNTER — Inpatient Hospital Stay: Admission: RE | Admit: 2019-01-24 | Payer: Medicare HMO | Source: Ambulatory Visit

## 2019-01-24 DIAGNOSIS — H524 Presbyopia: Secondary | ICD-10-CM | POA: Diagnosis not present

## 2019-01-24 DIAGNOSIS — E119 Type 2 diabetes mellitus without complications: Secondary | ICD-10-CM | POA: Diagnosis not present
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
## 2019-01-25 ENCOUNTER — Encounter: Payer: Self-pay | Admitting: Internal Medicine

## 2019-01-25 ENCOUNTER — Ambulatory Visit (INDEPENDENT_AMBULATORY_CARE_PROVIDER_SITE_OTHER): Payer: Medicare HMO | Admitting: Internal Medicine

## 2019-01-25 ENCOUNTER — Other Ambulatory Visit: Payer: Self-pay

## 2019-01-25 DIAGNOSIS — R918 Other nonspecific abnormal finding of lung field: Secondary | ICD-10-CM

## 2019-01-25 NOTE — Patient Instructions (Addendum)
We will repeat your CT scan Mid January.   We have scheduled your PFT for January 28th at 4:00pm.   Your covid test has been scheduled for Monday January 25th at 9:00am at [address].   We will see you back in 6 months.

## 2019-01-28 DIAGNOSIS — Z0100 Encounter for examination of eyes and vision without abnormal findings: Secondary | ICD-10-CM | POA: Diagnosis not present

## 2019-01-28 DIAGNOSIS — E782 Mixed hyperlipidemia: Secondary | ICD-10-CM | POA: Diagnosis not present

## 2019-02-05 DIAGNOSIS — G8929 Other chronic pain: Secondary | ICD-10-CM | POA: Diagnosis not present

## 2019-02-05 DIAGNOSIS — M47812 Spondylosis without myelopathy or radiculopathy, cervical region: Secondary | ICD-10-CM | POA: Diagnosis not present

## 2019-02-05 DIAGNOSIS — M4306 Spondylolysis, lumbar region: Secondary | ICD-10-CM | POA: Diagnosis not present

## 2019-02-11 DIAGNOSIS — R69 Illness, unspecified: Secondary | ICD-10-CM | POA: Diagnosis not present

## 2019-02-13 DIAGNOSIS — Z79899 Other long term (current) drug therapy: Secondary | ICD-10-CM | POA: Diagnosis not present

## 2019-02-13 DIAGNOSIS — M4306 Spondylolysis, lumbar region: Secondary | ICD-10-CM | POA: Diagnosis not present

## 2019-02-13 DIAGNOSIS — G894 Chronic pain syndrome: Secondary | ICD-10-CM | POA: Diagnosis not present

## 2019-02-21 ENCOUNTER — Telehealth: Payer: Self-pay | Admitting: Pulmonary Disease

## 2019-02-21 NOTE — Telephone Encounter (Signed)
02/21/2019 1000  Pt LDCT scan was denied by his insurance due to being scheduled too soon (earlier than 3 months from 12/13/2018 LDCT).   Denial date: 02/13/2019 Member Number: [facility]  Reference Number: [id number]  I contact the Peer to Peer number: [phone]. I spoke with rep. [name] I was informed the CT was denied due to being scheduled "too soon from 12/13/2018 LDCT scan". The LDCT scan nodule follow was scheduled for 03/07/2019. So earlier than the 3 month window stated by radiology on the 12/13/2018 scan. I was then informed to clall the [employer] appeals line: [phone]. I contacted the rep [name] and an appeal was started for the patient.   [name] stated we need to fax documentation to:  Fax number: [name] hour turn around   I explained to [name] that pt has a suspicious nodule on 12/13/2018. Pt needs follow up CT imaging in 3 months to monitor size and growth. Continuing to delay this CT imaging is a direct delay of care and can directly affect the patients overall health and mortality.   To check status:  Telephone number: [phone]  Will route to Dr. Tamala Julian and [facility] pool as Juluis Rainier. Last OV note and this documentation printed and handed to [facility] for faxing and follow up.   A total of 34 min of time was spent on this appeal.   [name] FNP

## 2019-02-21 NOTE — Telephone Encounter (Signed)
Will do.  We have submitted the appropriate documentation: your last office note as well as the note attached.  [place] [facility] has the documentation.  Will await information from the insurance company.   [name] FNP

## 2019-02-21 NOTE — Telephone Encounter (Signed)
Please let me know if they still refuse.  He's going to be furious.

## 2019-02-22 DIAGNOSIS — M4306 Spondylolysis, lumbar region: Secondary | ICD-10-CM | POA: Diagnosis not present

## 2019-02-22 DIAGNOSIS — M47812 Spondylosis without myelopathy or radiculopathy, cervical region: Secondary | ICD-10-CM | POA: Diagnosis not present

## 2019-02-22 DIAGNOSIS — G8929 Other chronic pain: Secondary | ICD-10-CM | POA: Diagnosis not present

## 2019-02-25 NOTE — Telephone Encounter (Signed)
02/25/2019 1513  We have received a letter dated on 02/23/2019.  This is from [employer].  It states that they have approved the appeal.  This is regarding appeal case number: [id number].  We will provide a copy for the patient care coordinators.  And copy of this note is being sent to Dr. Tamala Julian as well as the [facility] as FYI.  An additional copy has been signed and will be scanned into the patient's chart.  [name], FNP

## 2019-02-25 NOTE — Telephone Encounter (Signed)
I received paperwork also for approval and pt has been notified Joellen Jersey

## 2019-03-07 ENCOUNTER — Other Ambulatory Visit: Payer: Self-pay

## 2019-03-07 ENCOUNTER — Ambulatory Visit (INDEPENDENT_AMBULATORY_CARE_PROVIDER_SITE_OTHER)
Admission: RE | Admit: 2019-03-07 | Discharge: 2019-03-07 | Disposition: A | Discharge status: Home / Self Care | Payer: Medicare HMO | Source: Ambulatory Visit | Attending: Internal Medicine | Admitting: Internal Medicine

## 2019-03-07 DIAGNOSIS — Z87891 Personal history of nicotine dependence: Secondary | ICD-10-CM | POA: Diagnosis not present

## 2019-03-07 DIAGNOSIS — R918 Other nonspecific abnormal finding of lung field: Secondary | ICD-10-CM | POA: Diagnosis not present

## 2019-03-08 ENCOUNTER — Telehealth: Payer: Self-pay | Admitting: *Deleted

## 2019-03-08 DIAGNOSIS — G8929 Other chronic pain: Secondary | ICD-10-CM | POA: Diagnosis not present

## 2019-03-08 DIAGNOSIS — R918 Other nonspecific abnormal finding of lung field: Secondary | ICD-10-CM

## 2019-03-08 DIAGNOSIS — M4306 Spondylolysis, lumbar region: Secondary | ICD-10-CM | POA: Diagnosis not present

## 2019-03-08 DIAGNOSIS — M47812 Spondylosis without myelopathy or radiculopathy, cervical region: Secondary | ICD-10-CM | POA: Diagnosis not present

## 2019-03-08 NOTE — Telephone Encounter (Signed)
-----   Message from [name], MD sent at 03/08/2019  3:45 PM EST ----- Regarding: Follow up Hi let's cancel this guys PFTs and schedule a CT chest lung nodule followup without contrast for May 2021.  Televisit thereafter.  You can cancel his upcoming appointment with me.  Thank you Stephen Herrera

## 2019-03-08 NOTE — Telephone Encounter (Signed)
PFT and COVID testing has been canceled per Dr. Tamala Julian. Order has been placed for CT chest.  Spoke with pt. States that he had already spoken with Dr. Tamala Julian about these changes. Nothing further was needed.

## 2019-03-08 NOTE — Progress Notes (Signed)
Called patient about his CT results. Will do another around May 2021 to assure stability.  No need for PFTs at this time.  Erskine Emery MD PCCM

## 2019-03-18 ENCOUNTER — Other Ambulatory Visit (HOSPITAL_COMMUNITY): Payer: Medicare HMO

## 2019-03-19 ENCOUNTER — Ambulatory Visit: Payer: Medicare HMO

## 2019-03-29 DIAGNOSIS — I10 Essential (primary) hypertension: Secondary | ICD-10-CM | POA: Diagnosis not present

## 2019-03-29 DIAGNOSIS — E782 Mixed hyperlipidemia: Secondary | ICD-10-CM | POA: Diagnosis not present

## 2019-03-29 DIAGNOSIS — I2510 Atherosclerotic heart disease of native coronary artery without angina pectoris: Secondary | ICD-10-CM | POA: Diagnosis not present

## 2019-03-29 DIAGNOSIS — E119 Type 2 diabetes mellitus without complications: Secondary | ICD-10-CM | POA: Diagnosis not present

## 2019-04-11 DIAGNOSIS — G894 Chronic pain syndrome: Secondary | ICD-10-CM | POA: Diagnosis not present

## 2019-04-11 DIAGNOSIS — M4306 Spondylolysis, lumbar region: Secondary | ICD-10-CM | POA: Diagnosis not present

## 2019-04-11 DIAGNOSIS — Z79899 Other long term (current) drug therapy: Secondary | ICD-10-CM | POA: Diagnosis not present

## 2019-05-13 ENCOUNTER — Telehealth: Payer: Self-pay | Admitting: Internal Medicine

## 2019-05-13 NOTE — Telephone Encounter (Signed)
This will be scheduled in April for May. It's too soon to schedule at this time.

## 2019-05-13 NOTE — Telephone Encounter (Signed)
Called and spoke with Patient.  Patient stated Dr. Tamala Julian had requested a follow up CT chest in 3 months,from January CT. Patient stated he needed CT scheduled in April.  CT chest is ordered by Dr. Tamala Julian, for 5/21, with tele visit afterwards.  Message routed to [facility]

## 2019-05-13 NOTE — Telephone Encounter (Signed)
Called and spoke with pt letting him know the info stated by [place], [facility].   After stated that to pt, pt stated that he travels abroad and will be travelling in May 2020. Due to this, pt is needing to have the CT performed in April 2021.

## 2019-05-14 NOTE — Telephone Encounter (Signed)
I will work on getting this scheduled in April for the patient.

## 2019-05-16 NOTE — Telephone Encounter (Signed)
Pt's CT is scheduled for 4/26 @ 10 @ [facility].  Pt is aware of appointment.  Nothing further needed at this time.

## 2019-05-29 DIAGNOSIS — R69 Illness, unspecified: Secondary | ICD-10-CM | POA: Diagnosis not present

## 2019-06-04 DIAGNOSIS — E782 Mixed hyperlipidemia: Secondary | ICD-10-CM | POA: Diagnosis not present

## 2019-06-04 DIAGNOSIS — R911 Solitary pulmonary nodule: Secondary | ICD-10-CM | POA: Diagnosis not present

## 2019-06-04 DIAGNOSIS — I2510 Atherosclerotic heart disease of native coronary artery without angina pectoris: Secondary | ICD-10-CM | POA: Diagnosis not present

## 2019-06-04 DIAGNOSIS — R69 Illness, unspecified: Secondary | ICD-10-CM | POA: Diagnosis not present

## 2019-06-04 DIAGNOSIS — I700 Atherosclerosis of aorta: Secondary | ICD-10-CM | POA: Diagnosis not present

## 2019-06-04 DIAGNOSIS — E119 Type 2 diabetes mellitus without complications: Secondary | ICD-10-CM | POA: Diagnosis not present

## 2019-06-13 DIAGNOSIS — R69 Illness, unspecified: Secondary | ICD-10-CM | POA: Diagnosis not present

## 2019-06-17 ENCOUNTER — Ambulatory Visit (INDEPENDENT_AMBULATORY_CARE_PROVIDER_SITE_OTHER)
Admission: RE | Admit: 2019-06-17 | Discharge: 2019-06-17 | Disposition: A | Discharge status: Home / Self Care | Payer: Medicare HMO | Source: Ambulatory Visit | Attending: Internal Medicine | Admitting: Internal Medicine

## 2019-06-17 ENCOUNTER — Other Ambulatory Visit: Payer: Self-pay

## 2019-06-17 ENCOUNTER — Telehealth: Payer: Self-pay | Admitting: Internal Medicine

## 2019-06-17 DIAGNOSIS — Z87891 Personal history of nicotine dependence: Secondary | ICD-10-CM

## 2019-06-17 DIAGNOSIS — R918 Other nonspecific abnormal finding of lung field: Secondary | ICD-10-CM

## 2019-06-17 DIAGNOSIS — J439 Emphysema, unspecified: Secondary | ICD-10-CM | POA: Diagnosis not present

## 2019-06-17 DIAGNOSIS — R911 Solitary pulmonary nodule: Secondary | ICD-10-CM | POA: Diagnosis not present

## 2019-06-17 NOTE — Telephone Encounter (Signed)
CT results reviewed with patient, will ask if our LDCT specialist can take over further monitoring (next due April 2022).  Appreciate help.  Erskine Emery MD PCCM

## 2019-06-20 DIAGNOSIS — R69 Illness, unspecified: Secondary | ICD-10-CM | POA: Diagnosis not present

## 2019-06-20 NOTE — Telephone Encounter (Signed)
We will add the patient to the screening program to be seen starting 05/2020. Thanks so much.  Langley Gauss, please add this patient for 05/2020 to be seen by the screening program. Thanks so much.

## 2019-06-24 DIAGNOSIS — Z79899 Other long term (current) drug therapy: Secondary | ICD-10-CM | POA: Diagnosis not present

## 2019-06-24 DIAGNOSIS — G894 Chronic pain syndrome: Secondary | ICD-10-CM | POA: Diagnosis not present

## 2019-06-24 DIAGNOSIS — M4306 Spondylolysis, lumbar region: Secondary | ICD-10-CM | POA: Diagnosis not present

## 2019-08-23 DIAGNOSIS — G894 Chronic pain syndrome: Secondary | ICD-10-CM | POA: Diagnosis not present

## 2019-08-23 DIAGNOSIS — Z79899 Other long term (current) drug therapy: Secondary | ICD-10-CM | POA: Diagnosis not present

## 2019-08-23 DIAGNOSIS — M4306 Spondylolysis, lumbar region: Secondary | ICD-10-CM | POA: Diagnosis not present

## 2019-09-13 DIAGNOSIS — I10 Essential (primary) hypertension: Secondary | ICD-10-CM | POA: Diagnosis not present

## 2019-09-13 DIAGNOSIS — I2510 Atherosclerotic heart disease of native coronary artery without angina pectoris: Secondary | ICD-10-CM | POA: Diagnosis not present

## 2019-09-13 DIAGNOSIS — E119 Type 2 diabetes mellitus without complications: Secondary | ICD-10-CM | POA: Diagnosis not present

## 2019-09-13 DIAGNOSIS — E782 Mixed hyperlipidemia: Secondary | ICD-10-CM | POA: Diagnosis not present

## 2020-02-18 DIAGNOSIS — I2510 Atherosclerotic heart disease of native coronary artery without angina pectoris: Secondary | ICD-10-CM | POA: Diagnosis not present

## 2020-02-18 DIAGNOSIS — E119 Type 2 diabetes mellitus without complications: Secondary | ICD-10-CM | POA: Diagnosis not present

## 2020-02-18 DIAGNOSIS — I10 Essential (primary) hypertension: Secondary | ICD-10-CM | POA: Diagnosis not present

## 2020-02-18 DIAGNOSIS — G8929 Other chronic pain: Secondary | ICD-10-CM | POA: Diagnosis not present

## 2020-02-18 DIAGNOSIS — E782 Mixed hyperlipidemia: Secondary | ICD-10-CM | POA: Diagnosis not present

## 2020-04-24 DIAGNOSIS — G894 Chronic pain syndrome: Secondary | ICD-10-CM | POA: Diagnosis not present

## 2020-04-24 DIAGNOSIS — M4306 Spondylolysis, lumbar region: Secondary | ICD-10-CM | POA: Diagnosis not present

## 2020-04-24 DIAGNOSIS — Z79899 Other long term (current) drug therapy: Secondary | ICD-10-CM | POA: Diagnosis not present

## 2020-05-22 DIAGNOSIS — G894 Chronic pain syndrome: Secondary | ICD-10-CM | POA: Diagnosis not present

## 2020-05-22 DIAGNOSIS — Z79899 Other long term (current) drug therapy: Secondary | ICD-10-CM | POA: Diagnosis not present

## 2020-05-22 DIAGNOSIS — M4306 Spondylolysis, lumbar region: Secondary | ICD-10-CM | POA: Diagnosis not present

## 2020-05-22 DIAGNOSIS — E559 Vitamin D deficiency, unspecified: Secondary | ICD-10-CM | POA: Diagnosis not present

## 2020-07-23 DIAGNOSIS — Z6829 Body mass index (BMI) 29.0-29.9, adult: Secondary | ICD-10-CM | POA: Diagnosis not present

## 2020-07-23 DIAGNOSIS — M4306 Spondylolysis, lumbar region: Secondary | ICD-10-CM | POA: Diagnosis not present

## 2020-07-23 DIAGNOSIS — Z79899 Other long term (current) drug therapy: Secondary | ICD-10-CM | POA: Diagnosis not present

## 2020-07-23 DIAGNOSIS — J019 Acute sinusitis, unspecified: Secondary | ICD-10-CM | POA: Diagnosis not present

## 2020-07-23 DIAGNOSIS — G894 Chronic pain syndrome: Secondary | ICD-10-CM | POA: Diagnosis not present

## 2020-07-23 DIAGNOSIS — R030 Elevated blood-pressure reading, without diagnosis of hypertension: Secondary | ICD-10-CM | POA: Diagnosis not present

## 2020-07-27 ENCOUNTER — Encounter: Payer: Self-pay | Admitting: Hematology

## 2020-07-27 ENCOUNTER — Emergency Department (HOSPITAL_COMMUNITY)
Admission: EM | Admit: 2020-07-27 | Discharge: 2020-07-27 | Disposition: A | Discharge status: Home / Self Care | Payer: Medicare HMO | Attending: Emergency Medicine | Admitting: Emergency Medicine

## 2020-07-27 ENCOUNTER — Emergency Department (HOSPITAL_COMMUNITY): Payer: Medicare HMO

## 2020-07-27 ENCOUNTER — Encounter (HOSPITAL_COMMUNITY): Payer: Self-pay | Admitting: *Deleted

## 2020-07-27 ENCOUNTER — Other Ambulatory Visit: Payer: Self-pay

## 2020-07-27 DIAGNOSIS — R55 Syncope and collapse: Secondary | ICD-10-CM | POA: Insufficient documentation

## 2020-07-27 DIAGNOSIS — F410 Panic disorder [episodic paroxysmal anxiety] without agoraphobia: Secondary | ICD-10-CM | POA: Insufficient documentation

## 2020-07-27 DIAGNOSIS — Z5321 Procedure and treatment not carried out due to patient leaving prior to being seen by health care provider: Secondary | ICD-10-CM | POA: Insufficient documentation

## 2020-07-27 DIAGNOSIS — R002 Palpitations: Secondary | ICD-10-CM | POA: Diagnosis present

## 2020-07-27 DIAGNOSIS — R69 Illness, unspecified: Secondary | ICD-10-CM | POA: Diagnosis not present

## 2020-07-27 LAB — CBC WITH DIFFERENTIAL/PLATELET
Abs Immature Granulocytes: 0.04 10*3/uL (ref 0.00–0.07)
Basophils Absolute: 0.1 10*3/uL (ref 0.0–0.1)
Basophils Relative: 1 %
Eosinophils Absolute: 0.1 10*3/uL (ref 0.0–0.5)
Eosinophils Relative: 1 %
HCT: 45.3 % (ref 39.0–52.0)
Hemoglobin: 15.2 g/dL (ref 13.0–17.0)
Immature Granulocytes: 1 %
Lymphocytes Relative: 21 %
Lymphs Abs: 1.7 10*3/uL (ref 0.7–4.0)
MCH: 30.5 pg (ref 26.0–34.0)
MCHC: 33.6 g/dL (ref 30.0–36.0)
MCV: 90.8 fL (ref 80.0–100.0)
Monocytes Absolute: 0.6 10*3/uL (ref 0.1–1.0)
Monocytes Relative: 7 %
Neutro Abs: 5.9 10*3/uL (ref 1.7–7.7)
Neutrophils Relative %: 69 %
Platelets: 301 10*3/uL (ref 150–400)
RBC: 4.99 MIL/uL (ref 4.22–5.81)
RDW: 12.3 % (ref 11.5–15.5)
WBC: 8.3 10*3/uL (ref 4.0–10.5)
nRBC: 0 % (ref 0.0–0.2)

## 2020-07-27 LAB — BASIC METABOLIC PANEL
Anion gap: 9 (ref 5–15)
BUN: 19 mg/dL (ref 8–23)
CO2: 23 mmol/L (ref 22–32)
Calcium: 9.4 mg/dL (ref 8.9–10.3)
Chloride: 105 mmol/L (ref 98–111)
Creatinine, Ser: 0.85 mg/dL (ref 0.61–1.24)
GFR, Estimated: >60 mL/min (ref >60)
Glucose, Bld: 195 mg/dL — ABNORMAL HIGH (ref 70–99)
Potassium: 3.8 mmol/L (ref 3.5–5.1)
Sodium: 137 mmol/L (ref 135–145)

## 2020-07-27 LAB — CBG MONITORING, ED: Glucose-Capillary: 186 mg/dL — ABNORMAL HIGH (ref 70–99)

## 2020-07-27 MED ORDER — LORAZEPAM 1 MG PO TABS
1.0000 mg | ORAL_TABLET | Freq: Once | ORAL | Status: AC
  Administered 2020-07-27: 1 mg via ORAL
  Filled 2020-07-27: qty 1

## 2020-07-27 NOTE — ED Provider Notes (Signed)
Emergency Medicine Provider Triage Evaluation Note  Stephen Herrera , a 65 y.o. male  was evaluated in triage.  Pt complains of anxiety, panic attack, syncope.  Per patient he has a history of syncopal episodes with anxiety attacks. He states that he has a therapy dog that helps him with this by putting pressure on his chest.  He states that he has not had to take any of his Xanax that he is prescribed for panic attacks for quite some time because his symptoms have been relatively well controlled.  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  He states that today the people who are going to buy his house texted him that they were pulling out of the agreement.  He states as he got the text he started having heart palpitations feeling short of breath and feeling very anxious.  He denies any chest pain.  He states that he was found by his friend on the ground unconscious.  He came to the ER for evaluation and because he does not have any medications to help with his symptoms here in the [place]   Review of Systems  Positive: Anxiety, short of breath, palpitations, syncope Negative: Chest pain, vomiting, vision changes  Physical Exam  BP (!) 171/108   Pulse (!) 101   Temp 98 F (36.7 C)   Resp (!) 22   SpO2 98%  Gen:   Awake, alert and oriented x3, able answer questions appropriately follow commands. Resp:  Normal effort, speaking full sentences, tachypneic MSK:   Moves extremities without difficulty  Other:  Lungs are clear to auscultation.  Mild tachycardia.  No murmurs rubs or gallops.  Medical Decision Making  Medically screening exam initiated at 3:57 PM.  Appropriate orders placed.  Stephen Herrera was informed that the remainder of the evaluation will be completed by another provider, this initial triage assessment does not replace that evaluation, and the importance of remaining in the ED until their evaluation is complete.  Patient is very  anxious appearing 65 year old male telling me that he is having symptoms related to anxiety/panic attack.  He is overall nontoxic-appearing and well-appearing however does appear quite anxious and jittery.  Was able to redirect patient and have him take some deep breaths which seems to have improved his symptoms this tachycardia actually resolved to 98--previously 106--with this.  Given his syncopal episode will obtain basic labs, chest x-ray, EKG, CBG.  Will also give 1 dose of Ativan 1 mg p.o.    Stephen Herrera, [place] 07/27/20 1601    Lorelle Gibbs, DO 07/27/20 1709
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
## 2020-07-28 ENCOUNTER — Ambulatory Visit (HOSPITAL_COMMUNITY)
Admission: RE | Admit: 2020-07-28 | Discharge: 2020-07-28 | Disposition: A | Discharge status: Home / Self Care | Payer: Medicare HMO | Attending: Psychiatry | Admitting: Psychiatry

## 2020-07-28 DIAGNOSIS — F411 Generalized anxiety disorder: Secondary | ICD-10-CM | POA: Diagnosis not present

## 2020-07-28 DIAGNOSIS — R69 Illness, unspecified: Secondary | ICD-10-CM | POA: Diagnosis not present

## 2020-07-28 DIAGNOSIS — F32A Depression, unspecified: Secondary | ICD-10-CM | POA: Diagnosis not present

## 2020-07-28 DIAGNOSIS — F419 Anxiety disorder, unspecified: Secondary | ICD-10-CM | POA: Diagnosis not present

## 2020-07-28 DIAGNOSIS — F4310 Post-traumatic stress disorder, unspecified: Secondary | ICD-10-CM | POA: Diagnosis not present
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
## 2020-08-21 DIAGNOSIS — Z79899 Other long term (current) drug therapy: Secondary | ICD-10-CM | POA: Diagnosis not present

## 2020-08-21 DIAGNOSIS — M4306 Spondylolysis, lumbar region: Secondary | ICD-10-CM | POA: Diagnosis not present

## 2020-08-21 DIAGNOSIS — G894 Chronic pain syndrome: Secondary | ICD-10-CM | POA: Diagnosis not present

## 2020-10-04 ENCOUNTER — Encounter: Payer: Self-pay | Admitting: Internal Medicine

## 2021-07-16 DIAGNOSIS — G894 Chronic pain syndrome: Secondary | ICD-10-CM | POA: Diagnosis not present

## 2021-07-16 DIAGNOSIS — E119 Type 2 diabetes mellitus without complications: Secondary | ICD-10-CM | POA: Diagnosis not present

## 2021-07-16 DIAGNOSIS — Z6827 Body mass index (BMI) 27.0-27.9, adult: Secondary | ICD-10-CM | POA: Diagnosis not present

## 2021-07-16 DIAGNOSIS — M4306 Spondylolysis, lumbar region: Secondary | ICD-10-CM | POA: Diagnosis not present

## 2021-07-16 DIAGNOSIS — Z79899 Other long term (current) drug therapy: Secondary | ICD-10-CM | POA: Diagnosis not present

## 2021-07-20 DIAGNOSIS — Z79899 Other long term (current) drug therapy: Secondary | ICD-10-CM | POA: Diagnosis not present

## 2021-07-28 DIAGNOSIS — K7581 Nonalcoholic steatohepatitis (NASH): Secondary | ICD-10-CM | POA: Diagnosis not present

## 2021-07-28 DIAGNOSIS — I10 Essential (primary) hypertension: Secondary | ICD-10-CM | POA: Diagnosis not present

## 2021-07-28 DIAGNOSIS — I2510 Atherosclerotic heart disease of native coronary artery without angina pectoris: Secondary | ICD-10-CM | POA: Diagnosis not present

## 2021-07-28 DIAGNOSIS — Z1331 Encounter for screening for depression: Secondary | ICD-10-CM | POA: Diagnosis not present

## 2021-07-28 DIAGNOSIS — R911 Solitary pulmonary nodule: Secondary | ICD-10-CM | POA: Diagnosis not present

## 2021-07-28 DIAGNOSIS — E782 Mixed hyperlipidemia: Secondary | ICD-10-CM | POA: Diagnosis not present

## 2021-07-28 DIAGNOSIS — Z125 Encounter for screening for malignant neoplasm of prostate: Secondary | ICD-10-CM | POA: Diagnosis not present

## 2021-07-28 DIAGNOSIS — E119 Type 2 diabetes mellitus without complications: Secondary | ICD-10-CM | POA: Diagnosis not present

## 2021-07-28 DIAGNOSIS — Z0000 Encounter for general adult medical examination without abnormal findings: Secondary | ICD-10-CM | POA: Diagnosis not present

## 2021-07-28 DIAGNOSIS — I700 Atherosclerosis of aorta: Secondary | ICD-10-CM | POA: Diagnosis not present

## 2021-07-29 DIAGNOSIS — H5203 Hypermetropia, bilateral: Secondary | ICD-10-CM | POA: Diagnosis not present

## 2021-07-31 DIAGNOSIS — Z0100 Encounter for examination of eyes and vision without abnormal findings: Secondary | ICD-10-CM | POA: Diagnosis not present

## 2021-08-02 ENCOUNTER — Other Ambulatory Visit: Payer: Self-pay | Admitting: Internal Medicine

## 2021-08-02 DIAGNOSIS — R911 Solitary pulmonary nodule: Secondary | ICD-10-CM

## 2021-08-03 DIAGNOSIS — R911 Solitary pulmonary nodule: Secondary | ICD-10-CM | POA: Diagnosis not present

## 2021-08-03 DIAGNOSIS — Z122 Encounter for screening for malignant neoplasm of respiratory organs: Secondary | ICD-10-CM | POA: Diagnosis not present

## 2021-08-06 DIAGNOSIS — G894 Chronic pain syndrome: Secondary | ICD-10-CM | POA: Diagnosis not present

## 2021-08-06 DIAGNOSIS — E119 Type 2 diabetes mellitus without complications: Secondary | ICD-10-CM | POA: Diagnosis not present

## 2021-08-06 DIAGNOSIS — M4306 Spondylolysis, lumbar region: Secondary | ICD-10-CM | POA: Diagnosis not present

## 2021-08-06 DIAGNOSIS — Z6827 Body mass index (BMI) 27.0-27.9, adult: Secondary | ICD-10-CM | POA: Diagnosis not present

## 2021-08-06 DIAGNOSIS — Z79899 Other long term (current) drug therapy: Secondary | ICD-10-CM | POA: Diagnosis not present

## 2021-08-12 DIAGNOSIS — I10 Essential (primary) hypertension: Secondary | ICD-10-CM | POA: Diagnosis not present

## 2021-09-06 DIAGNOSIS — Z79899 Other long term (current) drug therapy: Secondary | ICD-10-CM | POA: Diagnosis not present

## 2021-09-06 DIAGNOSIS — M4306 Spondylolysis, lumbar region: Secondary | ICD-10-CM | POA: Diagnosis not present

## 2021-09-06 DIAGNOSIS — G894 Chronic pain syndrome: Secondary | ICD-10-CM | POA: Diagnosis not present

## 2021-09-06 DIAGNOSIS — E119 Type 2 diabetes mellitus without complications: Secondary | ICD-10-CM | POA: Diagnosis not present

## 2021-09-06 DIAGNOSIS — Z6828 Body mass index (BMI) 28.0-28.9, adult: Secondary | ICD-10-CM | POA: Diagnosis not present

## 2021-09-08 DIAGNOSIS — Z79899 Other long term (current) drug therapy: Secondary | ICD-10-CM | POA: Diagnosis not present

## 2023-11-10 DIAGNOSIS — H52223 Regular astigmatism, bilateral: Secondary | ICD-10-CM | POA: Diagnosis not present

## 2023-11-10 DIAGNOSIS — H524 Presbyopia: Secondary | ICD-10-CM | POA: Diagnosis not present

## 2023-11-17 DIAGNOSIS — Z0000 Encounter for general adult medical examination without abnormal findings: Secondary | ICD-10-CM | POA: Diagnosis not present

## 2023-11-17 DIAGNOSIS — Z6829 Body mass index (BMI) 29.0-29.9, adult: Secondary | ICD-10-CM | POA: Diagnosis not present

## 2023-11-17 DIAGNOSIS — R5383 Other fatigue: Secondary | ICD-10-CM | POA: Diagnosis not present

## 2023-11-17 DIAGNOSIS — Z125 Encounter for screening for malignant neoplasm of prostate: Secondary | ICD-10-CM | POA: Diagnosis not present

## 2023-11-17 DIAGNOSIS — Z131 Encounter for screening for diabetes mellitus: Secondary | ICD-10-CM | POA: Diagnosis not present

## 2023-11-17 DIAGNOSIS — E119 Type 2 diabetes mellitus without complications: Secondary | ICD-10-CM | POA: Diagnosis not present

## 2023-11-17 DIAGNOSIS — Z114 Encounter for screening for human immunodeficiency virus [HIV]: Secondary | ICD-10-CM | POA: Diagnosis not present

## 2023-11-17 DIAGNOSIS — E559 Vitamin D deficiency, unspecified: Secondary | ICD-10-CM | POA: Diagnosis not present

## 2023-11-17 DIAGNOSIS — Z13220 Encounter for screening for lipoid disorders: Secondary | ICD-10-CM | POA: Diagnosis not present

## 2023-11-17 DIAGNOSIS — R300 Dysuria: Secondary | ICD-10-CM | POA: Diagnosis not present
